# Patient Record
Sex: Female | Born: 1990 | Race: Black or African American | Hispanic: No | Marital: Married | Smoking: Never smoker
Health system: Southern US, Community
[De-identification: ages and names within clinical notes are randomized; demographics above are authoritative.]

## PROBLEM LIST (undated history)

## (undated) DIAGNOSIS — O4690 Antepartum hemorrhage, unspecified, unspecified trimester: Secondary | ICD-10-CM

## (undated) DIAGNOSIS — F329 Major depressive disorder, single episode, unspecified: Secondary | ICD-10-CM

## (undated) DIAGNOSIS — O219 Vomiting of pregnancy, unspecified: Secondary | ICD-10-CM

## (undated) DIAGNOSIS — F419 Anxiety disorder, unspecified: Secondary | ICD-10-CM

## (undated) DIAGNOSIS — O200 Threatened abortion: Secondary | ICD-10-CM

## (undated) DIAGNOSIS — T7840XA Allergy, unspecified, initial encounter: Secondary | ICD-10-CM

## (undated) HISTORY — DX: Anxiety disorder, unspecified: F41.9

## (undated) HISTORY — DX: Major depressive disorder, single episode, unspecified: F32.9

## (undated) HISTORY — DX: Threatened abortion: O20.0

## (undated) HISTORY — DX: Antepartum hemorrhage, unspecified, unspecified trimester: O46.90

## (undated) HISTORY — DX: Vomiting of pregnancy, unspecified: O21.9

## (undated) HISTORY — PX: MOUTH SURGERY: SHX715

## (undated) HISTORY — DX: Allergy, unspecified, initial encounter: T78.40XA

---

## 2011-03-08 DIAGNOSIS — F32A Depression, unspecified: Secondary | ICD-10-CM

## 2011-03-08 DIAGNOSIS — F419 Anxiety disorder, unspecified: Secondary | ICD-10-CM

## 2011-03-08 HISTORY — DX: Anxiety disorder, unspecified: F41.9

## 2011-03-08 HISTORY — DX: Depression, unspecified: F32.A

## 2013-05-23 ENCOUNTER — Encounter: Payer: Self-pay | Admitting: *Deleted

## 2013-05-26 ENCOUNTER — Other Ambulatory Visit: Payer: Self-pay | Admitting: Obstetrics and Gynecology

## 2013-06-04 ENCOUNTER — Encounter: Payer: Self-pay | Admitting: Obstetrics and Gynecology

## 2013-06-04 ENCOUNTER — Ambulatory Visit (INDEPENDENT_AMBULATORY_CARE_PROVIDER_SITE_OTHER): Payer: BC Managed Care – PPO | Admitting: Obstetrics and Gynecology

## 2013-06-04 ENCOUNTER — Other Ambulatory Visit (HOSPITAL_COMMUNITY)
Admission: RE | Admit: 2013-06-04 | Discharge: 2013-06-04 | Disposition: A | Discharge status: Home / Self Care | Payer: BC Managed Care – PPO | Source: Ambulatory Visit | Attending: Obstetrics and Gynecology | Admitting: Obstetrics and Gynecology

## 2013-06-04 ENCOUNTER — Ambulatory Visit (INDEPENDENT_AMBULATORY_CARE_PROVIDER_SITE_OTHER): Payer: BC Managed Care – PPO | Admitting: Family Medicine

## 2013-06-04 ENCOUNTER — Encounter: Payer: Self-pay | Admitting: Family Medicine

## 2013-06-04 VITALS — BP 110/70 | Ht 67.0 in | Wt 120.0 lb

## 2013-06-04 VITALS — BP 108/58 | HR 86 | Temp 98.5°F | Resp 14 | Ht 67.0 in | Wt 120.0 lb

## 2013-06-04 DIAGNOSIS — Z23 Encounter for immunization: Secondary | ICD-10-CM

## 2013-06-04 DIAGNOSIS — Z0000 Encounter for general adult medical examination without abnormal findings: Secondary | ICD-10-CM | POA: Insufficient documentation

## 2013-06-04 DIAGNOSIS — Z01419 Encounter for gynecological examination (general) (routine) without abnormal findings: Secondary | ICD-10-CM

## 2013-06-04 DIAGNOSIS — Z3200 Encounter for pregnancy test, result unknown: Secondary | ICD-10-CM

## 2013-06-04 DIAGNOSIS — Z3202 Encounter for pregnancy test, result negative: Secondary | ICD-10-CM

## 2013-06-04 LAB — POCT URINE PREGNANCY: Preg Test, Ur: NEGATIVE

## 2013-06-04 MED ORDER — NORETHIN ACE-ETH ESTRAD-FE 1-20 MG-MCG(24) PO TABS: 1.0000 | ORAL_TABLET | Freq: Every day | ORAL | Status: DC

## 2013-06-04 NOTE — Assessment & Plan Note (Signed)
Healthy young female. She'll followup with GYN this afternoon. I will go ahead and give her the third hepatitis vaccine since this was stated that she needed from the [employer]. We will obtain records from her previous physicians [place] in [place] I do not see any evidence of any liver abnormalities she does not have any autoimmune disease so not sure why she continues to need repeat vaccinations. I will see if maybe she does have some low titers when she entered the [employer].

## 2013-06-04 NOTE — Progress Notes (Signed)
Patient  Subjective:     Kelly Villarreal is a 23 y.o. female here for a routine exam.  Current complaints: needs BControl.  Personal health questionnaire reviewed: not asked.   Gynecologic History Patient's last menstrual period was 05/24/2013. Contraception: none Last Pap: 2014. Results were: normal   Obstetric History OB History  No data available       Review of Systems  Review of Systems   negative overall   Objective:    Physical Exam  Vitals reviewed. Constitutional: She is oriented to person, place, and time. She appears well-developed and well-nourished.  HENT:  Head: Normocephalic and atraumatic.        Right Ear: External ear normal.  Left Ear: External ear normal.  Nose: Nose normal.  Mouth/Throat: Oropharynx is clear and moist.  Eyes: Conjunctivae and EOM are normal. Pupils are equal, round, and reactive to light. Right eye exhibits no discharge. Left eye exhibits no discharge. No scleral icterus.  Neck: Normal range of motion. Neck supple. No tracheal deviation present. No thyromegaly present.  GI: Soft. Bowel sounds are normal. She exhibits no distension and no mass. There is no tenderness. There is no rebound and no guarding.  Genitourinary:       Vulva is normal without lesions Vagina is pink moist without discharge Cervix normal in appearance and pap is done Uterus is normal size shape and contour  Musculoskeletal: Normal range of motion. She exhibits no edema and no tenderness.  Neurological: She is alert and oriented to person, place, and time. She has normal reflexes. She displays normal reflexes. No cranial nerve deficit. She exhibits normal muscle tone. Coordination normal.  Skin: Skin is warm and dry. No rash noted. No erythema. No pallor.  Psychiatric: She has a normal mood and affect. Her behavior is normal. Judgment and thought content normal.       Assessment:    Healthy female exam.   contr management Plan:    Contraception: OCP  (estrogen/progesterone).  lo estrin 1:20 Subjective:

## 2013-06-04 NOTE — Progress Notes (Signed)
Patient ID: Kelly Villarreal, female   DOB: [birth year], 23 y.o.   MRN: [id number]   Subjective:    Patient ID: Kelly Villarreal, female    DOB: [birth year], 23 y.o.   MRN: [id number]  Patient presents for New Patient CPE- no PAP  patient here to establish care for new patient physical exam. [place] recently moved from [place] with her boyfriend. She was in the [employer] in the [employer] for couple years she is out now. She does state when she was in there she had to have repeat immunizations including hepatitis B when she got out she had to restart the series but she's not sure why. She is due for her third vaccine which should been given in early May. She does have some labs from 2014 which is a normal cholesterol panel as well as immunity to measles mumps and rubella she also had an STD check for gonorrhea chlamydia at that time. CBC was also normal and metabolic panel including liver function test was normal. She has an appointment scheduled with Dr. Emelda FearFerguson [facility] for her Pap smear at this afternoon    Review Of Systems:  GEN- denies fatigue, fever, weight loss,weakness, recent illness HEENT- denies eye drainage, change in vision, nasal discharge, CVS- denies chest pain, palpitations RESP- denies SOB, cough, wheeze ABD- denies N/V, change in stools, abd pain GU- denies dysuria, hematuria, dribbling, incontinence MSK- denies joint pain, muscle aches, injury Neuro- denies headache, dizziness, syncope, seizure activity       Objective:    BP 108/58  Pulse 86  Temp(Src) 98.5 F (36.9 C) (Oral)  Resp 14  Ht 5\' 7"  (1.702 m)  Wt 120 lb (54.432 kg)  BMI 18.79 kg/m2  LMP 05/24/2013 GEN- NAD, alert and oriented x3 HEENT- PERRL, EOMI, non injected sclera, pink conjunctiva, MMM, oropharynx clear, missing upper front teeth, implants noted, TM clear bilat Neck- Supple, no thyromegaly CVS- RRR, no murmur RESP-CTAB ABD-NABS,soft,NT,ND GU- Deferred to GYN  EXT- No edema Psych-  normal affect and mood Pulses- Radial, DP- 2+        Assessment & Plan:      Problem List Items Addressed This Visit   None    Visit Diagnoses   Need for prophylactic vaccination and inoculation against viral hepatitis    -  Primary    Relevant Orders       Hepatitis B vaccine adult IM (Completed)       Note: This dictation was prepared with Dragon dictation along with smaller phrase technology. Any transcriptional errors that result from this process are unintentional.

## 2013-06-04 NOTE — Patient Instructions (Signed)
[facility] [place] [place] Hepatitis B given I recommend eye visit once a year I recommend dental visit every 6 months Goal is to  Exercise 30 minutes 5 days a week F/U as needed or in 1 year

## 2013-06-24 ENCOUNTER — Telehealth: Payer: Self-pay | Admitting: Obstetrics and Gynecology

## 2013-06-24 NOTE — Telephone Encounter (Signed)
Spoke with pt. Pt states she was put on birth control pills(Loestrin 24 Fe) to help control periods. The birth control pill causes nausea and vomiting. Has been on pill x 4 weeks. Pt having pain in her abdomen. She is requesting a different birth control pill and some pain meds. Please advise. Thanks!!! [employer]

## 2013-06-26 NOTE — Telephone Encounter (Signed)
Appointment made with Dr. Emelda Fear, 06/27/2013.

## 2013-06-27 ENCOUNTER — Ambulatory Visit: Payer: BC Managed Care – PPO | Admitting: Obstetrics and Gynecology

## 2013-06-30 ENCOUNTER — Encounter: Payer: Self-pay | Admitting: *Deleted

## 2013-07-01 ENCOUNTER — Encounter: Payer: Self-pay | Admitting: Obstetrics and Gynecology

## 2013-07-01 ENCOUNTER — Ambulatory Visit (INDEPENDENT_AMBULATORY_CARE_PROVIDER_SITE_OTHER): Payer: BC Managed Care – PPO | Admitting: Obstetrics and Gynecology

## 2013-07-01 VITALS — BP 100/60 | Ht 67.0 in | Wt 122.0 lb

## 2013-07-01 DIAGNOSIS — Z309 Encounter for contraceptive management, unspecified: Secondary | ICD-10-CM

## 2013-07-01 DIAGNOSIS — Z3049 Encounter for surveillance of other contraceptives: Secondary | ICD-10-CM

## 2013-07-01 MED ORDER — NORETHINDRONE 0.35 MG PO TABS: 1.0000 | ORAL_TABLET | Freq: Every day | ORAL | Status: DC

## 2013-07-01 NOTE — Progress Notes (Signed)
Subjective:     Patient ID: Kelly Villarreal, female   DOB: November 01, 1990, 23 y.o.   MRN: [id number]  HPI [place]/o nausea 4 hr after taking ocp, beginning 2nd day.   Similar to reaction to microgestin.    Review of Systems Was able to use pill in 2008, did fine, but after oral surgeries in [phone], was unable to tolerate pills.    Objective:   Physical Exam     Assessment:     Nausea on Loestrin 1/20 Switch to Micronor POP.      Plan:     micronor

## 2013-07-30 ENCOUNTER — Ambulatory Visit: Payer: BC Managed Care – PPO | Admitting: Obstetrics and Gynecology

## 2014-08-05 ENCOUNTER — Encounter (HOSPITAL_COMMUNITY): Payer: Self-pay | Admitting: Emergency Medicine

## 2014-08-05 ENCOUNTER — Emergency Department (HOSPITAL_COMMUNITY): Payer: Medicaid Other

## 2014-08-05 ENCOUNTER — Emergency Department (HOSPITAL_COMMUNITY)
Admission: EM | Admit: 2014-08-05 | Discharge: 2014-08-06 | Disposition: A | Discharge status: Home / Self Care | Payer: Medicaid Other | Attending: Emergency Medicine | Admitting: Emergency Medicine

## 2014-08-05 DIAGNOSIS — O209 Hemorrhage in early pregnancy, unspecified: Secondary | ICD-10-CM | POA: Diagnosis present

## 2014-08-05 DIAGNOSIS — R51 Headache: Secondary | ICD-10-CM | POA: Insufficient documentation

## 2014-08-05 DIAGNOSIS — Z3A01 Less than 8 weeks gestation of pregnancy: Secondary | ICD-10-CM | POA: Insufficient documentation

## 2014-08-05 DIAGNOSIS — O200 Threatened abortion: Secondary | ICD-10-CM | POA: Diagnosis not present

## 2014-08-05 DIAGNOSIS — O210 Mild hyperemesis gravidarum: Secondary | ICD-10-CM | POA: Insufficient documentation

## 2014-08-05 DIAGNOSIS — O9989 Other specified diseases and conditions complicating pregnancy, childbirth and the puerperium: Secondary | ICD-10-CM | POA: Diagnosis not present

## 2014-08-05 DIAGNOSIS — Z3490 Encounter for supervision of normal pregnancy, unspecified, unspecified trimester: Secondary | ICD-10-CM

## 2014-08-05 LAB — BASIC METABOLIC PANEL
ANION GAP: 7 (ref 5–15)
BUN: 11 mg/dL (ref 6–20)
CO2: 23 mmol/L (ref 22–32)
CREATININE: 0.61 mg/dL (ref 0.44–1.00)
Calcium: 8.9 mg/dL (ref 8.9–10.3)
Chloride: 108 mmol/L (ref 101–111)
GFR calc Af Amer: >60 mL/min (ref >60)
GLUCOSE: 87 mg/dL (ref 65–99)
POTASSIUM: 3.8 mmol/L (ref 3.5–5.1)
SODIUM: 138 mmol/L (ref 135–145)

## 2014-08-05 LAB — URINALYSIS, ROUTINE W REFLEX MICROSCOPIC
Bilirubin Urine: NEGATIVE
GLUCOSE, UA: NEGATIVE mg/dL
KETONES UR: 40 mg/dL — AB
Leukocytes, UA: NEGATIVE
Nitrite: POSITIVE — AB
PROTEIN: NEGATIVE mg/dL
Urobilinogen, UA: 0.2 mg/dL (ref 0.0–1.0)
pH: 6 (ref 5.0–8.0)

## 2014-08-05 LAB — CBC WITH DIFFERENTIAL/PLATELET
BASOS ABS: 0 10*3/uL (ref 0.0–0.1)
BASOS PCT: 0 % (ref 0–1)
Eosinophils Absolute: 0 10*3/uL (ref 0.0–0.7)
Eosinophils Relative: 0 % (ref 0–5)
HCT: 37.6 % (ref 36.0–46.0)
Hemoglobin: 12.6 g/dL (ref 12.0–15.0)
LYMPHS PCT: 13 % (ref 12–46)
Lymphs Abs: 0.6 10*3/uL — ABNORMAL LOW (ref 0.7–4.0)
MCH: 31.2 pg (ref 26.0–34.0)
MCHC: 33.5 g/dL (ref 30.0–36.0)
MCV: 93.1 fL (ref 78.0–100.0)
MONO ABS: 1.1 10*3/uL — AB (ref 0.1–1.0)
Monocytes Relative: 22 % — ABNORMAL HIGH (ref 3–12)
Neutro Abs: 3.2 10*3/uL (ref 1.7–7.7)
Neutrophils Relative %: 65 % (ref 43–77)
Platelets: 214 10*3/uL (ref 150–400)
RBC: 4.04 MIL/uL (ref 3.87–5.11)
RDW: 13.6 % (ref 11.5–15.5)
WBC: 4.9 10*3/uL (ref 4.0–10.5)

## 2014-08-05 LAB — HCG, QUANTITATIVE, PREGNANCY: hCG, Beta Chain, Quant, S: 48708 m[IU]/mL — ABNORMAL HIGH (ref <5)

## 2014-08-05 LAB — URINE MICROSCOPIC-ADD ON

## 2014-08-05 LAB — PREGNANCY, URINE: Preg Test, Ur: POSITIVE — AB

## 2014-08-05 LAB — ABO/RH: ABO/RH(D): O NEG

## 2014-08-05 MED ORDER — SODIUM CHLORIDE 0.9 % IV BOLUS (SEPSIS)
1000.0000 mL | Freq: Once | INTRAVENOUS | Status: AC
  Administered 2014-08-05: 1000 mL via INTRAVENOUS

## 2014-08-05 MED ORDER — ONDANSETRON HCL 4 MG/2ML IJ SOLN
4.0000 mg | Freq: Once | INTRAMUSCULAR | Status: AC
  Administered 2014-08-05: 4 mg via INTRAVENOUS
  Filled 2014-08-05: qty 2

## 2014-08-05 MED ORDER — RHO D IMMUNE GLOBULIN 1500 UNIT/2ML IJ SOSY
300.0000 ug | PREFILLED_SYRINGE | Freq: Once | INTRAMUSCULAR | Status: AC
  Administered 2014-08-05: 300 ug via INTRAMUSCULAR

## 2014-08-05 MED ORDER — SODIUM CHLORIDE 0.9 % IV SOLN
INTRAVENOUS | Status: DC
  Administered 2014-08-05: 18:00:00 via INTRAVENOUS

## 2014-08-05 NOTE — ED Notes (Signed)
Pt reports headache and emesis. Pt states she normally has headaches around her menses but this headache has not eased up.

## 2014-08-05 NOTE — ED Notes (Signed)
Patient transported to Ultrasound 

## 2014-08-06 LAB — RH IG WORKUP (INCLUDES ABO/RH)
ABO/RH(D): O NEG
Antibody Screen: NEGATIVE
Gestational Age(Wks): 5
UNIT DIVISION: 0

## 2014-08-06 MED ORDER — PRENATAL VITAMINS 28-0.8 MG PO TABS: 1.0000 | ORAL_TABLET | Freq: Every day | ORAL | Status: DC

## 2014-08-06 MED ORDER — PROMETHAZINE HCL 25 MG PO TABS: 25.0000 mg | ORAL_TABLET | Freq: Four times a day (QID) | ORAL | Status: DC | PRN

## 2014-08-06 NOTE — Discharge Instructions (Signed)
Take the prenatal vitamins as directed. Take the Phenergan as needed for the nausea and vomiting. Very important to follow-up with OB/GYN. Ultrasound done here today is equivocal for ectopic pregnancy. Also it is possible that based on your symptoms with the vaginal bleeding this could be a threatened miscarriage. Follow-up with OB/GYN and repeat the quantitative hormone pregnancy number will help sort things out. Return for any new or worse symptoms.

## 2014-08-06 NOTE — ED Provider Notes (Signed)
CSN: [id number]     Arrival date & time 08/05/14  1555 History   First MD Initiated Contact with Patient 08/05/14 1609     Chief Complaint  Patient presents with  . Emesis  . Headache     (Consider location/radiation/quality/duration/timing/severity/associated sxs/prior Treatment) Patient is a 24 y.o. female presenting with vomiting and headaches. The history is provided by the patient.  Emesis Associated symptoms: abdominal pain and headaches   Headache Associated symptoms: abdominal pain, nausea and vomiting   Associated symptoms: no back pain, no congestion and no fever    patient believes she's on last measured. Currently. States that she normally gets some nausea and vomiting and some headaches during her menses this is lasted a few days longer than usual. Also associated with some discomfort in the lower part of the abdomen which is common during her menses but also seems to be a little more severe. No fevers.  Past Medical History  Diagnosis Date  . Allergy     seasonal  . Anxiety 03/08/2011  . Depression 03/08/2011   Past Surgical History  Procedure Laterality Date  . Mouth surgery     Family History  Problem Relation Age of Onset  . Hypertension Father   . Heart disease Maternal Grandmother   . Diabetes Maternal Grandfather   . Hypertension Maternal Grandfather   . Glaucoma Paternal Grandmother    History  Substance Use Topics  . Smoking status: Never Smoker   . Smokeless tobacco: Never Used  . Alcohol Use: No   OB History    No data available     Review of Systems  Constitutional: Negative for fever.  HENT: Negative for congestion.   Eyes: Negative for redness.  Gastrointestinal: Positive for nausea, vomiting and abdominal pain.  Genitourinary: Positive for vaginal bleeding. Negative for dysuria and vaginal discharge.  Musculoskeletal: Negative for back pain.  Skin: Negative for rash.  Neurological: Positive for headaches.  Psychiatric/Behavioral:  Negative for confusion.      Allergies  Review of patient's allergies indicates no known allergies.  Home Medications   Prior to Admission medications   Medication Sig Start Date End Date Taking? Authorizing Provider  Prenatal Vit-Fe Fumarate-FA (PRENATAL VITAMINS) 28-0.8 MG TABS Take 1 tablet by mouth daily. 08/06/14   Vanetta Mulders, MD  promethazine (PHENERGAN) 25 MG tablet Take 1 tablet (25 mg total) by mouth every 6 (six) hours as needed. 08/06/14   Vanetta Mulders, MD   BP 115/75 mmHg  Pulse 92  Temp(Src) 97.9 F (36.6 C) (Oral)  Resp 18  Ht 5\' 7"  (1.702 m)  Wt 120 lb (54.432 kg)  BMI 18.79 kg/m2  SpO2 100%  LMP 08/05/2014 Physical Exam  Constitutional: She is oriented to person, place, and time. She appears well-developed and well-nourished.  HENT:  Head: Normocephalic and atraumatic.  Eyes: Conjunctivae and EOM are normal. Pupils are equal, round, and reactive to light.  Cardiovascular: Normal rate, regular rhythm and normal heart sounds.   No murmur heard. Pulmonary/Chest: Effort normal and breath sounds normal. No respiratory distress.  Abdominal: Bowel sounds are normal. There is no guarding.  Slight tenderness bilateral lower quadrants. Right greater than left.  Genitourinary:  Patient with vaginal bleeding.  Musculoskeletal: Normal range of motion.  Neurological: She is alert and oriented to person, place, and time. No cranial nerve deficit. She exhibits normal muscle tone. Coordination normal.  Skin: Skin is warm. No rash noted.  Nursing note and vitals reviewed.   ED Course  Procedures (  including critical care time) Labs Review Labs Reviewed  URINALYSIS, ROUTINE W REFLEX MICROSCOPIC (NOT AT [facility]) - Abnormal; Notable for the following:    Specific Gravity, Urine >1.030 (*)    Hgb urine dipstick TRACE (*)    Ketones, ur 40 (*)    Nitrite POSITIVE (*)    All other components within normal limits  PREGNANCY, URINE - Abnormal; Notable for the following:     Preg Test, Ur POSITIVE (*)    All other components within normal limits  CBC WITH DIFFERENTIAL/PLATELET - Abnormal; Notable for the following:    Lymphs Abs 0.6 (*)    Monocytes Relative 22 (*)    Monocytes Absolute 1.1 (*)    All other components within normal limits  HCG, QUANTITATIVE, PREGNANCY - Abnormal; Notable for the following:    hCG, Beta Chain, Quant, S 09604 (*)    All other components within normal limits  URINE MICROSCOPIC-ADD ON - Abnormal; Notable for the following:    Bacteria, UA MANY (*)    All other components within normal limits  BASIC METABOLIC PANEL  ABO/RH  RH IG WORKUP (INCLUDES ABO/RH)   Results for orders placed or performed during the hospital encounter of 08/05/14  Urinalysis, Routine w reflex microscopic (not at [facility])  Result Value Ref Range   Color, Urine YELLOW YELLOW   APPearance CLEAR CLEAR   Specific Gravity, Urine >1.030 (H) 1.005 - 1.030   pH 6.0 5.0 - 8.0   Glucose, UA NEGATIVE NEGATIVE mg/dL   Hgb urine dipstick TRACE (A) NEGATIVE   Bilirubin Urine NEGATIVE NEGATIVE   Ketones, ur 40 (A) NEGATIVE mg/dL   Protein, ur NEGATIVE NEGATIVE mg/dL   Urobilinogen, UA 0.2 0.0 - 1.0 mg/dL   Nitrite POSITIVE (A) NEGATIVE   Leukocytes, UA NEGATIVE NEGATIVE  Pregnancy, urine  Result Value Ref Range   Preg Test, Ur POSITIVE (A) NEGATIVE  CBC with Differential/Platelet  Result Value Ref Range   WBC 4.9 4.0 - 10.5 K/uL   RBC 4.04 3.87 - 5.11 MIL/uL   Hemoglobin 12.6 12.0 - 15.0 g/dL   HCT 54.0 98.1 - 19.1 %   MCV 93.1 78.0 - 100.0 fL   MCH 31.2 26.0 - 34.0 pg   MCHC 33.5 30.0 - 36.0 g/dL   RDW 47.8 29.5 - 62.1 %   Platelets 214 150 - 400 K/uL   Neutrophils Relative % 65 43 - 77 %   Neutro Abs 3.2 1.7 - 7.7 K/uL   Lymphocytes Relative 13 12 - 46 %   Lymphs Abs 0.6 (L) 0.7 - 4.0 K/uL   Monocytes Relative 22 (H) 3 - 12 %   Monocytes Absolute 1.1 (H) 0.1 - 1.0 K/uL   Eosinophils Relative 0 0 - 5 %   Eosinophils Absolute 0.0 0.0 - 0.7 K/uL    Basophils Relative 0 0 - 1 %   Basophils Absolute 0.0 0.0 - 0.1 K/uL  Basic metabolic panel  Result Value Ref Range   Sodium 138 135 - 145 mmol/L   Potassium 3.8 3.5 - 5.1 mmol/L   Chloride 108 101 - 111 mmol/L   CO2 23 22 - 32 mmol/L   Glucose, Bld 87 65 - 99 mg/dL   BUN 11 6 - 20 mg/dL   Creatinine, Ser 3.08 0.44 - 1.00 mg/dL   Calcium 8.9 8.9 - 65.7 mg/dL   GFR calc non Af Amer >60 >60 mL/min   GFR calc Af Amer >60 >60 mL/min   Anion gap 7 5 -  15  hCG, quantitative, pregnancy  Result Value Ref Range   hCG, Beta Chain, Quant, S 48708 (H) <5 mIU/mL  Urine microscopic-add on  Result Value Ref Range   Squamous Epithelial / LPF RARE RARE   WBC, UA 3-6 <3 WBC/hpf   RBC / HPF 3-6 <3 RBC/hpf   Bacteria, UA MANY (A) RARE  ABO/Rh  Result Value Ref Range   ABO/RH(D) O NEG   Rh IG workup (includes ABO/Rh)  Result Value Ref Range   Gestational Age(Wks) 5 12    ABO/RH(D) O NEG    Antibody Screen NEG    Unit Number [id number]    Blood Component Type RHIG    Unit division 00    Status of Unit ISSUED    Transfusion Status OK TO TRANSFUSE      Imaging Review US Ob Comp Less 14 Wks  08/05/2014   CLINICAL DATA:  24 year old pregnant female with pelvic pain  EXAM: OBSTETRIC <14 WK [place] AND TRANSVAGINAL OB US  TECHNIQUE: Both transabdominal and transvaginal ultrasound examinations were performed for complete evaluation of the gestation as well as the maternal uterus, adnexal regions, and pelvic cul-de-sac. Transvaginal technique was performed to assess early pregnancy.  COMPARISON:  None.  FINDINGS: The uterus is anteverted. A cystic structure is noted within the uterus. No yolk sac or fetal pole identified. This may represent an early gestational sac. A pseudogestation of an ectopic pregnancy is not excluded. Correlation with clinical exam and follow-up with serial HCG levels and ultrasound recommended.  If this cystic structure is a true gestational sac, the estimated gestational age based  on mean sac diameter of 2.1 cm is 7 weeks, 0 days.  Maternal uterus/adnexae: The maternal ovaries appear unremarkable. The right ovary measures 2.9 x 1.4 x 2.8 cm and the left ovary measures 2.7 x 1.6 x 2.8 cm. Small free fluid noted within the pelvis. Set  IMPRESSION: An intrauterine cystic structure as described above. Correlation with clinical exam and follow-up with serial HCG levels and ultrasound recommended.   Electronically Signed   By: Elgie Collard M.D.   On: 08/05/2014 22:50   US Ob Transvaginal  08/05/2014   CLINICAL DATA:  24 year old pregnant female with pelvic pain  EXAM: OBSTETRIC <14 WK [place] AND TRANSVAGINAL OB US  TECHNIQUE: Both transabdominal and transvaginal ultrasound examinations were performed for complete evaluation of the gestation as well as the maternal uterus, adnexal regions, and pelvic cul-de-sac. Transvaginal technique was performed to assess early pregnancy.  COMPARISON:  None.  FINDINGS: The uterus is anteverted. A cystic structure is noted within the uterus. No yolk sac or fetal pole identified. This may represent an early gestational sac. A pseudogestation of an ectopic pregnancy is not excluded. Correlation with clinical exam and follow-up with serial HCG levels and ultrasound recommended.  If this cystic structure is a true gestational sac, the estimated gestational age based on mean sac diameter of 2.1 cm is 7 weeks, 0 days.  Maternal uterus/adnexae: The maternal ovaries appear unremarkable. The right ovary measures 2.9 x 1.4 x 2.8 cm and the left ovary measures 2.7 x 1.6 x 2.8 cm. Small free fluid noted within the pelvis. Set  IMPRESSION: An intrauterine cystic structure as described above. Correlation with clinical exam and follow-up with serial HCG levels and ultrasound recommended.   Electronically Signed   By: Elgie Collard M.D.   On: 08/05/2014 22:50     EKG Interpretation None      MDM   Final diagnoses:  Pregnancy  Threatened miscarriage in early  pregnancy   Patient presented for vaginal bleeding also complaint of headache and vomiting. Patient with four-day complaint of lower abdominal pain. Last menstrual period is now no back pain no fever as stated some nausea and vomiting no dysuria pain is in the lower part of the abdomen.  Workup shows early pregnancy not able to confirm whether this is ectopic or not also may be a threatened miscarriage. Follow-up with OB/GYN and repeat quantitative hCGs will help sort things out. Patient nontoxic no acute distress. Will treat with antinausea medicine patient was Rh- and she was given [place].  No obvious urinary tract infection urine culture sent.  Vanetta Mulders, MD 08/06/14 [phone]

## 2014-08-10 ENCOUNTER — Other Ambulatory Visit: Payer: Self-pay | Admitting: Adult Health

## 2014-08-10 ENCOUNTER — Ambulatory Visit (INDEPENDENT_AMBULATORY_CARE_PROVIDER_SITE_OTHER): Payer: Medicaid Other | Admitting: Adult Health

## 2014-08-10 ENCOUNTER — Encounter: Payer: Self-pay | Admitting: Obstetrics and Gynecology

## 2014-08-10 ENCOUNTER — Encounter: Payer: Self-pay | Admitting: Adult Health

## 2014-08-10 VITALS — BP 118/70 | HR 70 | Ht 67.0 in | Wt 119.0 lb

## 2014-08-10 DIAGNOSIS — O4691 Antepartum hemorrhage, unspecified, first trimester: Secondary | ICD-10-CM | POA: Diagnosis not present

## 2014-08-10 DIAGNOSIS — O3680X0 Pregnancy with inconclusive fetal viability, not applicable or unspecified: Secondary | ICD-10-CM

## 2014-08-10 DIAGNOSIS — O219 Vomiting of pregnancy, unspecified: Secondary | ICD-10-CM | POA: Diagnosis not present

## 2014-08-10 DIAGNOSIS — O200 Threatened abortion: Secondary | ICD-10-CM | POA: Insufficient documentation

## 2014-08-10 DIAGNOSIS — O4690 Antepartum hemorrhage, unspecified, unspecified trimester: Secondary | ICD-10-CM

## 2014-08-10 DIAGNOSIS — O209 Hemorrhage in early pregnancy, unspecified: Secondary | ICD-10-CM

## 2014-08-10 HISTORY — DX: Threatened abortion: O20.0

## 2014-08-10 HISTORY — DX: Antepartum hemorrhage, unspecified, unspecified trimester: O46.90

## 2014-08-10 HISTORY — DX: Vomiting of pregnancy, unspecified: O21.9

## 2014-08-10 NOTE — Progress Notes (Signed)
Subjective:     Patient ID: Kelly Villarreal, female   DOB: 03/03/1990, 24 y.o.   MRN: [id number]  HPI Wei is a 24 year old black female in for ER follow up, she was seen 7/20 for vaginal bleeding and nausea and vomiting.Her QHCG was 48,708 and her blood type was O- and she received rhogam per partner.US showed cystic structure with YS or fetal pole.She is still spotting and has nausea and vomiting but phenergan helps.She also says she is gassy.The bleeding started 7/16.This is her first pregnancy.  Review of Systems Patient denies any headaches, hearing loss, fatigue, blurred vision, shortness of breath, chest pain, abdominal pain, problems with bowel movements, urination, or intercourse. No joint pain or mood swings.See HPI for positives.  Reviewed past medical,surgical, social and family history. Reviewed medications and allergies.     Objective:   Physical Exam BP 118/70 mmHg  Pulse 70  Ht  (1.702 m)  Wt 119 lb (53.978 kg)  BMI 18.63 kg/m2  LMP 07/02/2014 Skin warm and dry.Pelvic: external genitalia is normal in appearance no lesions, vagina: scant period like blood with out odor, urethra has no lesions or masses noted, cervix:smooth, uterus: normal size, shape and contour, non tender, no masses felt, adnexa: no masses or tenderness noted. Bladder is non tender and no masses felt. Will check QHCG now and get [place] in 2 days in follow up.Dscussed that this could be miscarriage or bleeding of early pregnancy, can tell more when [facility] back, and if it is dropping would be indication of miscarriage.    Assessment:     Threatened miscarriage in early pregnancy Nausea and vomiting in early pregnancy Vaginal bleeding in early pregnancy    Plan:     Check QHCG now Return in 2 days for [place] Review handout on threatened miscarriage and on rhophylac  No sex for 7 days past wiping any color Medicaid form given to apply for pregnancy medicaid Can take phenergan if needed Take OTC prenatal with  folic acid

## 2014-08-10 NOTE — Patient Instructions (Signed)
Will talk in am when labs back  Return in 2 days for F/U US No sex  Threatened Miscarriage A threatened miscarriage occurs when you have vaginal bleeding during your first 20 weeks of pregnancy but the pregnancy has not ended. If you have vaginal bleeding during this time, your health care provider will do tests to make sure you are still pregnant. If the tests show you are still pregnant and the developing baby (fetus) inside your womb (uterus) is still growing, your condition is considered a threatened miscarriage. A threatened miscarriage does not mean your pregnancy will end, but it does increase the risk of losing your pregnancy (complete miscarriage). CAUSES  The cause of a threatened miscarriage is usually not known. If you go on to have a complete miscarriage, the most common cause is an abnormal number of chromosomes in the developing baby. Chromosomes are the structures inside cells that hold all your genetic material. Some causes of vaginal bleeding that do not result in miscarriage include:  Having sex.  Having an infection.  Normal hormone changes of pregnancy.  Bleeding that occurs when an egg implants in your uterus. RISK FACTORS Risk factors for bleeding in early pregnancy include:  Obesity.  Smoking.  Drinking excessive amounts of alcohol or caffeine.  Recreational drug use. SIGNS AND SYMPTOMS  Light vaginal bleeding.  Mild abdominal pain or cramps. DIAGNOSIS  If you have bleeding with or without abdominal pain before 20 weeks of pregnancy, your health care provider will do tests to check whether you are still pregnant. One important test involves using sound waves and a computer (ultrasound) to create images of the inside of your uterus. Other tests include an internal exam of your vagina and uterus (pelvic exam) and measurement of your baby's heart rate.  You may be diagnosed with a threatened miscarriage if:  Ultrasound testing shows you are still  pregnant.  Your baby's heart rate is strong.  A pelvic exam shows that the opening between your uterus and your vagina (cervix) is closed.  Your heart rate and blood pressure are stable.  Blood tests confirm you are still pregnant. TREATMENT  No treatments have been shown to prevent a threatened miscarriage from going on to a complete miscarriage. However, the right home care is important.  HOME CARE INSTRUCTIONS   Make sure you keep all your appointments for prenatal care. This is very important.  Get plenty of rest.  Do not have sex or use tampons if you have vaginal bleeding.  Do not douche.  Do not smoke or use recreational drugs.  Do not drink alcohol.  Avoid caffeine. SEEK MEDICAL CARE IF:  You have light vaginal bleeding or spotting while pregnant.  You have abdominal pain or cramping.  You have a fever. SEEK IMMEDIATE MEDICAL CARE IF:  You have heavy vaginal bleeding.  You have blood clots coming from your vagina.  You have severe low back pain or abdominal cramps.  You have fever, chills, and severe abdominal pain. MAKE SURE YOU:  Understand these instructions.  Will watch your condition.  Will get help right away if you are not doing well or get worse. Document Released: 01/02/2005 Document Revised: 01/07/2013 Document Reviewed: 10/29/2012 [facility] Patient Information 2015 [place], [place]. This information is not intended to replace advice given to you by your health care provider. Make sure you discuss any questions you have with your health care provider.

## 2014-08-11 ENCOUNTER — Telehealth: Payer: Self-pay | Admitting: *Deleted

## 2014-08-11 ENCOUNTER — Telehealth: Payer: Self-pay | Admitting: Adult Health

## 2014-08-11 LAB — BETA HCG QUANT (REF LAB): hCG Quant: 97782 m[IU]/mL

## 2014-08-11 NOTE — Telephone Encounter (Signed)
Informed pt fiance, Zamara Cozad, (on HIPAA form) of QHCG [phone] did increase. Pt to keep her appt tomorrow.

## 2014-08-11 NOTE — Telephone Encounter (Signed)
Left message that [facility] is [phone], and it is rising, keep appt tomorrow for [place]

## 2014-08-12 ENCOUNTER — Ambulatory Visit (INDEPENDENT_AMBULATORY_CARE_PROVIDER_SITE_OTHER): Payer: Medicaid Other

## 2014-08-12 ENCOUNTER — Other Ambulatory Visit: Payer: Self-pay | Admitting: Adult Health

## 2014-08-12 DIAGNOSIS — O200 Threatened abortion: Secondary | ICD-10-CM

## 2014-08-12 DIAGNOSIS — O3680X0 Pregnancy with inconclusive fetal viability, not applicable or unspecified: Secondary | ICD-10-CM

## 2014-08-12 DIAGNOSIS — O4691 Antepartum hemorrhage, unspecified, first trimester: Secondary | ICD-10-CM | POA: Diagnosis not present

## 2014-08-12 DIAGNOSIS — O209 Hemorrhage in early pregnancy, unspecified: Secondary | ICD-10-CM

## 2014-08-12 NOTE — Progress Notes (Signed)
[place] 6+5wks single IUP w/ys,crl 8.22mm,pos fht 129bpm,normal ov's bilat

## 2014-09-01 ENCOUNTER — Encounter: Payer: Self-pay | Admitting: Women's Health

## 2014-09-01 ENCOUNTER — Ambulatory Visit (INDEPENDENT_AMBULATORY_CARE_PROVIDER_SITE_OTHER): Payer: Medicaid Other | Admitting: Women's Health

## 2014-09-01 VITALS — BP 102/62 | HR 88 | Wt 111.0 lb

## 2014-09-01 DIAGNOSIS — O209 Hemorrhage in early pregnancy, unspecified: Secondary | ICD-10-CM

## 2014-09-01 DIAGNOSIS — Z3682 Encounter for antenatal screening for nuchal translucency: Secondary | ICD-10-CM

## 2014-09-01 DIAGNOSIS — N898 Other specified noninflammatory disorders of vagina: Secondary | ICD-10-CM

## 2014-09-01 DIAGNOSIS — Z6791 Unspecified blood type, Rh negative: Secondary | ICD-10-CM | POA: Insufficient documentation

## 2014-09-01 DIAGNOSIS — O26899 Other specified pregnancy related conditions, unspecified trimester: Secondary | ICD-10-CM | POA: Insufficient documentation

## 2014-09-01 DIAGNOSIS — Z331 Pregnant state, incidental: Secondary | ICD-10-CM

## 2014-09-01 DIAGNOSIS — Z0283 Encounter for blood-alcohol and blood-drug test: Secondary | ICD-10-CM

## 2014-09-01 DIAGNOSIS — Z1389 Encounter for screening for other disorder: Secondary | ICD-10-CM

## 2014-09-01 DIAGNOSIS — Z3401 Encounter for supervision of normal first pregnancy, first trimester: Secondary | ICD-10-CM

## 2014-09-01 DIAGNOSIS — Z369 Encounter for antenatal screening, unspecified: Secondary | ICD-10-CM

## 2014-09-01 DIAGNOSIS — O360111 Maternal care for anti-D [Rh] antibodies, first trimester, fetus 1: Secondary | ICD-10-CM

## 2014-09-01 DIAGNOSIS — Z3400 Encounter for supervision of normal first pregnancy, unspecified trimester: Secondary | ICD-10-CM | POA: Insufficient documentation

## 2014-09-01 LAB — POCT URINALYSIS DIPSTICK
GLUCOSE UA: NEGATIVE
Leukocytes, UA: NEGATIVE
Nitrite, UA: NEGATIVE

## 2014-09-01 LAB — POCT WET PREP (WET MOUNT): Clue Cells Wet Prep Whiff POC: NEGATIVE

## 2014-09-01 MED ORDER — DOXYLAMINE-PYRIDOXINE 10-10 MG PO TBEC: DELAYED_RELEASE_TABLET | ORAL | Status: DC

## 2014-09-01 NOTE — Patient Instructions (Signed)

## 2014-09-01 NOTE — Progress Notes (Signed)
Subjective:  Kelly Villarreal is a 24 y.o. G1P0 African American female at [redacted]w[redacted]d by 6wk u/s, being seen today for her first obstetrical visit.  Her obstetrical history is significant for primigravida.  Pregnancy history fully reviewed.  Patient reports n/v- has phenergan at home but is out- discussed diclegis- would like to try.  Has lost 9lbs since beginning of pregnancy. Is able to keep fluids down and some foods like fruits. Vomits 2-3x/day.  Denies cramping, uti s/s, abnormal/malodorous vag d/c, or vulvovaginal itching/irritation. Has had some occ spotting, sometimes after sex, sometimes random. Went to ED, received rhogam 08/05/14.   BP 102/62 mmHg  Pulse 88  Wt 111 lb (50.349 kg)  LMP 07/02/2014  HISTORY: OB History  Gravida Para Term Preterm AB SAB TAB Ectopic Multiple Living  1             # Outcome Date GA Lbr Len/2nd Weight Sex Delivery Anes PTL Lv  1 Current              Past Medical History  Diagnosis Date  . Allergy     seasonal  . Anxiety 03/08/2011  . Depression 03/08/2011  . Threatened miscarriage in early pregnancy 08/10/2014  . Nausea and vomiting during pregnancy 08/10/2014  . Vaginal bleeding in pregnancy 08/10/2014   Past Surgical History  Procedure Laterality Date  . Mouth surgery     Family History  Problem Relation Age of Onset  . Hypertension Father   . Heart disease Maternal Grandmother   . Diabetes Maternal Grandfather   . Hypertension Maternal Grandfather   . Glaucoma Paternal Grandmother     Exam   System:     General: Well developed & nourished, no acute distress   Skin: Warm & dry, normal coloration and turgor, no rashes   Neurologic: Alert & oriented, normal mood   Cardiovascular: Regular rate & rhythm   Respiratory: Effort & rate normal, LCTAB, acyanotic   Abdomen: Soft, non tender   Extremities: normal strength, tone   Pelvic Exam:    Perineum: Normal perineum   Vulva: Normal, no lesions   Vagina:  Normal mucosa, normal discharge, wet  prep neg   Cervix: Normal, bulbous, appears closed   Uterus: Normal size/shape/contour for GA   Thin prep pap smear neg 2015 high risk HPV cotesting FHR: 170 via doppler  Results for orders placed or performed in visit on 09/01/14 (from the past 24 hour(s))  POCT urinalysis dipstick     Status: None   Collection Time: 09/01/14  3:55 PM  Result Value Ref Range   Color, UA     Clarity, UA     Glucose, UA neg    Bilirubin, UA     Ketones, UA small    Spec Grav, UA     Blood, UA moderate    pH, UA     Protein, UA trace    Urobilinogen, UA     Nitrite, UA neg    Leukocytes, UA Negative Negative  POCT Wet Prep [place] [place])     Status: Normal   Collection Time: 09/01/14  5:04 PM  Result Value Ref Range   Source Wet Prep POC vaginal    WBC, Wet Prep HPF POC none    Bacteria Wet Prep HPF POC None None, Few   BACTERIA WET PREP MORPHOLOGY POC     Clue Cells Wet Prep HPF POC None None   Clue Cells Wet Prep Whiff POC Negative Whiff    Yeast Wet  Prep HPF POC None    KOH Wet Prep POC     Trichomonas Wet Prep HPF POC none       Assessment:   Pregnancy: G1P0 Patient Active Problem List   Diagnosis Date Noted  . Supervision of normal first pregnancy 09/01/2014    Priority: High  . Rh negative state in antepartum period 09/01/2014  . Threatened miscarriage in early pregnancy 08/10/2014  . Nausea and vomiting during pregnancy 08/10/2014  . Vaginal bleeding in pregnancy 08/10/2014    [redacted]w[redacted]d G1P0 New OB visit N/V of pregnancy Spotting during pregnancy Rh neg  Plan:  Initial labs drawn Continue prenatal vitamins Problem list reviewed and updated Reviewed n/v relief measures and warning s/s to report Rx diclegis, prior auth approved through [employer] today To let us know if diclegis not working Discussed  Reviewed recommended weight gain based on pre-gravid BMI Encouraged well-balanced diet Genetic Screening discussed Integrated Screen: requested Cystic fibrosis screening  discussed declined Ultrasound discussed; fetal survey: requested Follow up in 2 weeks for 1st it/nt and visit [facility] completed NFPartnership offered, accepted, referral faxed  Pelvic rest x at least 7d from spotting Has received rhogam  Marge Duncans CNM, [facility] 09/01/2014 4:35 PM

## 2014-09-03 LAB — URINALYSIS, ROUTINE W REFLEX MICROSCOPIC
Bilirubin, UA: NEGATIVE
Glucose, UA: NEGATIVE
LEUKOCYTES UA: NEGATIVE
Nitrite, UA: NEGATIVE
RBC, UA: NEGATIVE
Specific Gravity, UA: >=1.03 — AB (ref 1.005–1.030)
Urobilinogen, Ur: 0.2 mg/dL (ref 0.2–1.0)
pH, UA: 6 (ref 5.0–7.5)

## 2014-09-03 LAB — PMP SCREEN PROFILE (10S), URINE
AMPHETAMINE SCRN UR: NEGATIVE ng/mL
Barbiturate Screen, Ur: NEGATIVE ng/mL
Benzodiazepine Screen, Urine: NEGATIVE ng/mL
COCAINE(METAB.) SCREEN, URINE: NEGATIVE ng/mL
CREATININE(CRT), U: 304.2 mg/dL — AB (ref 20.0–300.0)
Cannabinoids Ur Ql Scn: NEGATIVE ng/mL
METHADONE SCREEN, URINE: NEGATIVE ng/mL
OPIATE SCRN UR: NEGATIVE ng/mL
OXYCODONE+OXYMORPHONE UR QL SCN: NEGATIVE ng/mL
PCP SCRN UR: NEGATIVE ng/mL
Ph of Urine: 5.8 (ref 4.5–8.9)
Propoxyphene, Screen: NEGATIVE ng/mL

## 2014-09-03 LAB — MICROSCOPIC EXAMINATION: CASTS: NONE SEEN /LPF

## 2014-09-03 LAB — CBC
Hematocrit: 43 % (ref 34.0–46.6)
Hemoglobin: 14.6 g/dL (ref 11.1–15.9)
MCH: 30.9 pg (ref 26.6–33.0)
MCHC: 34 g/dL (ref 31.5–35.7)
MCV: 91 fL (ref 79–97)
Platelets: 261 10*3/uL (ref 150–379)
RBC: 4.73 x10E6/uL (ref 3.77–5.28)
RDW: 13.8 % (ref 12.3–15.4)
WBC: 8 10*3/uL (ref 3.4–10.8)

## 2014-09-03 LAB — HIV ANTIBODY (ROUTINE TESTING W REFLEX): HIV SCREEN 4TH GENERATION: NONREACTIVE

## 2014-09-03 LAB — AB SCR+ANTIBODY ID: Antibody Screen: POSITIVE — AB

## 2014-09-03 LAB — ABO/RH: Rh Factor: NEGATIVE

## 2014-09-03 LAB — VARICELLA ZOSTER ANTIBODY, IGG: Varicella zoster IgG: 2086 index (ref >165)

## 2014-09-03 LAB — HEPATITIS B SURFACE ANTIGEN: HEP B S AG: NEGATIVE

## 2014-09-03 LAB — RUBELLA SCREEN: Rubella Antibodies, IGG: 1.63 index (ref >0.99)

## 2014-09-03 LAB — URINE CULTURE

## 2014-09-03 LAB — RPR: RPR: NONREACTIVE

## 2014-09-03 LAB — SICKLE CELL SCREEN: SICKLE CELL SCREEN: NEGATIVE

## 2014-09-03 LAB — ANTIBODY SCREEN

## 2014-09-04 LAB — GC/CHLAMYDIA PROBE AMP
CHLAMYDIA, DNA PROBE: NEGATIVE
Neisseria gonorrhoeae by PCR: NEGATIVE

## 2014-09-15 ENCOUNTER — Ambulatory Visit (INDEPENDENT_AMBULATORY_CARE_PROVIDER_SITE_OTHER): Payer: Medicaid Other

## 2014-09-15 ENCOUNTER — Encounter: Payer: Self-pay | Admitting: Obstetrics and Gynecology

## 2014-09-15 ENCOUNTER — Ambulatory Visit (INDEPENDENT_AMBULATORY_CARE_PROVIDER_SITE_OTHER): Payer: Medicaid Other | Admitting: Obstetrics and Gynecology

## 2014-09-15 VITALS — BP 110/70 | HR 84 | Wt 117.5 lb

## 2014-09-15 DIAGNOSIS — Z369 Encounter for antenatal screening, unspecified: Secondary | ICD-10-CM

## 2014-09-15 DIAGNOSIS — Z3401 Encounter for supervision of normal first pregnancy, first trimester: Secondary | ICD-10-CM

## 2014-09-15 DIAGNOSIS — Z36 Encounter for antenatal screening of mother: Secondary | ICD-10-CM | POA: Diagnosis not present

## 2014-09-15 DIAGNOSIS — Z1389 Encounter for screening for other disorder: Secondary | ICD-10-CM

## 2014-09-15 DIAGNOSIS — Z3682 Encounter for antenatal screening for nuchal translucency: Secondary | ICD-10-CM

## 2014-09-15 DIAGNOSIS — Z331 Pregnant state, incidental: Secondary | ICD-10-CM

## 2014-09-15 DIAGNOSIS — O209 Hemorrhage in early pregnancy, unspecified: Secondary | ICD-10-CM

## 2014-09-15 LAB — POCT URINALYSIS DIPSTICK
GLUCOSE UA: NEGATIVE
KETONES UA: NEGATIVE
Leukocytes, UA: NEGATIVE
Nitrite, UA: NEGATIVE
Protein, UA: NEGATIVE
RBC UA: NEGATIVE

## 2014-09-15 NOTE — Progress Notes (Signed)
[place] 11+4wks,single IUP, pos fht 163bpm,NT 1.55mm,nb present,normal rt ov,unable to see lt ov,crl 55.65mm,post pl

## 2014-09-15 NOTE — Progress Notes (Signed)
Pt states that she is having some issues with constipation.

## 2014-09-16 LAB — PMP SCREEN PROFILE (10S), URINE
AMPHETAMINE SCRN UR: NEGATIVE ng/mL
Barbiturate Screen, Ur: NEGATIVE ng/mL
Benzodiazepine Screen, Urine: NEGATIVE ng/mL
CANNABINOIDS UR QL SCN: NEGATIVE ng/mL
COCAINE(METAB.) SCREEN, URINE: NEGATIVE ng/mL
Creatinine(Crt), U: 133 mg/dL (ref 20.0–300.0)
Methadone Scn, Ur: NEGATIVE ng/mL
OXYCODONE+OXYMORPHONE UR QL SCN: NEGATIVE ng/mL
Opiate Scrn, Ur: NEGATIVE ng/mL
PCP SCRN UR: NEGATIVE ng/mL
PH UR, DRUG SCRN: 6.8 (ref 4.5–8.9)
Propoxyphene, Screen: NEGATIVE ng/mL

## 2014-09-16 LAB — URINALYSIS, ROUTINE W REFLEX MICROSCOPIC
BILIRUBIN UA: NEGATIVE
Glucose, UA: NEGATIVE
Ketones, UA: NEGATIVE
Leukocytes, UA: NEGATIVE
Nitrite, UA: NEGATIVE
PH UA: 7 (ref 5.0–7.5)
RBC UA: NEGATIVE
Specific Gravity, UA: 1.027 (ref 1.005–1.030)
UUROB: 1 mg/dL (ref 0.2–1.0)

## 2014-09-17 LAB — MATERNAL SCREEN, INTEGRATED #1
CROWN RUMP LENGTH MAT SCREEN: 55.3 mm
GEST. AGE ON COLLECTION DATE: 12.1 wk
Maternal Age at EDD: 25.8 years
Nuchal Translucency (NT): 1.3 mm
Number of Fetuses: 1
PAPP-A Value: 2051.6 ng/mL
WEIGHT: 118 [lb_av]

## 2014-09-17 LAB — URINE CULTURE: Organism ID, Bacteria: NO GROWTH

## 2014-09-29 ENCOUNTER — Telehealth: Payer: Self-pay | Admitting: *Deleted

## 2014-09-29 NOTE — Telephone Encounter (Signed)
Tried to call pt but no answer, just beeps @ 4:39 pm. JSY

## 2014-09-30 NOTE — Telephone Encounter (Signed)
Spoke with pt letting her know she can take the flu shot and use plain Sudafed after 14 weeks. Advised Benadryl was safe during pregnancy, it just may make her sleepy. Pt voiced understanding. JSY

## 2014-10-13 ENCOUNTER — Encounter: Payer: Self-pay | Admitting: Women's Health

## 2014-10-13 ENCOUNTER — Ambulatory Visit (INDEPENDENT_AMBULATORY_CARE_PROVIDER_SITE_OTHER): Payer: Medicaid Other | Admitting: Women's Health

## 2014-10-13 VITALS — BP 102/58 | HR 92 | Wt 121.0 lb

## 2014-10-13 DIAGNOSIS — Z3682 Encounter for antenatal screening for nuchal translucency: Secondary | ICD-10-CM

## 2014-10-13 DIAGNOSIS — Z3402 Encounter for supervision of normal first pregnancy, second trimester: Secondary | ICD-10-CM

## 2014-10-13 DIAGNOSIS — Z363 Encounter for antenatal screening for malformations: Secondary | ICD-10-CM

## 2014-10-13 DIAGNOSIS — Z331 Pregnant state, incidental: Secondary | ICD-10-CM

## 2014-10-13 DIAGNOSIS — Z1389 Encounter for screening for other disorder: Secondary | ICD-10-CM

## 2014-10-13 DIAGNOSIS — Z23 Encounter for immunization: Secondary | ICD-10-CM | POA: Diagnosis not present

## 2014-10-13 LAB — POCT URINALYSIS DIPSTICK
Glucose, UA: NEGATIVE
KETONES UA: NEGATIVE
Leukocytes, UA: NEGATIVE
Nitrite, UA: NEGATIVE
PROTEIN UA: NEGATIVE
RBC UA: NEGATIVE

## 2014-10-13 NOTE — Progress Notes (Signed)
Low-risk OB appointment G1P0 [redacted]w[redacted]d Estimated Date of Delivery: 04/02/15 BP 102/58 mmHg  Pulse 92  Wt 121 lb (54.885 kg)  LMP 07/02/2014  BP, weight, and urine reviewed.  Refer to obstetrical flow sheet for FH & FHR.  No fm yet. Denies cramping, lof, vb, or uti s/s. No complaints. Reviewed warning s/s to report. Plan:  Continue routine obstetrical care  F/U in 4wks for OB appointment and anatomy u/s 2nd IT & flu shot today

## 2014-10-13 NOTE — Patient Instructions (Signed)
Second Trimester of Pregnancy The second trimester is from week 13 through week 28, months 4 through 6. The second trimester is often a time when you feel your best. Your body has also adjusted to being pregnant, and you begin to feel better physically. Usually, morning sickness has lessened or quit completely, you may have more energy, and you may have an increase in appetite. The second trimester is also a time when the fetus is growing rapidly. At the end of the sixth month, the fetus is about 9 inches long and weighs about 1 pounds. You will likely begin to feel the baby move (quickening) between 18 and 20 weeks of the pregnancy. BODY CHANGES Your body goes through many changes during pregnancy. The changes vary from woman to woman.   Your weight will continue to increase. You will notice your lower abdomen bulging out.  You may begin to get stretch marks on your hips, abdomen, and breasts.  You may develop headaches that can be relieved by medicines approved by your health care provider.  You may urinate more often because the fetus is pressing on your bladder.  You may develop or continue to have heartburn as a result of your pregnancy.  You may develop constipation because certain hormones are causing the muscles that push waste through your intestines to slow down.  You may develop hemorrhoids or swollen, bulging veins (varicose veins).  You may have back pain because of the weight gain and pregnancy hormones relaxing your joints between the bones in your pelvis and as a result of a shift in weight and the muscles that support your balance.  Your breasts will continue to grow and be tender.  Your gums may bleed and may be sensitive to brushing and flossing.  Dark spots or blotches (chloasma, mask of pregnancy) may develop on your face. This will likely fade after the baby is born.  A dark line from your belly button to the pubic area (linea nigra) may appear. This will likely fade  after the baby is born.  You may have changes in your hair. These can include thickening of your hair, rapid growth, and changes in texture. Some women also have hair loss during or after pregnancy, or hair that feels dry or thin. Your hair will most likely return to normal after your baby is born. WHAT TO EXPECT AT YOUR PRENATAL VISITS During a routine prenatal visit:  You will be weighed to make sure you and the fetus are growing normally.  Your blood pressure will be taken.  Your abdomen will be measured to track your baby's growth.  The fetal heartbeat will be listened to.  Any test results from the previous visit will be discussed. Your health care provider may ask you:  How you are feeling.  If you are feeling the baby move.  If you have had any abnormal symptoms, such as leaking fluid, bleeding, severe headaches, or abdominal cramping.  If you have any questions. Other tests that may be performed during your second trimester include:  Blood tests that check for:  Low iron levels (anemia).  Gestational diabetes (between 24 and 28 weeks).  Rh antibodies.  Urine tests to check for infections, diabetes, or protein in the urine.  An ultrasound to confirm the proper growth and development of the baby.  An amniocentesis to check for possible genetic problems.  Fetal screens for spina bifida and Down syndrome. HOME CARE INSTRUCTIONS   Avoid all smoking, herbs, alcohol, and unprescribed   drugs. These chemicals affect the formation and growth of the baby.  Follow your health care provider's instructions regarding medicine use. There are medicines that are either safe or unsafe to take during pregnancy.  Exercise only as directed by your health care provider. Experiencing uterine cramps is a good sign to stop exercising.  Continue to eat regular, healthy meals.  Wear a good support bra for breast tenderness.  Do not use hot tubs, steam rooms, or saunas.  Wear your  seat belt at all times when driving.  Avoid raw meat, uncooked cheese, cat litter boxes, and soil used by cats. These carry germs that can cause birth defects in the baby.  Take your prenatal vitamins.  Try taking a stool softener (if your health care provider approves) if you develop constipation. Eat more high-fiber foods, such as fresh vegetables or fruit and whole grains. Drink plenty of fluids to keep your urine clear or pale yellow.  Take warm sitz baths to soothe any pain or discomfort caused by hemorrhoids. Use hemorrhoid cream if your health care provider approves.  If you develop varicose veins, wear support hose. Elevate your feet for 15 minutes, 3-4 times a day. Limit salt in your diet.  Avoid heavy lifting, wear low heel shoes, and practice good posture.  Rest with your legs elevated if you have leg cramps or low back pain.  Visit your dentist if you have not gone yet during your pregnancy. Use a soft toothbrush to brush your teeth and be gentle when you floss.  A sexual relationship may be continued unless your health care provider directs you otherwise.  Continue to go to all your prenatal visits as directed by your health care provider. SEEK MEDICAL CARE IF:   You have dizziness.  You have mild pelvic cramps, pelvic pressure, or nagging pain in the abdominal area.  You have persistent nausea, vomiting, or diarrhea.  You have a bad smelling vaginal discharge.  You have pain with urination. SEEK IMMEDIATE MEDICAL CARE IF:   You have a fever.  You are leaking fluid from your vagina.  You have spotting or bleeding from your vagina.  You have severe abdominal cramping or pain.  You have rapid weight gain or loss.  You have shortness of breath with chest pain.  You notice sudden or extreme swelling of your face, hands, ankles, feet, or legs.  You have not felt your baby move in over an hour.  You have severe headaches that do not go away with  medicine.  You have vision changes. Document Released: 12/27/2000 Document Revised: 01/07/2013 Document Reviewed: 03/05/2012 Exit[employer] Patient Information 2015 [employer]. This information is not intended to replace advice given to you by your health care provider. Make sure you discuss any questions you have with your health care provider.  

## 2014-10-15 LAB — MATERNAL SCREEN, INTEGRATED #2
AFP MARKER: 44.3 ng/mL
AFP MOM: 1.15
Crown Rump Length: 55.3 mm
DIA MoM: 1.96
DIA VALUE: 393.1 pg/mL
Estriol, Unconjugated: 0.88 ng/mL
GESTATIONAL AGE: 16.1 wk
Gest. Age on Collection Date: 12.1 weeks
HCG MOM: 2.9
HCG VALUE: 112.3 [IU]/mL
MATERNAL AGE AT EDD: 25.8 a
Nuchal Translucency (NT): 1.3 mm
Nuchal Translucency MoM: 0.95
Number of Fetuses: 1
PAPP-A MOM: 1.84
PAPP-A VALUE: 2051.6 ng/mL
Test Results:: NEGATIVE
WEIGHT: 118 [lb_av]
WEIGHT: 121 [lb_av]
uE3 MoM: 0.96

## 2014-10-29 ENCOUNTER — Telehealth: Payer: Self-pay | Admitting: Advanced Practice Midwife

## 2014-10-29 NOTE — Telephone Encounter (Signed)
Pt c/o allergies, what can she take? Pt informed can take OTC Zyrtec or Claritin. If no improvement pt to call our office back. Pt verbalized understanding.

## 2014-11-03 ENCOUNTER — Telehealth: Payer: Self-pay | Admitting: Women's Health

## 2014-11-03 NOTE — Telephone Encounter (Signed)
Pt informed per [name] Booker, CNM ok to have dental x-rays at 18 weeks of pregnancy. Pt verbalized understanding.

## 2014-11-10 ENCOUNTER — Encounter: Payer: Self-pay | Admitting: Women's Health

## 2014-11-10 ENCOUNTER — Ambulatory Visit (INDEPENDENT_AMBULATORY_CARE_PROVIDER_SITE_OTHER): Payer: Medicaid Other | Admitting: Women's Health

## 2014-11-10 ENCOUNTER — Ambulatory Visit (INDEPENDENT_AMBULATORY_CARE_PROVIDER_SITE_OTHER): Payer: Medicaid Other

## 2014-11-10 VITALS — BP 118/58 | HR 88 | Wt 129.0 lb

## 2014-11-10 DIAGNOSIS — Z3402 Encounter for supervision of normal first pregnancy, second trimester: Secondary | ICD-10-CM

## 2014-11-10 DIAGNOSIS — Z36 Encounter for antenatal screening of mother: Secondary | ICD-10-CM

## 2014-11-10 DIAGNOSIS — Z1389 Encounter for screening for other disorder: Secondary | ICD-10-CM

## 2014-11-10 DIAGNOSIS — Z331 Pregnant state, incidental: Secondary | ICD-10-CM

## 2014-11-10 DIAGNOSIS — Z363 Encounter for antenatal screening for malformations: Secondary | ICD-10-CM

## 2014-11-10 LAB — POCT URINALYSIS DIPSTICK
Blood, UA: NEGATIVE
GLUCOSE UA: NEGATIVE
KETONES UA: NEGATIVE
Nitrite, UA: NEGATIVE
Protein, UA: NEGATIVE

## 2014-11-10 NOTE — Patient Instructions (Signed)

## 2014-11-10 NOTE — Progress Notes (Signed)
US 19+4 wks, measurements c/w dates,cx 3.3cm,svp of fluid 4.9cm,post pl gr 0,cephalic,bilat adnexa wnl,efw [id number] g,anatomy complete no obvious abn seen

## 2014-11-10 NOTE — Progress Notes (Signed)
Low-risk OB appointment G1P0 6966w4d Estimated Date of Delivery: 04/02/15 BP 118/58 mmHg  Pulse 88  Wt 129 lb (58.514 kg)  LMP 07/02/2014  BP, weight, and urine reviewed.  Refer to obstetrical flow sheet for FH & FHR.  Reports good fm.  Denies regular uc's, lof, vb, or uti s/s. No complaints. Reviewed today's normal anatomy u/s, ptl s/s, fm. Plan:  Continue routine obstetrical care  F/U in 3wks for OB appointment ([place]

## 2014-11-25 ENCOUNTER — Encounter: Payer: Self-pay | Admitting: Family Medicine

## 2014-11-25 ENCOUNTER — Ambulatory Visit (INDEPENDENT_AMBULATORY_CARE_PROVIDER_SITE_OTHER): Payer: Medicaid Other | Admitting: Family Medicine

## 2014-11-25 VITALS — BP 100/62 | HR 78 | Temp 97.8°F | Resp 16 | Ht 67.0 in | Wt 135.0 lb

## 2014-11-25 DIAGNOSIS — J309 Allergic rhinitis, unspecified: Secondary | ICD-10-CM | POA: Diagnosis not present

## 2014-11-25 DIAGNOSIS — Z0000 Encounter for general adult medical examination without abnormal findings: Secondary | ICD-10-CM

## 2014-11-25 NOTE — Patient Instructions (Signed)
Continue vitamins  Use nasal saline and benadryl as needed  Immunizations up to date F/U 1 year as needed

## 2014-11-25 NOTE — Progress Notes (Signed)
Patient ID: Kelly Villarreal, female   DOB: Mar 12, 1990, 24 y.o.   MRN: [id number]   Subjective:    Patient ID: Kelly Villarreal, female    DOB: Mar 12, 1990, 24 y.o.   MRN: [id number]  Patient presents for CPE  Kelly Villarreal here for complete physical exam.. She states that she's had a lot of testing done with OB/GYN but she never received any results. She went more reassurance that her health was in order. She is currently [redacted] weeks pregnant. Her last Pap smear was in May 2015. Her flu shot is up-to-date. She has had a tetanus booster and I presume we'll get another Tdap towards the end of her pregnancy. I reviewed all her labs including her STD screening. She does not need any new labs today. She has no particular concerns. She does suffer with allergies which she typically gets around this time year but is limited on what she can use during her pregnancy. She also gets some mid thoracic back pain since she's been pregnant. Uses Tylenol as needed  Following with dentist- has right sided tooth pain, planning evaluation in 2 weeks  Eye exam UTD- wears glasses   Review Of Systems:  GEN- denies fatigue, fever, weight loss,weakness, recent illness HEENT- denies eye drainage, change in vision,+nasal discharge, CVS- denies chest pain, palpitations RESP- denies SOB, cough, wheeze ABD- denies N/V, change in stools, abd pain GU- denies dysuria, hematuria, dribbling, incontinence MSK- +joint pain, muscle aches, injury Neuro- denies headache, dizziness, syncope, seizure activity       Objective:    BP 100/62 mmHg  Pulse 78  Temp(Src) 97.8 F (36.6 C) (Oral)  Resp 16  Ht 5\' 7"  (1.702 m)  Wt 135 lb (61.236 kg)  BMI 21.14 kg/m2  LMP 07/02/2014 GEN- NAD, alert and oriented x3 HEENT- PERRL, EOMI, non injected sclera, pink conjunctiva, MMM, oropharynx clear Neck- Supple, no thyromegaly CVS- RRR, no murmur RESP-CTAB ABD-NABS,Gravid EXT- trace left ankle edema,neg homans bilat Pulses- Radial, DP- 2+         Assessment & Plan:      Problem List Items Addressed This Visit    None    Visit Diagnoses    Routine general medical examination at a health care facility    -  Primary    PAP UTD, immunizations UTD, OB will give TDAP as indicated. Approx 20 minutes spent reviewing her labs and answering questions from pregnancy    Allergic rhinitis, unspecified allergic rhinitis type        2/2 season change and pregnancy, advised nasal saline, humidfier, Benadryl if severe       Note: This dictation was prepared with Dragon dictation along with smaller phrase technology. Any transcriptional errors that result from this process are unintentional.

## 2014-12-01 ENCOUNTER — Encounter: Payer: Self-pay | Admitting: Women's Health

## 2014-12-01 ENCOUNTER — Ambulatory Visit (INDEPENDENT_AMBULATORY_CARE_PROVIDER_SITE_OTHER): Payer: Medicaid Other | Admitting: Women's Health

## 2014-12-01 VITALS — BP 102/56 | HR 84 | Wt 136.0 lb

## 2014-12-01 DIAGNOSIS — Z1389 Encounter for screening for other disorder: Secondary | ICD-10-CM

## 2014-12-01 DIAGNOSIS — Z3A22 22 weeks gestation of pregnancy: Secondary | ICD-10-CM

## 2014-12-01 DIAGNOSIS — Z331 Pregnant state, incidental: Secondary | ICD-10-CM

## 2014-12-01 DIAGNOSIS — Z3402 Encounter for supervision of normal first pregnancy, second trimester: Secondary | ICD-10-CM

## 2014-12-01 LAB — POCT URINALYSIS DIPSTICK
Glucose, UA: NEGATIVE
Ketones, UA: NEGATIVE
Leukocytes, UA: NEGATIVE
NITRITE UA: NEGATIVE
PROTEIN UA: NEGATIVE
RBC UA: NEGATIVE

## 2014-12-01 NOTE — Progress Notes (Signed)
Low-risk OB appointment G1P0 3291w4d Estimated Date of Delivery: 04/02/15 BP 102/56 mmHg  Pulse 84  Wt 136 lb (61.689 kg)  LMP 07/02/2014  BP, weight, and urine reviewed.  Refer to obstetrical flow sheet for FH & FHR.  Reports good fm.  Denies regular uc's, lof, vb, or uti s/s. No complaints. Reviewed ptl s/s, fm. Plan:  Continue routine obstetrical care  F/U in 4wks for OB appointment and PN2

## 2014-12-01 NOTE — Patient Instructions (Signed)
You will have your sugar test next visit.  Please do not eat or drink anything after midnight the night before you come, not even water.  You will be here for at least two hours.     Call the office ([phone]) or go to [facility] if:  You begin to have strong, frequent contractions  Your water breaks.  Sometimes it is a big gush of fluid, sometimes it is just a trickle that keeps getting your panties wet or running down your legs  You have vaginal bleeding.  It is normal to have a small amount of spotting if your cervix was checked.   You don't feel your baby moving like normal.  If you don't, get you something to eat and drink and lay down and focus on feeling your baby move.   If your baby is still not moving like normal, you should call the office or go to [facility].  Second Trimester of Pregnancy The second trimester is from week 13 through week 28, months 4 through 6. The second trimester is often a time when you feel your best. Your body has also adjusted to being pregnant, and you begin to feel better physically. Usually, morning sickness has lessened or quit completely, you may have more energy, and you may have an increase in appetite. The second trimester is also a time when the fetus is growing rapidly. At the end of the sixth month, the fetus is about 9 inches long and weighs about 1 pounds. You will likely begin to feel the baby move (quickening) between 18 and 20 weeks of the pregnancy. BODY CHANGES Your body goes through many changes during pregnancy. The changes vary from woman to woman.   Your weight will continue to increase. You will notice your lower abdomen bulging out.  You may begin to get stretch marks on your hips, abdomen, and breasts.  You may develop headaches that can be relieved by medicines approved by your health care provider.  You may urinate more often because the fetus is pressing on your bladder.  You may develop or continue to have  heartburn as a result of your pregnancy.  You may develop constipation because certain hormones are causing the muscles that push waste through your intestines to slow down.  You may develop hemorrhoids or swollen, bulging veins (varicose veins).  You may have back pain because of the weight gain and pregnancy hormones relaxing your joints between the bones in your pelvis and as a result of a shift in weight and the muscles that support your balance.  Your breasts will continue to grow and be tender.  Your gums may bleed and may be sensitive to brushing and flossing.  Dark spots or blotches (chloasma, mask of pregnancy) may develop on your face. This will likely fade after the baby is born.  A dark line from your belly button to the pubic area (linea nigra) may appear. This will likely fade after the baby is born.  You may have changes in your hair. These can include thickening of your hair, rapid growth, and changes in texture. Some women also have hair loss during or after pregnancy, or hair that feels dry or thin. Your hair will most likely return to normal after your baby is born. WHAT TO EXPECT AT YOUR PRENATAL VISITS During a routine prenatal visit:  You will be weighed to make sure you and the fetus are growing normally.  Your blood pressure will be taken.    Your abdomen will be measured to track your baby's growth.  The fetal heartbeat will be listened to.  Any test results from the previous visit will be discussed. Your health care provider may ask you:  How you are feeling.  If you are feeling the baby move.  If you have had any abnormal symptoms, such as leaking fluid, bleeding, severe headaches, or abdominal cramping.  If you have any questions. Other tests that may be performed during your second trimester include:  Blood tests that check for:  Low iron levels (anemia).  Gestational diabetes (between 24 and 28 weeks).  Rh antibodies.  Urine tests to check  for infections, diabetes, or protein in the urine.  An ultrasound to confirm the proper growth and development of the baby.  An amniocentesis to check for possible genetic problems.  Fetal screens for spina bifida and Down syndrome. HOME CARE INSTRUCTIONS   Avoid all smoking, herbs, alcohol, and unprescribed drugs. These chemicals affect the formation and growth of the baby.  Follow your health care provider's instructions regarding medicine use. There are medicines that are either safe or unsafe to take during pregnancy.  Exercise only as directed by your health care provider. Experiencing uterine cramps is a good sign to stop exercising.  Continue to eat regular, healthy meals.  Wear a good support bra for breast tenderness.  Do not use hot tubs, steam rooms, or saunas.  Wear your seat belt at all times when driving.  Avoid raw meat, uncooked cheese, cat litter boxes, and soil used by cats. These carry germs that can cause birth defects in the baby.  Take your prenatal vitamins.  Try taking a stool softener (if your health care provider approves) if you develop constipation. Eat more high-fiber foods, such as fresh vegetables or fruit and whole grains. Drink plenty of fluids to keep your urine clear or pale yellow.  Take warm sitz baths to soothe any pain or discomfort caused by hemorrhoids. Use hemorrhoid cream if your health care provider approves.  If you develop varicose veins, wear support hose. Elevate your feet for 15 minutes, 3-4 times a day. Limit salt in your diet.  Avoid heavy lifting, wear low heel shoes, and practice good posture.  Rest with your legs elevated if you have leg cramps or low back pain.  Visit your dentist if you have not gone yet during your pregnancy. Use a soft toothbrush to brush your teeth and be gentle when you floss.  A sexual relationship may be continued unless your health care provider directs you otherwise.  Continue to go to all your  prenatal visits as directed by your health care provider. SEEK MEDICAL CARE IF:   You have dizziness.  You have mild pelvic cramps, pelvic pressure, or nagging pain in the abdominal area.  You have persistent nausea, vomiting, or diarrhea.  You have a bad smelling vaginal discharge.  You have pain with urination. SEEK IMMEDIATE MEDICAL CARE IF:   You have a fever.  You are leaking fluid from your vagina.  You have spotting or bleeding from your vagina.  You have severe abdominal cramping or pain.  You have rapid weight gain or loss.  You have shortness of breath with chest pain.  You notice sudden or extreme swelling of your face, hands, ankles, feet, or legs.  You have not felt your baby move in over an hour.  You have severe headaches that do not go away with medicine.  You have vision changes.   Document Released: 12/27/2000 Document Revised: 01/07/2013 Document Reviewed: 03/05/2012 [employer] Patient Information 2015 [employer]. This information is not intended to replace advice given to you by your health care provider. Make sure you discuss any questions you have with your health care provider.     

## 2014-12-16 ENCOUNTER — Encounter (HOSPITAL_COMMUNITY): Payer: Self-pay | Admitting: *Deleted

## 2014-12-16 ENCOUNTER — Encounter (HOSPITAL_COMMUNITY)
Admission: EM | Disposition: A | Discharge status: Home / Self Care | Payer: Self-pay | Source: Home / Self Care | Attending: Obstetrics & Gynecology

## 2014-12-16 ENCOUNTER — Inpatient Hospital Stay (HOSPITAL_COMMUNITY): Payer: Medicaid Other | Admitting: Anesthesiology

## 2014-12-16 ENCOUNTER — Inpatient Hospital Stay (HOSPITAL_COMMUNITY)
Admission: EM | Admit: 2014-12-16 | Discharge: 2014-12-18 | DRG: 765 | Disposition: A | Discharge status: Home / Self Care | Payer: Medicaid Other | Attending: Obstetrics & Gynecology | Admitting: Obstetrics & Gynecology

## 2014-12-16 DIAGNOSIS — Z83511 Family history of glaucoma: Secondary | ICD-10-CM

## 2014-12-16 DIAGNOSIS — O4102X1 Oligohydramnios, second trimester, fetus 1: Secondary | ICD-10-CM | POA: Diagnosis not present

## 2014-12-16 DIAGNOSIS — O4440 Low lying placenta NOS or without hemorrhage, unspecified trimester: Secondary | ICD-10-CM

## 2014-12-16 DIAGNOSIS — O99344 Other mental disorders complicating childbirth: Secondary | ICD-10-CM | POA: Diagnosis present

## 2014-12-16 DIAGNOSIS — O322XX0 Maternal care for transverse and oblique lie, not applicable or unspecified: Secondary | ICD-10-CM

## 2014-12-16 DIAGNOSIS — O322XX1 Maternal care for transverse and oblique lie, fetus 1: Secondary | ICD-10-CM | POA: Diagnosis not present

## 2014-12-16 DIAGNOSIS — O4592 Premature separation of placenta, unspecified, second trimester: Secondary | ICD-10-CM

## 2014-12-16 DIAGNOSIS — O42912 Preterm premature rupture of membranes, unspecified as to length of time between rupture and onset of labor, second trimester: Secondary | ICD-10-CM | POA: Diagnosis present

## 2014-12-16 DIAGNOSIS — O4102X0 Oligohydramnios, second trimester, not applicable or unspecified: Secondary | ICD-10-CM | POA: Diagnosis present

## 2014-12-16 DIAGNOSIS — Z3A24 24 weeks gestation of pregnancy: Secondary | ICD-10-CM

## 2014-12-16 DIAGNOSIS — Z8249 Family history of ischemic heart disease and other diseases of the circulatory system: Secondary | ICD-10-CM

## 2014-12-16 DIAGNOSIS — F329 Major depressive disorder, single episode, unspecified: Secondary | ICD-10-CM | POA: Diagnosis present

## 2014-12-16 DIAGNOSIS — F419 Anxiety disorder, unspecified: Secondary | ICD-10-CM | POA: Diagnosis present

## 2014-12-16 DIAGNOSIS — Z833 Family history of diabetes mellitus: Secondary | ICD-10-CM | POA: Diagnosis not present

## 2014-12-16 DIAGNOSIS — O4590 Premature separation of placenta, unspecified, unspecified trimester: Secondary | ICD-10-CM

## 2014-12-16 DIAGNOSIS — O4290 Premature rupture of membranes, unspecified as to length of time between rupture and onset of labor, unspecified weeks of gestation: Secondary | ICD-10-CM

## 2014-12-16 DIAGNOSIS — O42012 Preterm premature rupture of membranes, onset of labor within 24 hours of rupture, second trimester: Principal | ICD-10-CM | POA: Diagnosis present

## 2014-12-16 DIAGNOSIS — Z8759 Personal history of other complications of pregnancy, childbirth and the puerperium: Secondary | ICD-10-CM

## 2014-12-16 LAB — CBC
HEMATOCRIT: 26.1 % — AB (ref 36.0–46.0)
HEMATOCRIT: 27.2 % — AB (ref 36.0–46.0)
HEMOGLOBIN: 8.8 g/dL — AB (ref 12.0–15.0)
Hemoglobin: 9.2 g/dL — ABNORMAL LOW (ref 12.0–15.0)
MCH: 31.5 pg (ref 26.0–34.0)
MCH: 31.7 pg (ref 26.0–34.0)
MCHC: 33.7 g/dL (ref 30.0–36.0)
MCHC: 33.8 g/dL (ref 30.0–36.0)
MCV: 93.5 fL (ref 78.0–100.0)
MCV: 93.8 fL (ref 78.0–100.0)
PLATELETS: 193 10*3/uL (ref 150–400)
Platelets: 185 10*3/uL (ref 150–400)
RBC: 2.79 MIL/uL — ABNORMAL LOW (ref 3.87–5.11)
RBC: 2.9 MIL/uL — AB (ref 3.87–5.11)
RDW: 12.9 % (ref 11.5–15.5)
RDW: 12.7 % (ref 11.5–15.5)
WBC: 11.3 10*3/uL — ABNORMAL HIGH (ref 4.0–10.5)
WBC: 17.1 10*3/uL — ABNORMAL HIGH (ref 4.0–10.5)

## 2014-12-16 LAB — ABO/RH: ABO/RH(D): O NEG

## 2014-12-16 LAB — RAPID URINE DRUG SCREEN, HOSP PERFORMED
Amphetamines: NOT DETECTED
BARBITURATES: NOT DETECTED
BENZODIAZEPINES: POSITIVE — AB
COCAINE: NOT DETECTED
Opiates: NOT DETECTED
TETRAHYDROCANNABINOL: NOT DETECTED

## 2014-12-16 LAB — PREPARE RBC (CROSSMATCH)

## 2014-12-16 SURGERY — Surgical Case
Anesthesia: Regional

## 2014-12-16 MED ORDER — DEXAMETHASONE SODIUM PHOSPHATE 10 MG/ML IJ SOLN
INTRAMUSCULAR | Status: DC | PRN
  Administered 2014-12-16: 10 mg via INTRAVENOUS

## 2014-12-16 MED ORDER — SODIUM CHLORIDE 0.9 % IV BOLUS (SEPSIS)
1000.0000 mL | Freq: Once | INTRAVENOUS | Status: AC
  Administered 2014-12-16: 1000 mL via INTRAVENOUS

## 2014-12-16 MED ORDER — CITRIC ACID-SODIUM CITRATE 334-500 MG/5ML PO SOLN
30.0000 mL | ORAL | Status: DC | PRN
  Administered 2014-12-16: 30 mL via ORAL

## 2014-12-16 MED ORDER — BUPIVACAINE HCL (PF) 0.5 % IJ SOLN
INTRAMUSCULAR | Status: AC
  Filled 2014-12-16: qty 30

## 2014-12-16 MED ORDER — LACTATED RINGERS IV SOLN
40.0000 [IU] | INTRAVENOUS | Status: DC | PRN
  Administered 2014-12-16: 40 [IU] via INTRAVENOUS

## 2014-12-16 MED ORDER — SUCCINYLCHOLINE CHLORIDE 20 MG/ML IJ SOLN
INTRAMUSCULAR | Status: AC
  Filled 2014-12-16: qty 1

## 2014-12-16 MED ORDER — MAGNESIUM SULFATE BOLUS VIA INFUSION
4.0000 g | Freq: Once | INTRAVENOUS | Status: AC
  Administered 2014-12-16: 4 g via INTRAVENOUS

## 2014-12-16 MED ORDER — LACTATED RINGERS IV SOLN
INTRAVENOUS | Status: DC | PRN
  Administered 2014-12-16: 10:00:00 via INTRAVENOUS

## 2014-12-16 MED ORDER — MAGNESIUM SULFATE 40 G IN LACTATED RINGERS - SIMPLE
2.0000 g/h | INTRAVENOUS | Status: DC
  Administered 2014-12-16: 2 g/h via INTRAVENOUS

## 2014-12-16 MED ORDER — ACETAMINOPHEN 325 MG PO TABS: 650.0000 mg | ORAL_TABLET | ORAL | Status: DC | PRN

## 2014-12-16 MED ORDER — CEFAZOLIN SODIUM-DEXTROSE 2-3 GM-% IV SOLR
INTRAVENOUS | Status: DC | PRN
  Administered 2014-12-16: 2 g via INTRAVENOUS

## 2014-12-16 MED ORDER — OXYTOCIN 40 UNITS IN LACTATED RINGERS INFUSION - SIMPLE MED: 62.5000 mL/h | INTRAVENOUS | Status: AC

## 2014-12-16 MED ORDER — HYDROMORPHONE HCL 1 MG/ML IJ SOLN
INTRAMUSCULAR | Status: AC
  Filled 2014-12-16: qty 1

## 2014-12-16 MED ORDER — DIPHENHYDRAMINE HCL 12.5 MG/5ML PO ELIX: 12.5000 mg | ORAL_SOLUTION | Freq: Four times a day (QID) | ORAL | Status: DC | PRN

## 2014-12-16 MED ORDER — OXYCODONE-ACETAMINOPHEN 5-325 MG PO TABS
1.0000 | ORAL_TABLET | ORAL | Status: DC | PRN
  Administered 2014-12-17 (×4): 1 via ORAL
  Filled 2014-12-16 (×5): qty 1

## 2014-12-16 MED ORDER — CEFAZOLIN SODIUM-DEXTROSE 2-3 GM-% IV SOLR
INTRAVENOUS | Status: AC
  Filled 2014-12-16: qty 50

## 2014-12-16 MED ORDER — MAGNESIUM SULFATE 2 GM/50ML IV SOLN
INTRAVENOUS | Status: AC
  Filled 2014-12-16: qty 50

## 2014-12-16 MED ORDER — SODIUM CHLORIDE 0.9 % IR SOLN
Status: DC | PRN
Start: 2014-12-16 — End: 2014-12-16
  Administered 2014-12-16: 1000 mL

## 2014-12-16 MED ORDER — OXYTOCIN 10 UNIT/ML IJ SOLN
INTRAMUSCULAR | Status: AC
  Filled 2014-12-16: qty 4

## 2014-12-16 MED ORDER — HYDROMORPHONE HCL 1 MG/ML IJ SOLN
0.2500 mg | INTRAMUSCULAR | Status: DC | PRN
  Administered 2014-12-16: 0.25 mg via INTRAVENOUS
  Administered 2014-12-16: 0.5 mg via INTRAVENOUS
  Administered 2014-12-16: 0.25 mg via INTRAVENOUS

## 2014-12-16 MED ORDER — MIDAZOLAM HCL 2 MG/2ML IJ SOLN
INTRAMUSCULAR | Status: DC | PRN
  Administered 2014-12-16: 2 mg via INTRAVENOUS

## 2014-12-16 MED ORDER — NALOXONE HCL 0.4 MG/ML IJ SOLN: 0.4000 mg | INTRAMUSCULAR | Status: DC | PRN

## 2014-12-16 MED ORDER — DEXAMETHASONE SODIUM PHOSPHATE 10 MG/ML IJ SOLN
INTRAMUSCULAR | Status: AC
  Filled 2014-12-16: qty 1

## 2014-12-16 MED ORDER — MAGNESIUM SULFATE 40 G IN LACTATED RINGERS - SIMPLE: 2.0000 g/h | INTRAVENOUS | Status: DC

## 2014-12-16 MED ORDER — LIDOCAINE HCL (CARDIAC) 20 MG/ML IV SOLN
INTRAVENOUS | Status: AC
  Filled 2014-12-16: qty 5

## 2014-12-16 MED ORDER — SIMETHICONE 80 MG PO CHEW: 80.0000 mg | CHEWABLE_TABLET | ORAL | Status: DC | PRN

## 2014-12-16 MED ORDER — FENTANYL CITRATE (PF) 100 MCG/2ML IJ SOLN
INTRAMUSCULAR | Status: AC
  Filled 2014-12-16: qty 2

## 2014-12-16 MED ORDER — ONDANSETRON HCL 4 MG/2ML IJ SOLN: 4.0000 mg | Freq: Once | INTRAMUSCULAR | Status: DC | PRN

## 2014-12-16 MED ORDER — MAGNESIUM SULFATE BOLUS VIA INFUSION
4.0000 g | Freq: Once | INTRAVENOUS | Status: DC
  Filled 2014-12-16: qty 500

## 2014-12-16 MED ORDER — MAGNESIUM SULFATE 40 G IN LACTATED RINGERS - SIMPLE
INTRAVENOUS | Status: AC
  Administered 2014-12-16: 2 g/h via INTRAVENOUS
  Filled 2014-12-16: qty 500

## 2014-12-16 MED ORDER — OXYTOCIN 40 UNITS IN LACTATED RINGERS INFUSION - SIMPLE MED: 62.5000 mL/h | INTRAVENOUS | Status: DC

## 2014-12-16 MED ORDER — SIMETHICONE 80 MG PO CHEW
80.0000 mg | CHEWABLE_TABLET | Freq: Three times a day (TID) | ORAL | Status: DC
  Administered 2014-12-16 – 2014-12-18 (×5): 80 mg via ORAL
  Filled 2014-12-16 (×5): qty 1

## 2014-12-16 MED ORDER — HYDROMORPHONE HCL 1 MG/ML IJ SOLN
INTRAMUSCULAR | Status: DC | PRN
  Administered 2014-12-16: 1 mg via INTRAVENOUS

## 2014-12-16 MED ORDER — ONDANSETRON HCL 4 MG/2ML IJ SOLN: 4.0000 mg | Freq: Four times a day (QID) | INTRAMUSCULAR | Status: DC | PRN

## 2014-12-16 MED ORDER — MIDAZOLAM HCL 2 MG/2ML IJ SOLN
INTRAMUSCULAR | Status: AC
  Filled 2014-12-16: qty 2

## 2014-12-16 MED ORDER — DIBUCAINE 1 % RE OINT: 1.0000 "application " | TOPICAL_OINTMENT | RECTAL | Status: DC | PRN

## 2014-12-16 MED ORDER — SCOPOLAMINE 1 MG/3DAYS TD PT72
MEDICATED_PATCH | TRANSDERMAL | Status: AC
Start: 2014-12-16 — End: 2014-12-16
  Filled 2014-12-16: qty 1

## 2014-12-16 MED ORDER — MENTHOL 3 MG MT LOZG: 1.0000 | LOZENGE | OROMUCOSAL | Status: DC | PRN

## 2014-12-16 MED ORDER — WITCH HAZEL-GLYCERIN EX PADS: 1.0000 "application " | MEDICATED_PAD | CUTANEOUS | Status: DC | PRN

## 2014-12-16 MED ORDER — PRENATAL MULTIVITAMIN CH
1.0000 | ORAL_TABLET | Freq: Every day | ORAL | Status: DC
  Administered 2014-12-17: 1 via ORAL
  Filled 2014-12-16: qty 1

## 2014-12-16 MED ORDER — FENTANYL CITRATE (PF) 250 MCG/5ML IJ SOLN
INTRAMUSCULAR | Status: AC
  Filled 2014-12-16: qty 5

## 2014-12-16 MED ORDER — LANOLIN HYDROUS EX OINT: 1.0000 "application " | TOPICAL_OINTMENT | CUTANEOUS | Status: DC | PRN

## 2014-12-16 MED ORDER — HYDROMORPHONE 1 MG/ML IV SOLN
INTRAVENOUS | Status: DC
  Administered 2014-12-16: 2.8 mg via INTRAVENOUS
  Administered 2014-12-17: 0.4 mg via INTRAVENOUS
  Administered 2014-12-17: 1.2 mg via INTRAVENOUS

## 2014-12-16 MED ORDER — SCOPOLAMINE 1 MG/3DAYS TD PT72SCOPOLAMINE 1 MG/3DAYS
MEDICATED_PATCH | TRANSDERMAL | Status: DC | PRN
Start: 2014-12-16 — End: 2014-12-16
  Administered 2014-12-16: 1 via TRANSDERMAL

## 2014-12-16 MED ORDER — SENNOSIDES-DOCUSATE SODIUM 8.6-50 MG PO TABS
2.0000 | ORAL_TABLET | ORAL | Status: DC
  Administered 2014-12-16 – 2014-12-17 (×2): 2 via ORAL
  Filled 2014-12-16 (×2): qty 2

## 2014-12-16 MED ORDER — PROPOFOL 10 MG/ML IV BOLUS
INTRAVENOUS | Status: AC
  Filled 2014-12-16: qty 20

## 2014-12-16 MED ORDER — ONDANSETRON HCL 4 MG/2ML IJ SOLN
INTRAMUSCULAR | Status: AC
  Filled 2014-12-16: qty 2

## 2014-12-16 MED ORDER — SODIUM CHLORIDE 0.9 % IV SOLN: Freq: Once | INTRAVENOUS | Status: DC

## 2014-12-16 MED ORDER — LIDOCAINE HCL (PF) 1 % IJ SOLN: 30.0000 mL | INTRAMUSCULAR | Status: DC | PRN

## 2014-12-16 MED ORDER — SUCCINYLCHOLINE CHLORIDE 20 MG/ML IJ SOLN
INTRAMUSCULAR | Status: DC | PRN
  Administered 2014-12-16: 120 mg via INTRAVENOUS

## 2014-12-16 MED ORDER — HYDROMORPHONE 1 MG/ML IV SOLN
INTRAVENOUS | Status: DC
  Administered 2014-12-16: 2 mL via INTRAVENOUS
  Administered 2014-12-16: 14:00:00 via INTRAVENOUS
  Filled 2014-12-16: qty 25

## 2014-12-16 MED ORDER — DIPHENHYDRAMINE HCL 25 MG PO CAPS: 25.0000 mg | ORAL_CAPSULE | Freq: Four times a day (QID) | ORAL | Status: DC | PRN

## 2014-12-16 MED ORDER — LACTATED RINGERS IV SOLN
INTRAVENOUS | Status: DC | PRN
  Administered 2014-12-16 (×2): via INTRAVENOUS

## 2014-12-16 MED ORDER — CITRIC ACID-SODIUM CITRATE 334-500 MG/5ML PO SOLN
ORAL | Status: AC
  Administered 2014-12-16: 30 mL via ORAL
  Filled 2014-12-16: qty 15

## 2014-12-16 MED ORDER — DIPHENHYDRAMINE HCL 50 MG/ML IJ SOLN: 12.5000 mg | Freq: Four times a day (QID) | INTRAMUSCULAR | Status: DC | PRN

## 2014-12-16 MED ORDER — SODIUM CHLORIDE 0.9 % IJ SOLN: 9.0000 mL | INTRAMUSCULAR | Status: DC | PRN

## 2014-12-16 MED ORDER — PROPOFOL 10 MG/ML IV BOLUS
INTRAVENOUS | Status: DC | PRN
  Administered 2014-12-16: 150 mg via INTRAVENOUS

## 2014-12-16 MED ORDER — LIDOCAINE HCL (CARDIAC) 20 MG/ML IV SOLN
INTRAVENOUS | Status: DC | PRN
  Administered 2014-12-16: 100 mg via INTRAVENOUS

## 2014-12-16 MED ORDER — OXYCODONE-ACETAMINOPHEN 5-325 MG PO TABS
2.0000 | ORAL_TABLET | ORAL | Status: DC | PRN
Start: 2014-12-16 — End: 2014-12-18

## 2014-12-16 MED ORDER — ZOLPIDEM TARTRATE 5 MG PO TABS: 5.0000 mg | ORAL_TABLET | Freq: Every evening | ORAL | Status: DC | PRN

## 2014-12-16 MED ORDER — FENTANYL CITRATE (PF) 250 MCG/5ML IJ SOLN
INTRAMUSCULAR | Status: DC | PRN
  Administered 2014-12-16: 150 ug via INTRAVENOUS
  Administered 2014-12-16 (×2): 50 ug via INTRAVENOUS

## 2014-12-16 MED ORDER — AMPICILLIN SODIUM 1 G IJ SOLR
2.0000 g | Freq: Once | INTRAMUSCULAR | Status: AC
  Administered 2014-12-16: 2 g via INTRAVENOUS
  Filled 2014-12-16: qty 2000

## 2014-12-16 MED ORDER — LACTATED RINGERS IV SOLN
INTRAVENOUS | Status: DC
  Administered 2014-12-16: 18:00:00 via INTRAVENOUS

## 2014-12-16 MED ORDER — LACTATED RINGERS IV SOLN: INTRAVENOUS | Status: DC

## 2014-12-16 MED ORDER — ONDANSETRON HCL 4 MG/2ML IJ SOLN
INTRAMUSCULAR | Status: DC | PRN
  Administered 2014-12-16: 4 mg via INTRAVENOUS

## 2014-12-16 MED ORDER — TETANUS-DIPHTH-ACELL PERTUSSIS 5-2.5-18.5 LF-MCG/0.5 IM SUSP
0.5000 mL | Freq: Once | INTRAMUSCULAR | Status: AC
  Administered 2014-12-17: 0.5 mL via INTRAMUSCULAR

## 2014-12-16 MED ORDER — MORPHINE SULFATE (PF) 0.5 MG/ML IJ SOLN
INTRAMUSCULAR | Status: AC
Start: 2014-12-16 — End: 2014-12-16
  Filled 2014-12-16: qty 10

## 2014-12-16 MED ORDER — BETAMETHASONE SOD PHOS & ACET 6 (3-3) MG/ML IJ SUSP
12.0000 mg | Freq: Once | INTRAMUSCULAR | Status: AC
  Administered 2014-12-16: 12 mg via INTRAMUSCULAR
  Filled 2014-12-16 (×2): qty 2

## 2014-12-16 MED ORDER — SIMETHICONE 80 MG PO CHEW
80.0000 mg | CHEWABLE_TABLET | ORAL | Status: DC
  Administered 2014-12-16 – 2014-12-17 (×2): 80 mg via ORAL
  Filled 2014-12-16 (×2): qty 1

## 2014-12-16 MED ORDER — OXYTOCIN BOLUS FROM INFUSION: 500.0000 mL | INTRAVENOUS | Status: DC

## 2014-12-16 MED ORDER — LACTATED RINGERS IV SOLN: 500.0000 mL | INTRAVENOUS | Status: DC | PRN

## 2014-12-16 MED ORDER — PHENYLEPHRINE 8 MG IN D5W 100 ML (0.08MG/ML) PREMIX OPTIME
INJECTION | INTRAVENOUS | Status: AC
  Filled 2014-12-16: qty 100

## 2014-12-16 MED ORDER — IBUPROFEN 600 MG PO TABS
600.0000 mg | ORAL_TABLET | Freq: Four times a day (QID) | ORAL | Status: DC
  Administered 2014-12-16 – 2014-12-18 (×7): 600 mg via ORAL
  Filled 2014-12-16 (×7): qty 1

## 2014-12-16 SURGICAL SUPPLY — 33 items
BARRIER ADHS 3X4 INTERCEED (GAUZE/BANDAGES/DRESSINGS) IMPLANT
BENZOIN TINCTURE PRP APPL 2/3 (GAUZE/BANDAGES/DRESSINGS) ×2 IMPLANT
CLAMP CORD UMBIL (MISCELLANEOUS) IMPLANT
CLOTH BEACON ORANGE TIMEOUT ST (SAFETY) ×2 IMPLANT
DRAPE SHEET LG 3/4 BI-LAMINATE (DRAPES) IMPLANT
DRSG OPSITE POSTOP 4X10 (GAUZE/BANDAGES/DRESSINGS) ×2 IMPLANT
DURAPREP 26ML APPLICATOR (WOUND CARE) ×2 IMPLANT
ELECT REM PT RETURN 9FT ADLT (ELECTROSURGICAL) ×2
ELECTRODE REM PT RTRN 9FT ADLT (ELECTROSURGICAL) ×1 IMPLANT
EXTRACTOR VACUUM KIWI (MISCELLANEOUS) IMPLANT
GLOVE BIO SURGEON STRL SZ 6.5 (GLOVE) ×2 IMPLANT
GLOVE BIOGEL PI IND STRL 7.0 (GLOVE) ×2 IMPLANT
GLOVE BIOGEL PI INDICATOR 7.0 (GLOVE) ×2
GOWN STRL REUS W/TWL LRG LVL3 (GOWN DISPOSABLE) ×4 IMPLANT
KIT ABG SYR 3ML LUER SLIP (SYRINGE) IMPLANT
NEEDLE HYPO 22GX1.5 SAFETY (NEEDLE) IMPLANT
NEEDLE HYPO 25X5/8 SAFETYGLIDE (NEEDLE) IMPLANT
NS IRRIG 1000ML POUR BTL (IV SOLUTION) ×2 IMPLANT
PACK C SECTION WH (CUSTOM PROCEDURE TRAY) ×2 IMPLANT
PAD ABD 7.5X8 STRL (GAUZE/BANDAGES/DRESSINGS) ×4 IMPLANT
PAD OB MATERNITY 4.3X12.25 (PERSONAL CARE ITEMS) ×2 IMPLANT
PENCIL SMOKE EVAC W/HOLSTER (ELECTROSURGICAL) ×2 IMPLANT
RETRACTOR WND ALEXIS 25 LRG (MISCELLANEOUS) IMPLANT
RTRCTR WOUND ALEXIS 25CM LRG (MISCELLANEOUS)
SPONGE GAUZE 4X4 12PLY STER LF (GAUZE/BANDAGES/DRESSINGS) ×2 IMPLANT
STRIP CLOSURE SKIN 1/2X4 (GAUZE/BANDAGES/DRESSINGS) ×2 IMPLANT
SUT VIC AB 0 CT1 36 (SUTURE) ×10 IMPLANT
SUT VIC AB 2-0 CT1 27 (SUTURE) ×1
SUT VIC AB 2-0 CT1 TAPERPNT 27 (SUTURE) ×1 IMPLANT
SUT VIC AB 4-0 PS2 27 (SUTURE) ×2 IMPLANT
SYR CONTROL 10ML LL (SYRINGE) IMPLANT
TOWEL OR 17X24 6PK STRL BLUE (TOWEL DISPOSABLE) ×2 IMPLANT
TRAY FOLEY CATH SILVER 14FR (SET/KITS/TRAYS/PACK) IMPLANT

## 2014-12-16 NOTE — ED Notes (Signed)
[facility] arrived to [facility].

## 2014-12-16 NOTE — ED Notes (Signed)
Dr. Ferguson at bedside. 

## 2014-12-16 NOTE — Progress Notes (Signed)
Received patient via stretcher from [facility]. When transferred to bed noted bright red blood and clots. Dr. Rockie NeighboursWoak at bedside, performed US, baby transverse lie. Dr. Debroah LoopArnold called to evaluate. Adjusted EFM due to difficulty tracing FHR.

## 2014-12-16 NOTE — Lactation Note (Signed)
This note was [facility] the [facility]. Lactation Consultation Note  Patient Name: Kelly Villarreal ZOXWR'UToday's Date: 12/16/2014 Reason for consult: Initial assessment;NICU baby  NICU baby 5 hours old, 6527w5d. Mom has family in room and states that she does not want to start pumping at this time. Discussed importance of starting to pump as soon as she is feeling up to it. Mom is currently on a PCA and states that she will let her bedside RN know when she is feeling well enough to begin pumping. Set DEBP and all supplies in room and enc mom to start as soon as she is able. Discussed situation with patient's bedside RN, Thayer OhmChris. Maternal Data    Feeding    LATCH Score/Interventions                      Lactation Tools Discussed/Used Pump Review: Setup, frequency, and cleaning;Milk Storage Initiated by:: bedside RN Date initiated:: 12/16/14   Consult Status Consult Status: Follow-up Date: 12/17/14 Follow-up type: In-patient    [name], [name] 12/16/2014, 4:03 PM

## 2014-12-16 NOTE — ED Notes (Signed)
RROB called for efm; Pt reports that she started cramping and feeling pressure at 0700/0730, no vaginal bleeding, feeling baby move; pt's water broke at 0835, clear fluid. Dr Emelda FearFerguson called and is at bedside to assess and discuss plan of care.

## 2014-12-16 NOTE — ED Notes (Signed)
[facility] to inform of patient and to monitor.

## 2014-12-16 NOTE — ED Notes (Signed)
RROB spoke with Aundra MilletMegan, APED-RN, pt using bedpan at this time not tracing fhr, RROB asked if pulse ox can be placed on pt to distinguish between mom and baby's hr and for pt to be turned on her side due to decels in fhr, RN said when pt is off bedpan she will try to get pt on left side and adjust monitors

## 2014-12-16 NOTE — Anesthesia Preprocedure Evaluation (Signed)
Anesthesia Evaluation  Patient identified by MRN, date of birth, ID band Patient awake    Reviewed: Allergy & Precautions, NPO status , Patient's Chart, lab work & pertinent test resultsPreop documentation limited or incomplete due to emergent nature of procedure.  History of Anesthesia Complications Negative for: history of anesthetic complications  Airway Mallampati: III  TM Distance: >3 FB Neck ROM: Full    Dental no notable dental hx. (+) Dental Advisory Given   Pulmonary neg pulmonary ROS,    Pulmonary exam normal breath sounds clear to auscultation       Cardiovascular negative cardio ROS Normal cardiovascular exam Rhythm:Regular Rate:Normal     Neuro/Psych PSYCHIATRIC DISORDERS Anxiety Depression negative neurological ROS     GI/Hepatic negative GI ROS, Neg liver ROS,   Endo/Other  negative endocrine ROS  Renal/GU negative Renal ROS  negative genitourinary   Musculoskeletal negative musculoskeletal ROS (+)   Abdominal   Peds negative pediatric ROS (+)  Hematology negative hematology ROS (+)   Anesthesia Other Findings   Reproductive/Obstetrics (+) Pregnancy                             Anesthesia Physical Anesthesia Plan  ASA: III and emergent  Anesthesia Plan: General   Post-op Pain Management:    Induction: Intravenous, Rapid sequence and Cricoid pressure planned  Airway Management Planned: Oral ETT  Additional Equipment:   Intra-op Plan:   Post-operative Plan:   Informed Consent: I have reviewed the patients History and Physical, chart, labs and discussed the procedure including the risks, benefits and alternatives for the proposed anesthesia with the patient or authorized representative who has indicated his/her understanding and acceptance.   Dental advisory given  Plan Discussed with:   Anesthesia Plan Comments:         Anesthesia Quick Evaluation

## 2014-12-16 NOTE — Anesthesia Postprocedure Evaluation (Signed)
Anesthesia Post Note  Patient: Kelly Villarreal  Procedure(s) Performed: Procedure(s) (LRB): CESAREAN SECTION (N/A)  Patient location during evaluation: PACU Anesthesia Type: General Level of consciousness: awake and alert Pain management: pain level controlled Vital Signs Assessment: post-procedure vital signs reviewed and stable Respiratory status: spontaneous breathing, nonlabored ventilation, respiratory function stable and patient connected to nasal cannula oxygen Cardiovascular status: blood pressure returned to baseline and stable Postop Assessment: no signs of nausea or vomiting Anesthetic complications: no    Last Vitals:  Filed Vitals:   12/16/14 1230 12/16/14 1245  BP: 113/70 114/66  Pulse: 75 69  Temp:    Resp: 12 12    Last Pain:  Filed Vitals:   12/16/14 1300  PainSc: 5                  [name]

## 2014-12-16 NOTE — ED Notes (Signed)
RROB spoke with Dr Emelda FearFerguson, told of fhr with variables; Dr said that pt is getting an ultrasound at this time, cervix appears closed, pt did SROM, being started on Magnesium Sulfate,  [facility] Dr Emelda FearFerguson will [facility]

## 2014-12-16 NOTE — Anesthesia Procedure Notes (Signed)
Procedure Name: Intubation Performed by: Karie SchwalbeJUDD, Siarra Gilkerson Pre-anesthesia Checklist: Patient identified, Emergency Drugs available, Suction available and Patient being monitored Patient Re-evaluated:Patient Re-evaluated prior to inductionOxygen Delivery Method: Circle System Utilized Preoxygenation: Pre-oxygenation with 100% oxygen Intubation Type: IV induction Laryngoscope Size: Mac and 3 Grade View: Grade II Tube type: Oral Tube size: 7.0 mm Number of attempts: 1 Airway Equipment and Method: Stylet and Oral airway Placement Confirmation: ETT inserted through vocal cords under direct vision,  positive ETCO2 and breath sounds checked- equal and bilateral Secured at: 21 cm Tube secured with: Tape Dental Injury: Teeth and Oropharynx as per pre-operative assessment

## 2014-12-16 NOTE — Op Note (Signed)
Cesarean Section Operative Report  [facility] Kelly Villarreal  12/16/2014  Indications: abruption, pprom, transverse fetal lie  Pre-operative Diagnosis: abruption, pprom, transverse fetal lie  Post-operative Diagnosis: Same; classical hysterotomy  Surgeon: Surgeon(s) and Role:    * Adam PhenixJames G Arnold, MD - Primary     * Shonna ChockNoah Odis Turck, MD - OB fellow  Attending Attestation: I was present and scrubbed for the entire procedure.   Assistants: none  Anesthesia: general    Estimated Blood Loss: 300 ml during the procedure, unknown amount prior  Total IV Fluids: 2000 ml   Urine Output:: 700 ml clear yellow urine  Specimens: placenta to pathology  Findings: Viable female infant in transverse presentation; Apgars 1/7; weight 860 g; arterial cord pH 7.249; clear amniotic fluid; intact placenta with three vessel cord; normal uterus, fallopian tubes and ovaries bilaterally. Classical hysterotomy secondary to undeveloped lower uterine segment.  Baby condition / location:  NICU   Complications: no complications  Indications: Kelly Villarreal is a 24 y.o. G1P0101 with an IUP 11067w5d presenting with transferred from outside ED where presented with PPROM and abruption. Found to be transverse at that hospital and on arrival to our hospital. Fetal heart tones less than 100 bpm. Decision made to proceed to STAT cesarean section.  The risks, benefits, complications, treatment options, and expected outcomes were discussed with the patient . The patient concurred with the proposed plan, giving informed consent. identified as Kelly Villarreal and the procedure verified as C-Section Delivery.  Procedure Details:  The patient was taken back to the operative suite where general anesthesia was placed.  A time out was held and the above information confirmed.    After induction of anesthesia, the patient was draped and prepped in the usual sterile manner and placed in a dorsal supine position with a leftward tilt. A Pfannenstiel incision  was made and sharply carried down through the subcutaneous tissue to the fascia. Fascial incision was made and sharply extended transversely. The fascia was separated from the underlying rectus tissue superiorly and inferiorly, bluntly.  The peritoneum was identified and bluntly entered and extended longitudinally. Bladder blade was placed. A classical uterine incision was made and extended inferiorly and superiorly with bandage scissors. Fetal lie was transverse was delivered via breech presentation; viable infant with Apgars and weight as above. Cord immediately clamped and cut and infant transferred to NICU team. Cord blood was obtained for evaluation. Cord ph was sent. The placenta was removed Intact and appeared normal and will be sent to pathology. The uterine outline, tubes and ovaries appeared normal}. The uterine incision was closed in three layers, all with 0 vicryl. The first muscle layer was closed with running locked sutures. The second muscle layer was closed with running not locked sutures. Serosal layer was closed with baseball stitch. Bovie cauterie and one figure of 8 suture with 0 vicryl were used for hemostasis.   Hemostasis was observed. The peritoneum was closed with 0 vicryl. The rectus muscles were examined and hemostasis observed. The fascia was then reapproximated with running sutures of 0Vicryl. No subcuticular closure. The skin was closed with 0 vicryl.   Instrument, sponge, and needle counts were correct prior the abdominal closure and were correct at the conclusion of the case.     Disposition: PACU - hemodynamically stable.   Maternal Condition: stable       Signed: Cherrie Gauzeoah B WoukMD 12/16/2014 11:18 AM

## 2014-12-16 NOTE — H&P (Signed)
LABOR AND DELIVERY ADMISSION HISTORY AND PHYSICAL NOTE  Lashika Katrinka Blazing is a 24 y.o. female G1P0101 with IUP at [redacted]w[redacted]d by L/7 presenting transferred from outside ED. Presented there grossly ruptured and with vaginal bleeding. Found to be transverse on bedside ultrasound. Magnesium and ampicillin started. Betamethasone not available. Transferred to our facility. On arrival moderate vaginal bleeding.   Prenatal History/Complications:  Past Medical History: Past Medical History  Diagnosis Date  . Allergy     seasonal  . Anxiety 03/08/2011  . Depression 03/08/2011  . Threatened miscarriage in early pregnancy 08/10/2014  . Nausea and vomiting during pregnancy 08/10/2014  . Vaginal bleeding in pregnancy 08/10/2014    Past Surgical History: Past Surgical History  Procedure Laterality Date  . Mouth surgery      Obstetrical History: OB History    Gravida Para Term Preterm AB TAB SAB Ectopic Multiple Living   0 1      Social History: Social History   Social History  . Marital Status: Single    Spouse Name: N/A  . Number of Children: N/A  . Years of Education: N/A   Social History Main Topics  . Smoking status: Never Smoker   . Smokeless tobacco: Never Used  . Alcohol Use: No  . Drug Use: No  . Sexual Activity: Yes    Birth Control/ Protection: None   Other Topics Concern  . None   Social History Narrative    Family History: Family History  Problem Relation Age of Onset  . Hypertension Father   . Heart disease Maternal Grandmother   . Diabetes Maternal Grandfather   . Hypertension Maternal Grandfather   . Glaucoma Paternal Grandmother     Allergies: No Known Allergies  Prescriptions prior to admission  Medication Sig Dispense Refill Last Dose  . acetaminophen (TYLENOL) 325 MG tablet Take 650 mg by mouth every 6 (six) hours as needed.   Taking  . cetirizine (ZYRTEC) 10 MG tablet Take 10 mg by mouth daily.   Taking  . Doxylamine-Pyridoxine (DICLEGIS) 10-10 MG  TBEC 2 tabs q hs, if sx persist add 1 tab q am on day 3, if sx persist add 1 tab q afternoon on day 4 100 tablet 6 Not Taking  . Prenatal Vit-Fe Fumarate-FA (PRENATAL VITAMINS) 28-0.8 MG TABS Take 1 tablet by mouth daily. 30 tablet 3 Taking     Review of Systems   All systems reviewed and negative except as stated in HPI  Blood pressure 126/70, pulse 101, temperature 98.3 F (36.8 C), resp. rate 20, last menstrual period 07/02/2014, SpO2 100 %, unknown if currently breastfeeding. General appearance: alert, cooperative and appears stated age Lungs: clear to auscultation bilaterally Heart: regular rate and rhythm Abdomen: soft, non-tender; bowel sounds normal Extremities: No calf swelling or tenderness Presentation: transverse Fetal monitoring: FHR less than 100 bpm      Prenatal labs: ABO, Rh: --/--/O NEG, O NEG (11/30 1025) Antibody: NEG (11/30 1025) Rubella: !Error!immune RPR: Non Reactive (08/16 1646)  HBsAg: Negative (08/16 1646)  HIV: Non Reactive (08/16 1646)  GBS:   unknown 1 hr Glucola not performed Genetic screening  First wnl Anatomy US wnl  Prenatal Transfer Tool  Maternal Diabetes: unknoown Genetic Screening: Normal Maternal Ultrasounds/Referrals: Normal Fetal Ultrasounds or other Referrals:  None Maternal Substance Abuse:  No Significant Maternal Medications:  None Significant Maternal Lab Results: Lab values include: Other: gbs unknown  Results for orders placed or performed during the hospital  encounter of 12/16/14 (from the past 24 hour(s))  Prepare RBC   Collection Time: 12/16/14 10:09 AM  Result Value Ref Range   Order Confirmation ORDER PROCESSED BY BLOOD BANK   CBC   Collection Time: 12/16/14 10:25 AM  Result Value Ref Range   WBC 11.3 (H) 4.0 - 10.5 K/uL   RBC 2.79 (L) 3.87 - 5.11 MIL/uL   Hemoglobin 8.8 (L) 12.0 - 15.0 g/dL   HCT 69.626.1 (L) 29.536.0 - 28.446.0 %   MCV 93.5 78.0 - 100.0 fL   MCH 31.5 26.0 - 34.0 pg   MCHC 33.7 30.0 - 36.0 g/dL   RDW  13.212.9 44.011.5 - 10.215.5 %   Platelets 185 150 - 400 K/uL  Type and screen   Collection Time: 12/16/14 10:25 AM  Result Value Ref Range   ABO/RH(D) O NEG    Antibody Screen NEG    Sample Expiration 12/19/2014   ABO/Rh   Collection Time: 12/16/14 10:25 AM  Result Value Ref Range   ABO/RH(D) O NEG     Patient Active Problem List   Diagnosis Date Noted  . Placental abruption 12/16/2014  . Supervision of normal first pregnancy 09/01/2014  . Rh negative state in antepartum period 09/01/2014  . Threatened miscarriage in early pregnancy 08/10/2014  . Nausea and vomiting during pregnancy 08/10/2014  . Vaginal bleeding in pregnancy 08/10/2014    Assessment: [name] is a 24 y.o. G1P0101 at 7118w5d here with pprom, transverse fetal lie, and abruption, with nrfhts. Plan for stat c/s. Will give betamethasone and continue ampicillin and magnesium for neuroprotection.  #MOF: breast, likely formula supplementation #MOC:nexplanon #Circ:  Yes #Rh negative: will w/u and give rhogam if indicated # pprom, abruption: will check UDS  Silvano Bilisoah B Zahriah Roes 12/16/2014, 11:25 AM

## 2014-12-16 NOTE — Transfer of Care (Signed)
Immediate Anesthesia Transfer of Care Note  Patient: Kelly Villarreal  Procedure(s) Performed: Procedure(s): CESAREAN SECTION (N/A)  Patient Location: PACU  Anesthesia Type:General  Level of Consciousness: awake and sedated  Airway & Oxygen Therapy: Patient Spontanous Breathing and Patient connected to nasal cannula oxygen  Post-op Assessment: Report given to RN and Post -op Vital signs reviewed and stable  Post vital signs: Reviewed and stable  Last Vitals:  Filed Vitals:   12/16/14 0917 12/16/14 1126  BP: 126/70   Pulse: 101   Temp:  36.4 C  Resp:      Complications: No apparent anesthesia complications

## 2014-12-16 NOTE — ED Provider Notes (Signed)
CSN: [id number]     Arrival date & time 12/16/14  0820 History   First MD Initiated Contact with Patient 12/16/14 [phone]     No chief complaint on file.    (Consider location/radiation/quality/duration/timing/severity/associated sxs/prior Treatment) Patient is a 24 y.o. female presenting with abdominal pain. The history is provided by the patient. No language interpreter was used.  Abdominal Pain Pain location:  Suprapubic Pain quality: aching   Pain radiates to:  Does not radiate Pain severity:  Moderate Duration:  1 hour Timing:  Constant Progression:  Worsening Chronicity:  New Context: not recent illness   Relieved by:  Nothing Worsened by:  Nothing tried Ineffective treatments:  None tried Associated symptoms: no fever   Risk factors: has not had multiple surgeries    Pt is 24.[redacted] weeks pregnant.  G1.   Pt sees Dr. Emelda Fear.  This has been a normal pregnancy to date. Pt complains of severe cramping.  Pt had a large amount of fluid in wheelchair and floor when arriving to room.  Past Medical History  Diagnosis Date  . Allergy     seasonal  . Anxiety 03/08/2011  . Depression 03/08/2011  . Threatened miscarriage in early pregnancy 08/10/2014  . Nausea and vomiting during pregnancy 08/10/2014  . Vaginal bleeding in pregnancy 08/10/2014   Past Surgical History  Procedure Laterality Date  . Mouth surgery     Family History  Problem Relation Age of Onset  . Hypertension Father   . Heart disease Maternal Grandmother   . Diabetes Maternal Grandfather   . Hypertension Maternal Grandfather   . Glaucoma Paternal Grandmother    Social History  Substance Use Topics  . Smoking status: Never Smoker   . Smokeless tobacco: Never Used  . Alcohol Use: No   OB History    Gravida Para Term Preterm AB TAB SAB Ectopic Multiple Living   1              Review of Systems  Constitutional: Negative for fever.  Gastrointestinal: Positive for abdominal pain.  All other systems reviewed  and are negative.     Allergies  Review of patient's allergies indicates no known allergies.  Home Medications   Prior to Admission medications   Medication Sig Start Date End Date Taking? Authorizing Provider  acetaminophen (TYLENOL) 325 MG tablet Take 650 mg by mouth every 6 (six) hours as needed.    Historical Provider, MD  cetirizine (ZYRTEC) 10 MG tablet Take 10 mg by mouth daily.    Historical Provider, MD  Doxylamine-Pyridoxine (DICLEGIS) 10-10 MG TBEC 2 tabs q hs, if sx persist add 1 tab q am on day 3, if sx persist add 1 tab q afternoon on day 4 Patient not taking: Reported on 12/01/2014 09/01/14   Cheral Marker, CNM  Prenatal Vit-Fe Fumarate-FA (PRENATAL VITAMINS) 28-0.8 MG TABS Take 1 tablet by mouth daily. 08/06/14   Vanetta Mulders, MD   LMP 07/02/2014 Physical Exam  Constitutional: She is oriented to person, place, and time. She appears well-developed and well-nourished.  HENT:  Head: Normocephalic and atraumatic.  Eyes: EOM are normal.  Neck: Normal range of motion.  Cardiovascular: Normal rate and normal heart sounds.   Pulmonary/Chest: Effort normal.  Abdominal: Soft. She exhibits no distension. There is no tenderness.  Genitourinary:  Uterus enlarged 20 plus,  fht 150's, exam no obvious crowning, small amount of bright red blood, large amount of clear fluid floor, in chair and floor   Musculoskeletal: Normal range of  motion.  Neurological: She is alert and oriented to person, place, and time.  Skin: Skin is warm.  Psychiatric: She has a normal mood and affect.  Nursing note and vitals reviewed.   ED Course  Procedures (including critical care time) Labs Review Labs Reviewed - No data to display  Imaging Review No results found. I have personally reviewed and evaluated these images and lab results as part of my medical decision-making.   EKG Interpretation None      MDM  I spoke to Dr. Emelda FearFerguson who will see here.  [facility].  Pt started on mag drip, ampicillian per Dr. Emelda FearFerguson.     Final diagnoses:  SPROM (prolonged spontaneous rupture of membranes)        Elson AreasLeslie K Sofia, PA-C 12/16/14 [id number]  Donnetta HutchingBrian Cook, MD 12/17/14 [phone]

## 2014-12-16 NOTE — Progress Notes (Signed)
  History   [facility] abdominal discomfort since 7 am, developed Premature Preterm rupture of membranes in ED,  And then vaginal bleeding. No cramping at present.  CSN: [id number]  Arrival date and time: 12/16/14 [id number]   None     Chief Complaint  Patient presents with  . Abdominal Pain   HPI This G1P0 at 4755w5d  [facility] with abdominal cramping, since 7 am. [facility] she had gross rupture of membranes, and has now begun to bleed, with speculum exam showing at least 200 cc of bright blood. Cervix is closed. Bedside u/s shows a back up transverse lie to fetus, with posterior low lying placenta, down to internal os, and confirms oligohydramnios. Ext monitor shows no active contractions, and intermittent decelerations  X 15-20 secs, with prompt recovery of FHR.  Orders initiated for Betamethasone , not available at this time, Mag Sulfate initiated, Ampicillin 2 g administered, Azithromycin not given (expedited transfer). Case discussed with Dr Debroah LoopArnold who accepts pt in transfer. [facility] arranged, EMTALA FORMS completed by Eustace QuailMegan Mitchell.    Past Medical History  Diagnosis Date  . Allergy     seasonal  . Anxiety 03/08/2011  . Depression 03/08/2011  . Threatened miscarriage in early pregnancy 08/10/2014  . Nausea and vomiting during pregnancy 08/10/2014  . Vaginal bleeding in pregnancy 08/10/2014    Past Surgical History  Procedure Laterality Date  . Mouth surgery      Family History  Problem Relation Age of Onset  . Hypertension Father   . Heart disease Maternal Grandmother   . Diabetes Maternal Grandfather   . Hypertension Maternal Grandfather   . Glaucoma Paternal Grandmother     Social History  Substance Use Topics  . Smoking status: Never Smoker   . Smokeless tobacco: Never Used  . Alcohol Use: No    Allergies: No Known Allergies   (Not in a hospital admission)  ROS Physical Exam    Last menstrual period 07/02/2014. BP 126/70 mmHg  Pulse 101  Temp(Src) 98.3 F (36.8 C)  Resp 20  SpO2 100%  LMP 07/02/2014  Physical Exam  Constitutional: She appears well-nourished.  HENT:  Head: Normocephalic.  Eyes: Pupils are equal, round, and reactive to light.  Cardiovascular: Normal rate.   Respiratory: Effort normal.  GI: Soft. There is no tenderness.  Uterus is nontender, at U+2. Bedside u/s by me shows back up transverse fetal lie, vertex to maternal left, oligohydramnios, posterior placenta , low lying , with apparent long closed cervix.   Genitourinary:  Perineum some lite blood present. Speculum : vault filled with blood,     MAU Course  Procedures  MDM Assess, transfer arrangement  Assessment and Plan  PPROM 24w5 d Fetal malpresentation back up transverse Low Lying Placenta, with bleeding suspect marginal abruption p ROM  Plan: Transfer arranged, accepted by Dr Debroah LoopArnold,            Ampicillin and Mag Sulfate initiated           Betamethasone not available at time of transfer           Azithromycin not available at time of transfer. Dearion Huot V 12/16/2014, 9:11 AM

## 2014-12-16 NOTE — Progress Notes (Signed)
Voided large amount of urine on towel

## 2014-12-16 NOTE — ED Notes (Signed)
Pt started having abdominal pain this morning at 0730. Pt's significant other felt pt's belly and said he felt "contractions". Pt brought to ED and water broke as she was being placed into ED room. Fluid was yellow in color. Pt is 24 weeks and 5 days pregnant with first child, female. Pt says her abdominal pain has went away after her water broke. Pt feeling no pain currently.

## 2014-12-17 ENCOUNTER — Encounter (HOSPITAL_COMMUNITY): Payer: Self-pay | Admitting: Obstetrics & Gynecology

## 2014-12-17 LAB — RPR: RPR: NONREACTIVE

## 2014-12-17 LAB — CBC
HCT: 24.2 % — ABNORMAL LOW (ref 36.0–46.0)
Hemoglobin: 8 g/dL — ABNORMAL LOW (ref 12.0–15.0)
MCH: 31.9 pg (ref 26.0–34.0)
MCHC: 33.1 g/dL (ref 30.0–36.0)
MCV: 96.4 fL (ref 78.0–100.0)
PLATELETS: 194 10*3/uL (ref 150–400)
RBC: 2.51 MIL/uL — ABNORMAL LOW (ref 3.87–5.11)
RDW: 12 % (ref 11.5–15.5)
WBC: 24.5 10*3/uL — ABNORMAL HIGH (ref 4.0–10.5)

## 2014-12-17 MED ORDER — RHO D IMMUNE GLOBULIN 1500 UNIT/2ML IJ SOSY
300.0000 ug | PREFILLED_SYRINGE | Freq: Once | INTRAMUSCULAR | Status: AC
  Administered 2014-12-17: 300 ug via INTRAVENOUS
  Filled 2014-12-17: qty 2

## 2014-12-17 NOTE — Lactation Note (Signed)
This note was copied from the chart of [facility]. Lactation Consultation Note  Patient Name: Kelly Villarreal ZOXWR'UToday's Date: 12/17/2014 Reason for consult: Follow-up assessment;NICU baby  NICU baby 2826 hours old, 4449w6d CGA. Mom using DEBP when this LC entered room. Mom has colostrum flowing from left breast. Parents states that this is the second time mom has used the DEBP. Discussed pumping schedule, and enc to pump 8 times/24 hours for 15 minutes followed by hand expression. Reviewed EBM storage guidelines. Enc STS/Kangaroo care and nuzzling at breast as baby able. Discussed normal progression of breastmilk coming to volume. [facility] [place] as parents states mom is active and receiving vouchers. [facility] well.  Maternal Data Has patient been taught Hand Expression?: Yes (Per mom.) Does the patient have breastfeeding experience prior to this delivery?: No  Feeding    LATCH Score/Interventions                      Lactation Tools Discussed/Used WIC Program: Yes   Consult Status Consult Status: Follow-up Date: 12/18/14 Follow-up type: In-patient    [name], Sunshyne Horvath 12/17/2014, 12:45 PM

## 2014-12-17 NOTE — Addendum Note (Signed)
Addendum  created 12/17/14 0856 by Algis GreenhouseLinda A Leanor Voris, CRNA   Modules edited: Clinical Notes   Clinical Notes:  File: [id number]

## 2014-12-17 NOTE — Clinical Social Work Maternal (Signed)
CLINICAL SOCIAL WORK MATERNAL/CHILD NOTE  Patient Details  Name: Kelly Villarreal MRN: [id number] Date of Birth: November 07, 1990  Date:  12/17/2014  Clinical Social Worker Initiating Note:  Pantelis Elgersma E. Brigitte Pulse, [place] Date/ Time Initiated:  12/17/14/1330     Child's Name:  Kelly Villarreal   Legal Guardian:   (Parents: Kelly Villarreal and Kelly Villarreal)   Need for Interpreter:  None   Date of Referral:  12/17/14     Reason for Referral:  Parental Support of Premature Babies < 80 weeks/or Critically Ill babies    Referral Source:  RN   Address:  [address] [place], [place] [address]  Phone number:  [phone]   Household Members:  Significant Other, Minor Children (FOB's 44 year old son stays with MOB and FOB part of the time.)   Natural Supports (not living in the home):  Friends, Immediate Family (MOB reports that his sister also lives in [place] and is supportive.  He states, "she basically raised me."  Maternal grandparents are supportive and live in [place].  )   Professional Supports:     Employment:     Type of Work:     Education:      Pensions consultant:  [employer]   Other Resources:  [employer]   Cultural/Religious Considerations Which May Impact Care: None stated  Strengths:  Ability to meet basic needs , Compliance with medical plan , Understanding of illness (Did not discuss pediatrician or supplies for infant at initial meeting.)   Risk Factors/Current Problems:      Cognitive State:  Alert , Linear Thinking , Goal Oriented , Insightful    Mood/Affect:  Calm , Interested , Other (Comment) (MOB reports she is physically in a lot of pain.)   CSW Assessment: CSW met with parents in MOB's third floor room/313 to introduce services, offer support and complete assessment due to baby's admission to NICU at 24.5 weeks.  Assessment was fairly brief given baby's critical medical status and focused mostly around the balance between [place] and fear.  Parents seem very aware of  baby's unstable condition, although MOB especially seems very positive at this time.  She reports things are going "well" today.  She also appears to still be in some state of shock and FOB added that he does not feel it has really "hit her yet."  They both agree that they have worry for their son, but FOB states he has had a lot of loss and trauma and has learned that stress and worry don't impact the outcome of a situation and only end up causing more pain.  He states he is thankful for his son's life and every hour he has survived so far.  FOB spoke highly of the staff who have taken care of MOB and baby, stating "it has been eye opening and amazing to see the care they provide."  He is extremely grateful for the care they have and are receiving here.  FOB states the Chaplain/Katy Franne Grip was with him "every step of the way" yesterday on an extremely difficult day and that he is also grateful for her.   Parents appear very loving and supportive towards each other.  When asked about their support system, FOB replied, "I have [place] (MOB).  That's all I need."  They informed CSW that they have been together nearly 3 years and that they met each other at points in their lives when they both had nothing.  FOB states they have built each other up  and built a life for themselves.  MOB is from [place] and explained that they moved here (in 2015 according to MOB's chart) to be close to FOB's father who is battling stage 39 cancer.  FOB states he is "kind of in remission," but is still experiencing many complications.  FOB has two other children, Sophia/age 38-living in [place] with her grandfather due to "behavior issues," and Jaydon-age 39 who spends part of the time with FOB and MOB and the other part of the time with his mother/FOB's ex-wife in [place].  FOB spoke about how much it would mean to him if his son could meet the baby.  CSW explained that our current policy does not allow for this visit, but was  understanding of the critical nature of the situation and spoke to MD following assessment about this request.   CSW explained ongoing support services offered by NICU CSW, which couple seemed very receptive to.  CSW also mentioned support services through [employer] and the Spiritual Care Department.  CSW will make referral to [facility]SN for a sibling bag for Jaydon to help him feel included and important.  CSW provided contact information and asked parents to call any time.  MOB asked about the visitation policy for family and friends, which CSW explained.  CSW plans to follow up at a later time as appropriate regarding SSI, pediatric follow up, preparations for infant and MOB's documented hx of Anxiety and Depression.    CSW Plan/Description:  Patient/Family Education , Psychosocial Support and Ongoing Assessment of Needs    Alphonzo Cruise, [place] 12/17/2014, 4:16 PM

## 2014-12-17 NOTE — Progress Notes (Signed)
Subjective: Postpartum Day #1: Cesarean Delivery Patient reports tolerating PO and no problems voiding; pt just got word that infant's status is not as stable as previously; contraception not discussed  Objective: Vital signs in last 24 hours: Temp:  [97.5 F (36.4 C)-98.3 F (36.8 C)] 97.7 F (36.5 C) (12/01 0615) Pulse Rate:  [69-101] 73 (12/01 0428) Resp:  [11-20] 16 (12/01 0428) BP: (98-126)/(53-82) 99/57 mmHg (12/01 0615) SpO2:  [100 %] 100 % (12/01 0428) Weight:  [61.689 kg (136 lb)] 61.689 kg (136 lb) (11/30 1523)  Physical Exam:  General: alert, cooperative and no distress Lochia: appropriate Uterine Fundus: firm Incision: dressing intact, clean, dry DVT Evaluation: No evidence of DVT seen on physical exam.   Recent Labs  12/16/14 1618 12/17/14 0605  HGB 9.2* 8.0*  HCT 27.2* 24.2*    Assessment/Plan: Status post Cesarean section. Doing well postoperatively.  Continue current care.  Kelly Villarreal, Kelly Villarreal CNM 12/17/2014, 8:13 AM

## 2014-12-17 NOTE — Anesthesia Postprocedure Evaluation (Signed)
Anesthesia Post Note  Patient: Kelly Villarreal  Procedure(s) Performed: Procedure(s) (LRB): CESAREAN SECTION (N/A)  Patient location during evaluation: [facility] Anesthesia Type: General Level of consciousness: awake Pain management: satisfactory to patient Vital Signs Assessment: post-procedure vital signs reviewed and stable Respiratory status: spontaneous breathing Cardiovascular status: stable Postop Assessment: adequate PO intake and no signs of nausea or vomiting Anesthetic complications: no    Last Vitals:  Filed Vitals:   12/17/14 0428 12/17/14 0615  BP:  99/57  Pulse: 73   Temp:  36.5 C  Resp: 16     Last Pain:  Filed Vitals:   12/17/14 0655  PainSc: 6                  Javed Cotto

## 2014-12-18 LAB — RH IG WORKUP (INCLUDES ABO/RH)
ABO/RH(D): O NEG
Fetal Screen: NEGATIVE
GESTATIONAL AGE(WKS): 24
Unit division: 0

## 2014-12-18 MED ORDER — IBUPROFEN 600 MG PO TABS: 600.0000 mg | ORAL_TABLET | Freq: Four times a day (QID) | ORAL | Status: DC

## 2014-12-18 MED ORDER — ZOLPIDEM TARTRATE 5 MG PO TABS: 5.0000 mg | ORAL_TABLET | Freq: Every evening | ORAL | Status: DC | PRN

## 2014-12-18 MED ORDER — SERTRALINE HCL 25 MG PO TABS: 25.0000 mg | ORAL_TABLET | Freq: Every day | ORAL | Status: DC

## 2014-12-18 MED ORDER — OXYCODONE-ACETAMINOPHEN 5-325 MG PO TABS: 1.0000 | ORAL_TABLET | ORAL | Status: DC | PRN

## 2014-12-18 NOTE — Lactation Note (Signed)
This note was copied from the chart of Kelly Villarreal. Lactation Consultation Note  Patient Name: Kelly Villarreal ZOXWR'UToday's Date: 12/18/2014 Reason for consult: Follow-up assessment;NICU baby (Fetal Demise)   Follow up with mom after fetal demise overnight. Mom was holding infant "Kelly Villarreal" in her arms when I went into the room. Mom was quiet and void of emotion this morning. Advised her to wear tight bra, apply ice to breasts as needed for discomfort and to take Motrin if not allergic for pain. Mom voiced understanding. Enc mom to call with questions/concerns prn.   Maternal Data Formula Feeding for Exclusion: No  Feeding    LATCH Score/Interventions                      Lactation Tools Discussed/Used WIC Program: Yes   Consult Status Consult Status: Complete Follow-up type: Call as needed    Ed BlalockSharon S Kore Madlock 12/18/2014, 9:13 AM

## 2014-12-18 NOTE — Progress Notes (Cosign Needed)
Post Partum Day: 2   Subjective:  Kelly Villarreal is a 24 y.o. G1P0101 439w5d presenting pprom, transverse fetal lie, and abruption, with nrfhts with stat classical c/s.  No acute events overnight.  Pt denies problems with ambulating, voiding or po intake.  She denies nausea or vomiting.  Pain is moderately controlled.  She has had flatus. She has had bowel movement.  Lochia Minimal.  Plan for birth control is Nexplanon.  Patient's baby passed away early this AM   Objective: BP 111/57 mmHg  Pulse 96  Temp(Src) 98.6 F (37 C) (Oral)  Resp 18  Ht 5\' 6"  (1.676 m)  Wt 136 lb (61.689 kg)  BMI 21.96 kg/m2  SpO2 100%  LMP 07/02/2014  Breastfeeding? Unknown  Physical Exam:  General: alert, cooperative and no distress Lochia:normal flow Chest: CTAB Heart: RRR no m/r/g Abdomen: +BS, soft, nontender, fundus firm at/below umbilicus Uterine Fundus: firm,  DVT Evaluation: No evidence of DVT seen on physical exam. Extremities: no lower extremity edema   Recent Labs  12/16/14 1618 12/17/14 0605  HGB 9.2* 8.0*  HCT 27.2* 24.2*    Assessment/Plan:  ASSESSMENT: Kelly Villarreal is a 24 y.o. G1P0101 2339w5d ppd # 2 s/p classical C-section. Patient's baby passed away early this morning. Patient would like to stay one more day before going home to "face everything."   Plan for discharge tomorrow   LOS: 2 days   Gumaro Brightbill Z Lacretia Tindall 12/18/2014, 10:41 AM

## 2014-12-18 NOTE — Progress Notes (Signed)
Ch arrived to NICU as baby Able was passing. At the appropriate time, mom held baby and he was pronounced by pediatrician shortly after. Mom [facility](Areonna) was appropriately tearful, at times sobbing. Her SO [name](James) was very supportive. His parents joined them later and offered additional support. Faithe is from [place] and does not have family here. She blamed herself, said she would have been a good mother, and repeatedly said she did not to  want to let him go.Staff and I responded to her feelings of guilt; staff explained baby's condition.  At the appropriate time, had prayer w/pt. Ch coordinated several tasks w/pt and staff including retrieving funeral service information and cuddle cot.  Chaplain pamela Dillard Cannonarrington Holder

## 2014-12-18 NOTE — Progress Notes (Signed)
CSW met with MOB in her third floor room to offer support and condolences.  MOB was sitting on the edge of her bed, rocking back and forth and looking at infant lying next to her.  She was very quiet, but seemed appreciative of the visit.  Her "sister in law" was with her in the room and she said we could talk with her present.  MOB reports, "I just want to go home."  CSW stated understanding and assured that MOB was aware of follow up.  MOB acknowledged Chaplain visit early this morning.  She states she has been given [facility] information.  CSW told MOB and paternal aunt that we generally recommend involvement in that support group sometime between 3-5 months past the time of loss.  They stated understanding.  CSW encouraged MOB to reach out to [facility] for grief counseling CSW also discussed MOB's history of Depression and Anxiety noted in her chart, which she was very open about.  She reports she has never taken an antidepressant, but had a therapist when she lived in [place].  CSW asked if she would be interested in outpatient counseling information at this time and she agreed.  CSW provided her with information.  CSW discussed possibility of starting an antidepressant at this time given her history and current situation and she states she would like to start an antidepressant.  She gave permission for CSW to speak with her MD about this.  CSW spoke with Dr. Tedd Sias to make recommendation to start MOB on an antidepressant prior to discharge today.  Dr. Roselie Awkward in agreement.

## 2014-12-18 NOTE — Progress Notes (Signed)
Met with patient upon referral from nurse after pt stated that she was ready to leave the baby because she knew if she didn't do it now she would not be able to do it ever.  This was my first time meeting Ms Tamala Julian as she was pumping when I attempted to visit yesterday.  Pt was sitting on the bed and was rocking back and forth.  She said she was just very anxious to go.  Chaplain encouraged pt to verbalize her feelings about leaving without her baby and mother expressed her intense grief.  Chaplain encouraged pt to share about her pregnancy and her connection to her baby during pregnancy.  She shared that he was active and strong and she thought he would play football.  Pt shared that her delivery started very quickly and her delivery was scary and she under anesthesia.  She shared that the first time she was able to hold Carlyle Lipa was when he died in her arms.  She seemed somewhat comforted stating that he was at peace being with her again.  Chaplain questioned pt about feelings of guilt mentioned by Ree Edman, night chaplain, and she admitted that she was feeling guilt about the baby coming early and stated, "I didn't do anything wrong, I ate yogurt and fruit and I didn't take any medication without talking to the doctor.  I was eating healthy. I didn't do anything wrong."  Chaplain encouraged pt to listen to her own words, "I didn't do anything wrong.  I didn't do anything wrong."  Chaplain reminded Kamille that we don't always have answers and explanations and that no explanation would truly be enough to understand why she lost her baby so early and why she isn't able to take him home.  Chaplain encouraged pt to trust her emotions and listen to what her body was telling her about what she needed from other people during this painful season.  I explained resources for [facility], [facility], and the dept for spiritual care and wholeness and reminded pt that it's never too late to contact these agencies-we discussed  things that trigger grief and the ways it can bubble up later in life.  Chaplain walked pt to the exit with her nurse and shared that she was eager to lay down with her fiance and just be in her bed.  She shared that they [place] to bury Carlyle Lipa in a church cemetary near her house so she can visit him daily and expressed Jancie in making a "shrine" for him and making a Christmas ornament with his name on it. Donald Prose. Elyn Peers, M.Div. [facility] Chaplain Pager [phone] Office [phone]      12/18/14 1203  Clinical Encounter Type  Visited With Patient;Patient and family together  Visit Type Death  Referral From Nurse  Consult/Referral To Hospice  Spiritual Encounters  Spiritual Needs Emotional;Grief support  Stress Factors  Patient Stress Factors Loss  Family Stress Factors Loss

## 2014-12-18 NOTE — Progress Notes (Signed)
Pt out in wheelchair teaching and emotional support provided  For pt

## 2014-12-18 NOTE — Discharge Instructions (Signed)
Cesarean Delivery, Care After °Refer to this sheet in the next few weeks. These instructions provide you with information on caring for yourself after your procedure. Your health care provider may also give you specific instructions. Your treatment has been planned according to current medical practices, but problems sometimes occur. Call your health care provider if you have any problems or questions after you go home.  °WHAT TO EXPECT AFTER THE PROCEDURE  °After your procedure, it is typical to have the following: °· You may feel very fatigued. °· You may have pain at the incision site for several days. The incision will probably continue to be tender to the touch for several weeks. Your incision will take about 4-6 weeks to heal. °· You may have vaginal bleeding and discharge that will start out red, then become pink, then yellow, then white. Even though you did not have a vaginal delivery, you will still have vaginal bleeding and discharge. Altogether, this usually lasts for about 6 weeks. °· The combination of having lost your baby and changing hormones from the delivery can make you feel very sad. You may also experience emotions that change very quickly. Some of the emotions people notice after loss include: °¨ Anger. °¨ Denial. °¨ Guilt. °¨ Sorrow. °¨ Depression. °¨ Grief. °¨ Relationship problems. °HOME CARE INSTRUCTIONS  °· Consider seeking support for your loss. Some forms of support that you might consider include your religious leader, friends, family, a professional counselor, or a bereavement support group. °· Take medicines only as directed by your health care provider. °· Continue to use good perineal care. Good perineal care includes:   °¨ Wiping your perineum from front to back.   °¨ Keeping your perineum clean.   °· Check your incision daily for increased redness, drainage, swelling, or separation of skin.   °· Clean your incision gently with soap and water every day, and then pat it dry. If your  health care provider says it is okay, leave the incision uncovered. Use a bandage (dressing) if the incision is draining fluid or appears irritated. If the adhesive strips across the incision do not fall off within 7 days, carefully peel them off or as directed by your health care provider.   °· Hug a pillow when coughing or sneezing until your incision is healed. This helps to relieve pain.   °· Shower, wash your hair, and take tub baths as directed by your health care provider.   °· Wear a well-fitting bra that provides breast support.   °· Limit wearing support panties or control-top hose.   °· Drink enough fluids to keep your urine clear or pale yellow.   °· Eat high-fiber foods such as whole grain cereals and breads, brown rice, beans, and fresh fruits and vegetables every day. These foods may help prevent or relieve constipation.   °· Follow your health care provider's directions about resuming activities such as climbing stairs, driving, lifting, exercising, or traveling. °· Increase your activities gradually.   °· Talk to your health care provider about resuming sexual activities. This depends on your risk of infection, your rate of healing, and your comfort and desire to resume sexual activity.   °· Try to have someone help you with your household activities for at least a few days after you leave the hospital.   °· Rest as much as possible. °· Keep all of your scheduled postpartum appointments. It is very important to keep your scheduled follow-up appointments. At these appointments, your health care provider will be checking to make sure that you are healing physically and   emotionally.   °· Do not use tampons or douche until your health care provider says it is okay.   °· Do not drink alcohol, especially if you are taking medicine to relieve pain.   °· Do not use any tobacco products including cigarettes, chewing tobacco, or electronic cigarettes. If you need help quitting, ask your health care  provider. °· Do not use illegal drugs. °SEEK MEDICAL CARE IF:  °· You feel sad or depressed.   °· You have thoughts of hurting yourself. °· You are having trouble eating or sleeping. °· You cannot enjoy the things in life you have previously enjoyed. °· You are passing large clots from your vagina. Save any clots to show your health care provider. °· You have heavy red vaginal flow (like your usual menstrual period) for more than four days after your cesarean delivery. °· You have a foul smelling discharge from your vagina.   °· You have pain or trouble urinating or are urinating frequently.  °· You have a change in your bowel movements.   °· You have increasing redness, pain, or swelling near your incision.   °· You have pus draining from your incision.   °· Your incision is separating.   °· You have painful, hard, or reddened breasts.   °· You have a severe headache.   °· You have blurred vision or see spots.   °· You are dizzy or light-headed.   °· You have a rash.   °· You have pain, redness, or swelling at the site of the removed IV tube.   °· You have nausea or vomiting.   °· You have not had a menstrual period within 12 weeks of delivery.   °· You have a fever. °SEEK IMMEDIATE MEDICAL CARE IF:  °· You are concerned that you may hurt yourself or you are considering suicide.   °· You have persistent pain.   °· You have chest pain.   °· You have shortness of breath.   °· You faint.   °· You have leg pain or swelling.   °· You have stomach pain.   °· Your vaginal bleeding saturates 2 or more sanitary pads in 1 hour. °MAKE SURE YOU:  °· Understand these instructions. °· Will watch your condition. °· Will get help right away if you are not doing well or get worse. °  °This information is not intended to replace advice given to you by your health care provider. Make sure you discuss any questions you have with your health care provider. °  °Document Released: 05/19/2013 Document Reviewed: 05/19/2013 °Elsevier  Interactive Patient Education ©2016 [employer] ° °

## 2014-12-18 NOTE — Discharge Summary (Signed)
OB Discharge Summary     Patient Name: Kelly Villarreal DOB: November 12, 1990 MRN: [id number]  Date of admission: 12/16/2014 Delivering MD: Adam PhenixARNOLD, JAMES G   Date of discharge: 12/18/2014  Admitting diagnosis: SPROM (prolonged spontaneous rupture of membranes) [O42.90] Intrauterine pregnancy: 8916w5d     Secondary diagnosis:  Active Problems:   Placental abruption  Additional problems: transverse lie, fetal distress     Discharge diagnosis: premature rupture of membranes, abruptio placentae                                                                                                Post partum procedures:no  Augmentation: n/a  Complications: Placental Abruption  Hospital course:  Induction of Labor With Cesarean Section  24 y.o. yo G1P0101 at 4816w5d was admitted to the hospital 12/16/2014 for induction of labor. Patient had a labor course significant for premature rupture of membranes prior to presenting [facility] 111/30/16, with bleeding c/w abruption. The patient went for cesarean section due to Non-Reassuring FHR, and delivered a Viable infant,@BABYSUPPRESS (DBLINK,ept,110,,1,,) Membrane Rupture Time/Date: )8:35 AM ,12/16/2014   @Details  of operation can be found in separate operative Note.  Patient had an uncomplicated postpartum course. She is ambulating, tolerating a regular diet, passing flatus, and urinating well.  Patient is discharged home in stable condition on No discharge date for patient encounter..                              Baby expired in the NICU on 12/18/14       Physical exam  Filed Vitals:   12/17/14 0615 12/17/14 1127 12/17/14 1730 12/17/14 2108  BP: 99/57 112/65 112/63 111/57  Pulse:  76 82 96  Temp: 97.7 F (36.5 C) 98 F (36.7 C) 98.7 F (37.1 C) 98.6 F (37 C)  TempSrc: Oral Oral Oral Oral  Resp:  17 18 18   Height:      Weight:      SpO2:  100% 100% 100%   General: alert Lochia: appropriate Uterine Fundus: firm Incision: Healing well with no  significant drainage, Dressing is clean, dry, and intact DVT Evaluation: No evidence of DVT seen on physical exam. Labs: Lab Results  Component Value Date   WBC 24.5* 12/17/2014   HGB 8.0* 12/17/2014   HCT 24.2* 12/17/2014   MCV 96.4 12/17/2014   PLT 194 12/17/2014   CMP Latest Ref Rng 08/05/2014  Glucose 65 - 99 mg/dL 87  BUN 6 - 20 mg/dL 11  Creatinine 4.090.44 - 8.111.00 mg/dL 9.140.61  Sodium 782135 - 956145 mmol/L 138  Potassium 3.5 - 5.1 mmol/L 3.8  Chloride 101 - 111 mmol/L 108  CO2 22 - 32 mmol/L 23  Calcium 8.9 - 10.3 mg/dL 8.9    Discharge instruction: per After Visit Summary and "Baby and Me Booklet".  After visit meds:    Medication List    STOP taking these medications        acetaminophen 325 MG tablet  Commonly known as:  TYLENOL      TAKE these medications  cetirizine 10 MG tablet  Commonly known as:  ZYRTEC  Take 10 mg by mouth daily.     ibuprofen 600 MG tablet  Commonly known as:  ADVIL,MOTRIN  Take 1 tablet (600 mg total) by mouth every 6 (six) hours.     oxyCODONE-acetaminophen 5-325 MG tablet  Commonly known as:  PERCOCET/ROXICET  Take 1-2 tablets by mouth every 4 (four) hours as needed (for pain scale 4-7).     Prenatal Vitamins 28-0.8 MG Tabs  Take 1 tablet by mouth daily.     sertraline 25 MG tablet  Commonly known as:  ZOLOFT  Take 1 tablet (25 mg total) by mouth daily.     zolpidem 5 MG tablet  Commonly known as:  AMBIEN  Take 1 tablet (5 mg total) by mouth at bedtime as needed for sleep.        Diet: routine diet  Activity: Advance as tolerated. Pelvic rest for 6 weeks.   Outpatient follow up:2 weeks Follow up Appt:Future Appointments Date Time Provider Department Center  12/21/2014 2:45 PM Tilda Burrow, MD [facility]   Follow up Visit:No Follow-up on file.  Postpartum contraception: Not Discussed  Newborn Data: Live born female  Birth Weight: 1 lb 14.3 oz (860 g) APGAR: 1, 7 Expired  12/18/14 Disposition:morgue   12/18/2014 ARNOLD,JAMES, MD

## 2014-12-18 NOTE — Plan of Care (Addendum)
Problem: Role Relationship: Goal: Ability to demonstrate positive interaction with newborn will improve Outcome: Progressing Pt expierienced a demise past delivery    Chaplain saw pt prior to d/c   And all materials  Provided to pt

## 2014-12-19 LAB — TYPE AND SCREEN
ABO/RH(D): O NEG
ANTIBODY SCREEN: NEGATIVE
Unit division: 0
Unit division: 0

## 2014-12-21 ENCOUNTER — Ambulatory Visit (INDEPENDENT_AMBULATORY_CARE_PROVIDER_SITE_OTHER): Payer: Medicaid Other | Admitting: Obstetrics and Gynecology

## 2014-12-21 ENCOUNTER — Encounter: Payer: Self-pay | Admitting: Obstetrics and Gynecology

## 2014-12-21 VITALS — BP 120/76 | Ht 66.0 in | Wt 136.0 lb

## 2014-12-21 DIAGNOSIS — Z9889 Other specified postprocedural states: Secondary | ICD-10-CM

## 2014-12-21 DIAGNOSIS — O4592 Premature separation of placenta, unspecified, second trimester: Secondary | ICD-10-CM

## 2014-12-21 DIAGNOSIS — Z09 Encounter for follow-up examination after completed treatment for conditions other than malignant neoplasm: Secondary | ICD-10-CM

## 2014-12-21 NOTE — Progress Notes (Signed)
Patient ID: Riley KillHope Villarreal, female   DOB: [birth year], 24 y.o.   MRN: [id number] Pt here today for post op visit. Pt denies any redness, warmth or odor to incision site. Pt states that the pain medication is easing the pain. Pt denies any problems at this time.

## 2014-12-21 NOTE — Progress Notes (Addendum)
Patient ID: Kelly Villarreal, female   DOB: 1990/12/23, 24 y.o.   MRN: [id number] Subjective:  Kelly Villarreal is a 24 y.o. female now 5 days status post cesarean section.  Pt reports moderate vaginal bleeding and abd pain. Pt reports she is able to manage the pain with pain medication. Pt denies any redness, odor or warmth to the incision site. Pt further denies any fevers or any other Sx at this time.  Review of Systems Negative except moderate vaginal bleeding and abd pain.    Diet:   None   Bowel movements : normal.  Pain is controlled with current analgesics. Medications being used: ibuprofen (OTC).  Objective:  BP 120/76 mmHg  Ht 5\' 6"  (1.676 m)  Wt 136 lb (61.689 kg)  BMI 21.96 kg/m2  Breastfeeding? No General:Well developed, well nourished.  No acute distress. Abdomen: Bowel sounds normal, soft, non-tender. Pelvic Exam: deferred  Incision(s):   Healing well, no drainage, no erythema, no hernia, no swelling, no dehiscence, Assessment:  Post-Op 5 days s/p cesarean section  Emergency for Abruption/ fetal distress at 25 wk.  Doing well postoperatively.   Plan:  1.Wound care discussed including removing dressing in 7 days and remove terry strips in 10 days.  2. . current medications. No changes 3. Activity restrictions: no bending, stooping, or squatting, no driving, no gym class, no lifting more than 10 lbs pounds, no overhead lifting, no repetitive motion, no sports. Additionally, post surgical instructions provided, pt advised to follow post surgical instructions.  4. return to work: not applicable. 5. Follow up in 2 weeks. 6. [employer] support group.  By signing my name below, I, Marica Otterusrat Rahman, attest that this documentation has been prepared under the direction and in the presence of Christin BachJohn Masey Scheiber, MD. Electronically Signed: Marica OtterNusrat Rahman, ED Scribe. 12/21/2014. 3:42 PM.   I personally performed the services described in this documentation, which was SCRIBED in my  presence. The recorded information has been reviewed and considered accurate. It has been edited as necessary during review. Tilda BurrowFERGUSON,Darly Fails V, MD

## 2015-01-04 ENCOUNTER — Encounter: Payer: Self-pay | Admitting: Obstetrics and Gynecology

## 2015-01-04 ENCOUNTER — Ambulatory Visit (INDEPENDENT_AMBULATORY_CARE_PROVIDER_SITE_OTHER): Payer: Medicaid Other | Admitting: Obstetrics and Gynecology

## 2015-01-04 NOTE — Progress Notes (Signed)
Patient ID: Kelly Villarreal, female   DOB: [birth year], 24 y.o.   MRN: [id number] Pt here today for post partum visit and to discuss BC.

## 2015-01-04 NOTE — Progress Notes (Signed)
Patient ID: Kelly Villarreal, female   DOB: 26-Dec-1990, 24 y.o.   MRN: [id number]  Subjective:  Kelly Villarreal is a 24 y.o. female now 3 weeks status post cesarean section at 5935w5d.  Done for PPROM and abruption   Pt complains of lower abdominal pain at the site of a surgical incision, worsened with movement. She reports feeling depressed but is coping well with the loss of her baby. Pt notes she removed the steri strips from her incision 8 days ago. Pt has not been sexually active s/p cesarean.   Review of Systems Negative except lower abdominal pain   Diet:   normal   Bowel movements : normal.  Pain is controlled with current analgesics. Medications being used: ibuprofen (OTC) and narcotic analgesics including oxycodone (Oxycontin, Oxyir).  Objective:  BP 118/78 mmHg  Ht 5\' 6"  (1.676 m)  Wt 128 lb (58.06 kg)  BMI 20.67 kg/m2  Breastfeeding? No General:Well developed, well nourished.  No acute distress. Abdomen: Bowel sounds normal, soft, non-tender.  Incision(s):   Healing well, no drainage, no erythema, no hernia, no swelling, no dehiscence,   Assessment:  Post-Op 3 weeks s/p cesarean section   Doing well postoperatively.   Plan:  1.Wound care discussed  2. . current medications. Ibuprofen and Oxycodone 3. Activity restrictions: no bending, stooping, or squatting and no lifting more than 10 pounds 4. return to work: not applicable. 5. Follow up in 1 week. 6. Schedule for Nexplanon placement next week    By signing my name below, I, Doreatha MartinEva Mathews, attest that this documentation has been prepared under the direction and in the presence of Tilda BurrowJohn Jahkari Maclin V, MD. Electronically Signed: Doreatha MartinEva Mathews, ED Scribe. 01/04/2015. 2:04 PM.  I personally performed the services described in this documentation, which was SCRIBED in my presence. The recorded information has been reviewed and considered accurate. It has been edited as necessary during review. Tilda BurrowFERGUSON,Jashayla Glatfelter V, MD

## 2015-01-06 ENCOUNTER — Other Ambulatory Visit: Payer: Medicaid Other

## 2015-01-06 ENCOUNTER — Encounter: Payer: Medicaid Other | Admitting: Advanced Practice Midwife

## 2015-01-15 ENCOUNTER — Ambulatory Visit (INDEPENDENT_AMBULATORY_CARE_PROVIDER_SITE_OTHER): Payer: Medicaid Other | Admitting: Obstetrics and Gynecology

## 2015-01-15 ENCOUNTER — Encounter: Payer: Self-pay | Admitting: Obstetrics and Gynecology

## 2015-01-15 VITALS — BP 118/60 | HR 82 | Ht 66.0 in | Wt 128.4 lb

## 2015-01-15 DIAGNOSIS — Z3202 Encounter for pregnancy test, result negative: Secondary | ICD-10-CM

## 2015-01-15 DIAGNOSIS — Z309 Encounter for contraceptive management, unspecified: Secondary | ICD-10-CM | POA: Insufficient documentation

## 2015-01-15 DIAGNOSIS — Z30018 Encounter for initial prescription of other contraceptives: Secondary | ICD-10-CM

## 2015-01-15 DIAGNOSIS — Z30017 Encounter for initial prescription of implantable subdermal contraceptive: Secondary | ICD-10-CM | POA: Diagnosis not present

## 2015-01-15 DIAGNOSIS — Z30013 Encounter for initial prescription of injectable contraceptive: Secondary | ICD-10-CM

## 2015-01-15 LAB — POCT URINE PREGNANCY: PREG TEST UR: NEGATIVE

## 2015-01-15 NOTE — Patient Instructions (Signed)
Etonogestrel implant What is this medicine? ETONOGESTREL (et oh noe JES trel) is a contraceptive (birth control) device. It is used to prevent pregnancy. It can be used for up to 3 years. This medicine may be used for other purposes; ask your health care provider or pharmacist if you have questions. What should I tell my health care provider before I take this medicine? They need to know if you have any of these conditions: -abnormal vaginal bleeding -blood vessel disease or blood clots -cancer of the breast, cervix, or liver -depression -diabetes -gallbladder disease -headaches -heart disease or recent heart attack -high blood pressure -high cholesterol -kidney disease -liver disease -renal disease -seizures -tobacco smoker -an unusual or allergic reaction to etonogestrel, other hormones, anesthetics or antiseptics, medicines, foods, dyes, or preservatives -pregnant or trying to get pregnant -breast-feeding How should I use this medicine? This device is inserted just under the skin on the inner side of your upper arm by a health care professional. Talk to your pediatrician regarding the use of this medicine in children. Special care may be needed. Overdosage: If you think you have taken too much of this medicine contact a poison control center or emergency room at once. NOTE: This medicine is only for you. Do not share this medicine with others. What if I miss a dose? This does not apply. What may interact with this medicine? Do not take this medicine with any of the following medications: -amprenavir -bosentan -fosamprenavir This medicine may also interact with the following medications: -barbiturate medicines for inducing sleep or treating seizures -certain medicines for fungal infections like ketoconazole and itraconazole -griseofulvin -medicines to treat seizures like carbamazepine, felbamate, oxcarbazepine, phenytoin,  topiramate -modafinil -phenylbutazone -rifampin -some medicines to treat HIV infection like atazanavir, indinavir, lopinavir, nelfinavir, tipranavir, ritonavir -St. Janari Gagner's wort This list may not describe all possible interactions. Give your health care provider a list of all the medicines, herbs, non-prescription drugs, or dietary supplements you use. Also tell them if you smoke, drink alcohol, or use illegal drugs. Some items may interact with your medicine. What should I watch for while using this medicine? This product does not protect you against HIV infection (AIDS) or other sexually transmitted diseases. You should be able to feel the implant by pressing your fingertips over the skin where it was inserted. Contact your doctor if you cannot feel the implant, and use a non-hormonal birth control method (such as condoms) until your doctor confirms that the implant is in place. If you feel that the implant may have broken or become bent while in your arm, contact your healthcare provider. What side effects may I notice from receiving this medicine? Side effects that you should report to your doctor or health care professional as soon as possible: -allergic reactions like skin rash, itching or hives, swelling of the face, lips, or tongue -breast lumps -changes in emotions or moods -depressed mood -heavy or prolonged menstrual bleeding -pain, irritation, swelling, or bruising at the insertion site -scar at site of insertion -signs of infection at the insertion site such as fever, and skin redness, pain or discharge -signs of pregnancy -signs and symptoms of a blood clot such as breathing problems; changes in vision; chest pain; severe, sudden headache; pain, swelling, warmth in the leg; trouble speaking; sudden numbness or weakness of the face, arm or leg -signs and symptoms of liver injury like dark yellow or brown urine; general ill feeling or flu-like symptoms; light-colored stools; loss of  appetite; nausea; right upper belly   pain; unusually weak or tired; yellowing of the eyes or skin -unusual vaginal bleeding, discharge -signs and symptoms of a stroke like changes in vision; confusion; trouble speaking or understanding; severe headaches; sudden numbness or weakness of the face, arm or leg; trouble walking; dizziness; loss of balance or coordination Side effects that usually do not require medical attention (Report these to your doctor or health care professional if they continue or are bothersome.): -acne -back pain -breast pain -changes in weight -dizziness -general ill feeling or flu-like symptoms -headache -irregular menstrual bleeding -nausea -sore throat -vaginal irritation or inflammation This list may not describe all possible side effects. Call your doctor for medical advice about side effects. You may report side effects to FDA at 1-800-FDA-1088. Where should I keep my medicine? This drug is given in a hospital or clinic and will not be stored at home. NOTE: This sheet is a summary. It may not cover all possible information. If you have questions about this medicine, talk to your doctor, pharmacist, or health care provider.    2016, Elsevier/Gold Standard. (2013-10-17 14:07:06)  

## 2015-01-15 NOTE — Progress Notes (Signed)
Patient ID: Riley KillHope Villarreal, female   DOB: 06/11/1990, 24 y.o.   MRN: [id number]  Patient given informed consent, signed copy in the chart, time out was performed. Pregnancy test was negative.   Pt reports she has not been using oral contraceptives since her c-section 4 weeks ago. She does note that she started to bleed 2 weeks after her c-section and now has some light spotting. She reports that she has not been sexually active since her procedure.   Appropriate time out taken.  Patient's left arm was prepped and draped in the usual sterile fashion. The ruler used to measure and mark insertion area.  Pt was prepped with alcohol swab and then injected with 3 cc of 2 % lidocaine.  Pt was prepped with betadine, Nexplanon removed form packaging. Then inserted per standard guidelines. Patient and provider were able to palpate rod under skin. Pt insertion site covered with sterile dressing.   Minimal blood loss.  Pt tolerated the procedure well.    F/u in 4 weeks for recheck    By signing my name below, I, Doreatha MartinEva Mathews, attest that this documentation has been prepared under the direction and in the presence of Tilda BurrowJohn Celvin Taney V, MD. Electronically Signed: Doreatha MartinEva Mathews, ED Scribe. 01/15/2015. 12:03 PM.  I personally performed the services described in this documentation, which was SCRIBED in my presence. The recorded information has been reviewed and considered accurate. It has been edited as necessary during review. Tilda BurrowFERGUSON,Devika Dragovich V, MD

## 2015-01-19 ENCOUNTER — Telehealth: Payer: Self-pay | Admitting: Obstetrics and Gynecology

## 2015-01-19 NOTE — Telephone Encounter (Signed)
Pt states that she had a Nexplanon inserted last Friday. Pt states that started bleeding very heavy the next day. Pt states that she is having to change pads every 4 hours. Pt wanted to know if she should wait it out or if she needed to be seen.   I spoke with Dr. Emelda FearFerguson and he advised that the pt should wait it out and for her to call our office Thursday if things are no better. Pt was advised of this and verbalized understanding.

## 2015-02-12 ENCOUNTER — Ambulatory Visit (INDEPENDENT_AMBULATORY_CARE_PROVIDER_SITE_OTHER): Payer: Medicaid Other | Admitting: Obstetrics and Gynecology

## 2015-02-12 ENCOUNTER — Encounter: Payer: Self-pay | Admitting: Obstetrics and Gynecology

## 2015-02-12 VITALS — BP 100/60 | Ht 66.0 in | Wt 131.0 lb

## 2015-02-12 DIAGNOSIS — Z3049 Encounter for surveillance of other contraceptives: Secondary | ICD-10-CM

## 2015-02-12 NOTE — Progress Notes (Signed)
Patient ID: Kelly Villarreal, female   DOB: [birth year], 25 y.o.   MRN: [id number]   [facility] Visit  Patient name: Kelly Villarreal MRN [id number]  Date of birth: [birth year]  CC & HPI:  Kelly Villarreal is a 25 y.o. female presenting today for f/u on nexplanon placement. Pt reports increase in appetites since nexplanon placement. Pt denies any other Sx or concerns at this time.   ROS:  10 Systems reviewed and all are negative for acute change except as noted in the HPI. Pertinent History Reviewed:   Reviewed: Significant for WT GAIN X 2 POUNDS :) Medical         Past Medical History  Diagnosis Date  . Allergy     seasonal  . Anxiety 03/08/2011  . Depression 03/08/2011  . Threatened miscarriage in early pregnancy 08/10/2014  . Nausea and vomiting during pregnancy 08/10/2014  . Vaginal bleeding in pregnancy 08/10/2014                              Surgical Hx:    Past Surgical History  Procedure Laterality Date  . Mouth surgery    . Cesarean section N/A 12/16/2014    Procedure: CESAREAN SECTION;  Surgeon: Adam Phenix, MD;  Location: [facility];  Service: Obstetrics;  Laterality: N/A;   Medications: Reviewed & Updated - see associated section                       Current outpatient prescriptions:  .  Prenatal Vit-Fe Fumarate-FA (PRENATAL VITAMINS) 28-0.8 MG TABS, Take 1 tablet by mouth daily., Disp: 30 tablet, Rfl: 3 .  zolpidem (AMBIEN) 5 MG tablet, Take 1 tablet (5 mg total) by mouth at bedtime as needed for sleep. (Patient not taking: Reported on 02/12/2015), Disp: 30 tablet, Rfl: 0   Social History: Reviewed -  reports that she has never smoked. She has never used smokeless tobacco.  Objective Findings:  Vitals: Blood pressure 100/60, height  (1.676 m), weight 131 lb (59.421 kg), last menstrual period 01/31/2015, not currently breastfeeding.  Physical Examination: General appearance - alert, well appearing, and in no distress, oriented to person, place, and time and normal appearing  weight Mental status - alert, oriented to person, place, and time, normal mood, behavior, speech, dress, motor activity, and thought processes Abdomen - soft, nontender, nondistended, no masses or organomegaly Extremities - peripheral pulses normal, no pedal edema, no clubbing or cyanosis, LEFT ARM normal healing Skin - normal coloration and turgor, no rashes, no suspicious skin lesions noted   Assessment & Plan:   A:  1. Normal Nexplanon check  P:  1. Fu q yr  nexplanon removal 3 yr     By signing my name below, I, Marica Otter, attest that this documentation has been prepared under the direction and in the presence of Christin Bach, MD. Electronically Signed: Marica Otter, ED Scribe. 02/12/2015. 12:04 PM.   I personally performed the services described in this documentation, which was SCRIBED in my presence. The recorded information has been reviewed and considered accurate. It has been edited as necessary during review. Tilda Burrow, MD

## 2015-02-12 NOTE — Progress Notes (Signed)
Patient ID: Kelly Villarreal, female   DOB: [birth year], 25 y.o.   MRN: [id number] Pt here today for follow up. Pt denies any problems or concerns at this time.

## 2015-04-07 ENCOUNTER — Ambulatory Visit (INDEPENDENT_AMBULATORY_CARE_PROVIDER_SITE_OTHER): Payer: BLUE CROSS/BLUE SHIELD | Admitting: Family Medicine

## 2015-04-07 ENCOUNTER — Encounter: Payer: Self-pay | Admitting: Family Medicine

## 2015-04-07 VITALS — BP 118/64 | HR 70 | Temp 98.4°F | Resp 14 | Ht 66.0 in | Wt 138.0 lb

## 2015-04-07 DIAGNOSIS — G4700 Insomnia, unspecified: Secondary | ICD-10-CM

## 2015-04-07 DIAGNOSIS — F4320 Adjustment disorder, unspecified: Secondary | ICD-10-CM

## 2015-04-07 DIAGNOSIS — Z634 Disappearance and death of family member: Secondary | ICD-10-CM

## 2015-04-07 DIAGNOSIS — F4321 Adjustment disorder with depressed mood: Secondary | ICD-10-CM

## 2015-04-07 DIAGNOSIS — J302 Other seasonal allergic rhinitis: Secondary | ICD-10-CM

## 2015-04-07 MED ORDER — TRAZODONE HCL 50 MG PO TABS: 50.0000 mg | ORAL_TABLET | Freq: Every day | ORAL | Status: DC

## 2015-04-07 NOTE — Patient Instructions (Addendum)
Take 1 hour before bedtime Take 1/2 tablet if you feel like it is too strong  Referral to therapy  F/U 6 weeks

## 2015-04-07 NOTE — Assessment & Plan Note (Signed)
i recommend OTC zyrtec or claritin

## 2015-04-07 NOTE — Progress Notes (Signed)
Patient ID: Kelly Villarreal, female   DOB: 1990/06/08, 25 y.o.   MRN: [id number]   Subjective:    Patient ID: Kelly Villarreal, female    DOB: 1990/06/08, 25 y.o.   MRN: [id number]  Patient presents for Anxiety Patient here to discuss depression and anxiety. She's been having problems with grief as well as depression anxiety insomnia since she delivered her premature female infant at [redacted]w[redacted]D due to PPROM and Abruption Nov 30. Her son died after delivery. She states that she when she went home she was prescribed Zoloft 25 mg and Ambien 5 mg she took these until January. She did not see any difference with the Zoloft. Ambien did help her sleep some but she would continue to wake up. She states that she does distress in a different way from her husband and she finds herself being more tearful. She is alone because he works a lot she does go to school but when she is at home she finds himself very tearful and depressed. Her sleep is also worse in the past few weeks she is waking up every night she does not know why. She states that her mind is racing and this makes her fatigued during the daytime. She would like to try some medication to help with her symptoms.  She has taken some Benadryl because of some allergy symptoms as well but this did not help with her sleep.  Review Of Systems:  GEN- denies fatigue, fever, weight loss,weakness, recent illness HEENT- denies eye drainage, change in vision, +nasal discharge, CVS- denies chest pain, palpitations RESP- denies SOB, cough, wheeze ABD- denies N/V, change in stools, abd pain GU- denies dysuria, hematuria, dribbling, incontinence MSK- denies joint pain, muscle aches, injury Neuro- denies headache, dizziness, syncope, seizure activity       Objective:    BP 118/64 mmHg  Pulse 70  Temp(Src) 98.4 F (36.9 C) (Oral)  Resp 14  Ht 5\' 6"  (1.676 m)  Wt 138 lb (62.596 kg)  BMI 22.28 kg/m2 GEN- NAD, alert and oriented x3 HEENT- PERRL, EOMI, non injected  sclera, pink conjunctiva, MMM, oropharynx clear, nares mild congestion,  Neck- Supple, no LAD  CVS- RRR, no murmur RESP-CTAB Psych- depressed affect, not anxious, no SI, well groomed, good eye contact, no SI        Assessment & Plan:      Problem List Items Addressed This Visit    None    Visit Diagnoses    Grief reaction    -  Primary    Very difficult situation with loss of child. I recommend grief counseling, she will also discuss with husband to see if he wants to be present. Start trazodone for sleep and depression.  RTC 6 weeks    Insomnia           Note: This dictation was prepared with Dragon dictation along with smaller phrase technology. Any transcriptional errors that result from this process are unintentional.

## 2015-04-09 ENCOUNTER — Telehealth: Payer: Self-pay | Admitting: *Deleted

## 2015-04-09 NOTE — Telephone Encounter (Signed)
pt has apt scheduled for Wednesday Mar 29 at 1pm [facility] [place] lmtrc to pt for appt information

## 2015-04-13 NOTE — Telephone Encounter (Signed)
LMTRC

## 2015-04-13 NOTE — Telephone Encounter (Signed)
Pt aware of appt.

## 2015-05-19 ENCOUNTER — Ambulatory Visit: Payer: BLUE CROSS/BLUE SHIELD | Admitting: Family Medicine

## 2015-05-21 ENCOUNTER — Ambulatory Visit: Payer: BLUE CROSS/BLUE SHIELD | Admitting: Family Medicine

## 2015-05-26 ENCOUNTER — Telehealth: Payer: Self-pay | Admitting: Family Medicine

## 2015-05-26 NOTE — Telephone Encounter (Signed)
Please schedule appt, also have her bring in the information that this letter needs to go to

## 2015-05-26 NOTE — Telephone Encounter (Signed)
Pt's school is requesting a letter from Dr. Jeanice Limurham stating that the trauma that she experienced would have prevented her from completing her school work and/or missing days of school this past march.  If you have any questions please call [phone]

## 2015-05-26 NOTE — Telephone Encounter (Signed)
MD please advise

## 2015-05-27 NOTE — Telephone Encounter (Signed)
Call placed to patient. LMTRC.  

## 2015-05-27 NOTE — Telephone Encounter (Signed)
Appointment made and pt aware to bring in information for letter

## 2015-05-31 ENCOUNTER — Ambulatory Visit (INDEPENDENT_AMBULATORY_CARE_PROVIDER_SITE_OTHER): Payer: BLUE CROSS/BLUE SHIELD | Admitting: Family Medicine

## 2015-05-31 ENCOUNTER — Encounter: Payer: Self-pay | Admitting: Family Medicine

## 2015-05-31 VITALS — BP 120/64 | HR 68 | Temp 98.7°F | Resp 12 | Ht 66.0 in | Wt 142.0 lb

## 2015-05-31 DIAGNOSIS — M545 Low back pain, unspecified: Secondary | ICD-10-CM

## 2015-05-31 DIAGNOSIS — F4321 Adjustment disorder with depressed mood: Secondary | ICD-10-CM

## 2015-05-31 DIAGNOSIS — G4700 Insomnia, unspecified: Secondary | ICD-10-CM | POA: Diagnosis not present

## 2015-05-31 DIAGNOSIS — F4320 Adjustment disorder, unspecified: Secondary | ICD-10-CM

## 2015-05-31 HISTORY — DX: Adjustment disorder with depressed mood: F43.21

## 2015-05-31 HISTORY — DX: Adjustment disorder, unspecified: F43.20

## 2015-05-31 NOTE — Patient Instructions (Signed)
Continue current medication  F/U 2 months

## 2015-05-31 NOTE — Progress Notes (Signed)
Patient ID: Kelly Villarreal, female   DOB: 1990-11-16, 25 y.o.   MRN: [id number]    Subjective:    Patient ID: Kelly Villarreal, female    DOB: 1990-11-16, 25 y.o.   MRN: [id number]  Patient presents for F/U Patient here to follow-up medications. She initially took the trazodone and then stop for a few weeks. She recently restarted this and has helped her sleep. It is also helping with her mood. She now has an appointment to see a psychotherapist with regards to her grief and losing her child last year. She has an appointment in 2 weeks. She gets some support from her husband but not so much from the extended family.  She's had some low back pain on and off since her pregnancy. She denies any radiating symptoms or change in bowel or bladder. No tingling or numbness in her lower extremities.    Review Of Systems:  GEN- denies fatigue, fever, weight loss,weakness, recent illness HEENT- denies eye drainage, change in vision, nasal discharge, CVS- denies chest pain, palpitations RESP- denies SOB, cough, wheeze ABD- denies N/V, change in stools, abd pain GU- denies dysuria, hematuria, dribbling, incontinence MSK- denies joint pain, muscle aches, injury Neuro- denies headache, dizziness, syncope, seizure activity       Objective:    BP 120/64 mmHg  Pulse 68  Temp(Src) 98.7 F (37.1 C) (Oral)  Resp 12  Ht 5\' 6"  (1.676 m)  Wt 142 lb (64.411 kg)  BMI 22.93 kg/m2  LMP 05/17/2015 (Approximate) GEN- NAD, alert and oriented x3 MSK- Spine NT, no spasm, FROM, neg SLR, FROM Hips, knees Neuro- non antalgic gait, strength equal bilat, sensation in tact Psych- normal affect and mood  EXT- No edema Pulses- Radial, DP- 2+        Assessment & Plan:      Problem List Items Addressed This Visit    Insomnia   Grief reaction    CONTINUE trazodone to help with sleep and mood F/U with therapist which will be very beneficial to her at this time        Other Visit Diagnoses    Midline low back  pain without sciatica    -  Primary    MSK pain, advised exercise, stretching, OTC nsaids       Note: This dictation was prepared with Dragon dictation along with smaller phrase technology. Any transcriptional errors that result from this process are unintentional.

## 2015-05-31 NOTE — Assessment & Plan Note (Signed)
CONTINUE trazodone to help with sleep and mood F/U with therapist which will be very beneficial to her at this time

## 2015-06-04 ENCOUNTER — Other Ambulatory Visit: Payer: BLUE CROSS/BLUE SHIELD | Admitting: Obstetrics and Gynecology

## 2015-06-10 ENCOUNTER — Other Ambulatory Visit: Payer: BLUE CROSS/BLUE SHIELD | Admitting: Obstetrics and Gynecology

## 2015-07-12 ENCOUNTER — Ambulatory Visit (INDEPENDENT_AMBULATORY_CARE_PROVIDER_SITE_OTHER): Payer: BLUE CROSS/BLUE SHIELD | Admitting: Obstetrics and Gynecology

## 2015-07-12 ENCOUNTER — Encounter: Payer: Self-pay | Admitting: Obstetrics and Gynecology

## 2015-07-12 VITALS — BP 132/84 | HR 76 | Ht 66.0 in | Wt 150.5 lb

## 2015-07-12 DIAGNOSIS — N938 Other specified abnormal uterine and vaginal bleeding: Secondary | ICD-10-CM

## 2015-07-12 DIAGNOSIS — Z975 Presence of (intrauterine) contraceptive device: Secondary | ICD-10-CM

## 2015-07-12 DIAGNOSIS — Z3046 Encounter for surveillance of implantable subdermal contraceptive: Secondary | ICD-10-CM

## 2015-07-12 MED ORDER — MEGESTROL ACETATE 40 MG PO TABS: 40.0000 mg | ORAL_TABLET | Freq: Three times a day (TID) | ORAL | Status: DC

## 2015-07-12 NOTE — Patient Instructions (Signed)
Use the megace pills three times daily when spotting or bleeding, and may cut down to one pill daily for a couple of days after spotting stops. Then stop the megace. Return in 6 wk for followup

## 2015-07-12 NOTE — Progress Notes (Signed)
Patient ID: Kelly Villarreal, female   DOB: 1990/06/16, 25 y.o.   MRN: [id number]  BP 132/84 mmHg  Pulse 76  Ht 5\' 6"  (1.676 m)  Wt 150 lb 8 oz (68.266 kg)  BMI 24.30 kg/m2  Breastfeeding? No   GYNECOLOGY CLINIC PROCEDURE NOTE  Kelly Villarreal is a 25 y.o. G1P0101 here for Nexplanon removal. Pt reports she has had 7-10 days heavy vaginal bleeding multiple times per month since Nexplanon insertion 6 months ago. She reports her first period began a few weeks after Nexplanon insertion and the bleeding has not decreased. Pt has not previously been prescribed Megace for this issue.      Nexplanon Removal Patient identified, informed consent performed, consent signed.   Appropriate time out taken. Nexplanon site identified.  Area prepped in usual sterile fashon. Nexplanon was not removed after discussion of options to control BTB prior to removing Nexplanon.     Assessment 1. BTB on Nexplanon   Plan  1. Rx 45 pills tid 40 mg Megace until bleeding stops  2. F/u in 6 weeks for recheck    By signing my name below, I, Doreatha MartinEva Mathews, attest that this documentation has been prepared under the direction and in the presence of Tilda BurrowJohn V Shadman Tozzi, MD. Electronically Signed: Doreatha MartinEva Mathews, ED Scribe. 07/12/2015. 4:38 PM.  I personally performed the services described in this documentation, which was SCRIBED in my presence. The recorded information has been reviewed and considered accurate. It has been edited as necessary during review. Tilda BurrowFERGUSON,Jakwan Sally V, MD

## 2015-07-21 DIAGNOSIS — Z3046 Encounter for surveillance of implantable subdermal contraceptive: Secondary | ICD-10-CM | POA: Insufficient documentation

## 2015-08-23 ENCOUNTER — Ambulatory Visit: Payer: BLUE CROSS/BLUE SHIELD | Admitting: Obstetrics and Gynecology

## 2015-08-30 ENCOUNTER — Ambulatory Visit: Payer: BLUE CROSS/BLUE SHIELD | Admitting: Obstetrics and Gynecology

## 2015-09-09 ENCOUNTER — Ambulatory Visit: Payer: BLUE CROSS/BLUE SHIELD | Admitting: Obstetrics and Gynecology

## 2015-09-16 ENCOUNTER — Ambulatory Visit (INDEPENDENT_AMBULATORY_CARE_PROVIDER_SITE_OTHER): Payer: BLUE CROSS/BLUE SHIELD | Admitting: Obstetrics and Gynecology

## 2015-09-16 ENCOUNTER — Encounter: Payer: Self-pay | Admitting: Obstetrics and Gynecology

## 2015-09-16 ENCOUNTER — Other Ambulatory Visit (HOSPITAL_COMMUNITY)
Admission: RE | Admit: 2015-09-16 | Discharge: 2015-09-16 | Disposition: A | Discharge status: Home / Self Care | Payer: BLUE CROSS/BLUE SHIELD | Source: Ambulatory Visit | Attending: Obstetrics and Gynecology | Admitting: Obstetrics and Gynecology

## 2015-09-16 ENCOUNTER — Ambulatory Visit: Payer: BLUE CROSS/BLUE SHIELD | Admitting: Obstetrics and Gynecology

## 2015-09-16 VITALS — BP 120/70 | HR 70 | Ht 66.0 in | Wt 147.8 lb

## 2015-09-16 DIAGNOSIS — R8290 Unspecified abnormal findings in urine: Secondary | ICD-10-CM | POA: Diagnosis not present

## 2015-09-16 DIAGNOSIS — Z01419 Encounter for gynecological examination (general) (routine) without abnormal findings: Secondary | ICD-10-CM | POA: Diagnosis not present

## 2015-09-16 DIAGNOSIS — Z113 Encounter for screening for infections with a predominantly sexual mode of transmission: Secondary | ICD-10-CM | POA: Insufficient documentation

## 2015-09-16 LAB — POCT URINALYSIS DIPSTICK
Glucose, UA: NEGATIVE
Ketones, UA: NEGATIVE
Leukocytes, UA: NEGATIVE
NITRITE UA: NEGATIVE
Protein, UA: NEGATIVE
RBC UA: NEGATIVE

## 2015-09-16 NOTE — Progress Notes (Signed)
  Assessment:  Annual Gyn Exam   Plan:  1. pap smear done, next pap due in 3 years 2. return annually or prn 3    Annual mammogram advised Subjective:  Kelly Villarreal is a 25 y.o. female G1P0101 who presents for annual exam. Patient's last menstrual period was 09/06/2015.  The patient has no acute complaints at this time.   The following portions of the patient's history were reviewed and updated as appropriate: allergies, current medications, past family history, past medical history, past social history, past surgical history and problem list.  Past Medical History:  Diagnosis Date  . Allergy    seasonal  . Anxiety 03/08/2011  . Depression 03/08/2011  . Nausea and vomiting during pregnancy 08/10/2014  . Threatened miscarriage in early pregnancy 08/10/2014  . Vaginal bleeding in pregnancy 08/10/2014    Past Surgical History:  Procedure Laterality Date  . CESAREAN SECTION N/A 12/16/2014   Procedure: CESAREAN SECTION;  Surgeon: Adam PhenixJames G Arnold, MD;  Location: [facility];  Service: Obstetrics;  Laterality: N/A;  . MOUTH SURGERY       Current Outpatient Prescriptions:  .  Doxylamine-Pyridoxine (DICLEGIS) 10-10 MG TBEC, Take by mouth as needed., Disp: , Rfl:  .  etonogestrel (NEXPLANON) 68 MG IMPL implant, 1 each by Subdermal route once., Disp: , Rfl:  .  megestrol (MEGACE) 40 MG tablet, Take 1 tablet (40 mg total) by mouth 3 (three) times daily., Disp: 45 tablet, Rfl: 2 .  Prenatal Vit-Fe Fumarate-FA (PRENATAL VITAMIN PO), Take by mouth daily., Disp: , Rfl:  .  traZODone (DESYREL) 50 MG tablet, Take 1 tablet (50 mg total) by mouth at bedtime. (Patient not taking: Reported on 09/16/2015), Disp: 30 tablet, Rfl: 3  Review of Systems Constitutional: negative Gastrointestinal: negative Genitourinary: negative  Objective:  BP 120/70   Pulse 70   Ht 5\' 6"  (1.676 m)   Wt 147 lb 12.8 oz (67 kg)   LMP 09/06/2015   BMI 23.86 kg/m    BMI: Body mass index is 23.86 kg/m.  General Appearance:  Alert, appropriate appearance for age. No acute distress HEENT: Grossly normal Neck / Thyroid:  Cardiovascular: RRR; normal S1, S2, no murmur Lungs: CTA bilaterally Back: No CVAT Breast Exam: No dimpling, nipple retraction or discharge. No masses or nodes., Normal to inspection, Normal breast tissue bilaterally and No masses or nodes.No dimpling, nipple retraction or discharge. Gastrointestinal: Soft, non-tender, no masses or organomegaly Pelvic Exam: External genitalia: normal general appearance Vaginal: normal without tenderness, induration or masses Cervix: normal appearance Adnexa: normal bimanual exam Uterus: normal single, nontender  PAP: Pap smear done today. Rectovaginal: not indicated Lymphatic Exam: Non-palpable nodes in neck, clavicular, axillary, or inguinal regions  Skin: no rash or abnormalities Neurologic: Normal gait and speech, no tremor  Psychiatric: Alert and oriented, appropriate affect.  Urinalysis:normal  Christin BachJohn Jahel Wavra. MD Pgr [phone] 11:50 AM   By signing my name below, I, Freida Busmaniana Omoyeni, attest that this documentation has been prepared under the direction and in the presence of Tilda BurrowJohn V Arlon Bleier, MD . Electronically Signed: Freida Busmaniana Omoyeni, Scribe. 09/16/2015. 12:04 PM. I personally performed the services described in this documentation, which was SCRIBED in my presence. The recorded information has been reviewed and considered accurate. It has been edited as necessary during review. Tilda BurrowFERGUSON,Don Tiu V, MD

## 2015-09-17 LAB — CYTOLOGY - PAP

## 2015-09-23 ENCOUNTER — Encounter (HOSPITAL_COMMUNITY): Payer: Self-pay | Admitting: Emergency Medicine

## 2015-09-23 ENCOUNTER — Encounter (HOSPITAL_COMMUNITY): Payer: Self-pay

## 2015-09-23 ENCOUNTER — Emergency Department (HOSPITAL_COMMUNITY)
Admission: EM | Admit: 2015-09-23 | Discharge: 2015-09-24 | Disposition: A | Discharge status: Home / Self Care | Payer: BLUE CROSS/BLUE SHIELD | Attending: Emergency Medicine | Admitting: Emergency Medicine

## 2015-09-23 ENCOUNTER — Emergency Department (HOSPITAL_COMMUNITY)
Admission: EM | Admit: 2015-09-23 | Discharge: 2015-09-23 | Disposition: A | Discharge status: Home / Self Care | Payer: BLUE CROSS/BLUE SHIELD | Source: Home / Self Care

## 2015-09-23 DIAGNOSIS — R109 Unspecified abdominal pain: Secondary | ICD-10-CM

## 2015-09-23 DIAGNOSIS — M545 Low back pain: Secondary | ICD-10-CM | POA: Insufficient documentation

## 2015-09-23 DIAGNOSIS — Z79899 Other long term (current) drug therapy: Secondary | ICD-10-CM | POA: Diagnosis not present

## 2015-09-23 DIAGNOSIS — R509 Fever, unspecified: Secondary | ICD-10-CM | POA: Insufficient documentation

## 2015-09-23 DIAGNOSIS — Z5321 Procedure and treatment not carried out due to patient leaving prior to being seen by health care provider: Secondary | ICD-10-CM

## 2015-09-23 DIAGNOSIS — Z791 Long term (current) use of non-steroidal anti-inflammatories (NSAID): Secondary | ICD-10-CM | POA: Insufficient documentation

## 2015-09-23 DIAGNOSIS — N12 Tubulo-interstitial nephritis, not specified as acute or chronic: Secondary | ICD-10-CM

## 2015-09-23 LAB — URINALYSIS, ROUTINE W REFLEX MICROSCOPIC
BILIRUBIN URINE: NEGATIVE
Glucose, UA: NEGATIVE mg/dL
Leukocytes, UA: NEGATIVE
NITRITE: POSITIVE — AB
PROTEIN: 30 mg/dL — AB
SPECIFIC GRAVITY, URINE: 1.015 (ref 1.005–1.030)
pH: 6 (ref 5.0–8.0)

## 2015-09-23 LAB — BASIC METABOLIC PANEL
ANION GAP: 11 (ref 5–15)
BUN: 7 mg/dL (ref 6–20)
CALCIUM: 8.8 mg/dL — AB (ref 8.9–10.3)
CO2: 21 mmol/L — ABNORMAL LOW (ref 22–32)
Chloride: 101 mmol/L (ref 101–111)
Creatinine, Ser: 0.82 mg/dL (ref 0.44–1.00)
Glucose, Bld: 105 mg/dL — ABNORMAL HIGH (ref 65–99)
Potassium: 3.1 mmol/L — ABNORMAL LOW (ref 3.5–5.1)
SODIUM: 133 mmol/L — AB (ref 135–145)

## 2015-09-23 LAB — CBC
HCT: 32.6 % — ABNORMAL LOW (ref 36.0–46.0)
Hemoglobin: 10.3 g/dL — ABNORMAL LOW (ref 12.0–15.0)
MCH: 24.2 pg — ABNORMAL LOW (ref 26.0–34.0)
MCHC: 31.6 g/dL (ref 30.0–36.0)
MCV: 76.7 fL — ABNORMAL LOW (ref 78.0–100.0)
PLATELETS: 276 10*3/uL (ref 150–400)
RBC: 4.25 MIL/uL (ref 3.87–5.11)
RDW: 17.6 % — AB (ref 11.5–15.5)
WBC: 11.9 10*3/uL — AB (ref 4.0–10.5)

## 2015-09-23 LAB — URINE MICROSCOPIC-ADD ON

## 2015-09-23 LAB — PREGNANCY, URINE: PREG TEST UR: NEGATIVE

## 2015-09-23 MED ORDER — SODIUM CHLORIDE 0.9 % IV BOLUS (SEPSIS)
1000.0000 mL | Freq: Once | INTRAVENOUS | Status: AC
  Administered 2015-09-23: 1000 mL via INTRAVENOUS

## 2015-09-23 MED ORDER — ONDANSETRON HCL 4 MG/2ML IJ SOLN
4.0000 mg | Freq: Once | INTRAMUSCULAR | Status: AC
  Administered 2015-09-23: 4 mg via INTRAVENOUS
  Filled 2015-09-23: qty 2

## 2015-09-23 MED ORDER — MORPHINE SULFATE (PF) 4 MG/ML IV SOLN
4.0000 mg | Freq: Once | INTRAVENOUS | Status: AC
  Administered 2015-09-23: 4 mg via INTRAVENOUS
  Filled 2015-09-23: qty 1

## 2015-09-23 MED ORDER — DEXTROSE 5 % IV SOLN
1.0000 g | Freq: Once | INTRAVENOUS | Status: AC
  Administered 2015-09-23: 1 g via INTRAVENOUS
  Filled 2015-09-23: qty 10

## 2015-09-23 MED ORDER — ACETAMINOPHEN 325 MG PO TABS
650.0000 mg | ORAL_TABLET | Freq: Once | ORAL | Status: AC | PRN
  Administered 2015-09-23: 650 mg via ORAL
  Filled 2015-09-23: qty 2

## 2015-09-23 NOTE — ED Notes (Signed)
Pt had labs drawn while she was in the waiting room and due to the wait, the patient left [facility]BS.  Since then the labs have resulted and were given to Dr Adriana Simasook

## 2015-09-23 NOTE — ED Triage Notes (Signed)
Pt states she was here earlier to be seen and left without being seen. Pt c/o fever and nausea.

## 2015-09-23 NOTE — ED Notes (Signed)
Spoke to patient on phone and pt was told she should return to be evaluated for possible treatment of condition.  Pt states she will try to return this evening.

## 2015-09-23 NOTE — ED Triage Notes (Signed)
Reports of lower back pain that started yesterday. States she has frequent urination. Also reports of fever.

## 2015-09-23 NOTE — ED Notes (Signed)
Per registration, pt left. Pt not in waiting room when called.

## 2015-09-24 MED ORDER — HYDROCODONE-ACETAMINOPHEN 5-325 MG PO TABS: 1.0000 | ORAL_TABLET | ORAL | 0 refills | Status: DC | PRN

## 2015-09-24 MED ORDER — ONDANSETRON 8 MG PO TBDP: 8.0000 mg | ORAL_TABLET | Freq: Three times a day (TID) | ORAL | 0 refills | Status: DC | PRN

## 2015-09-24 MED ORDER — SODIUM CHLORIDE 0.9 % IV BOLUS (SEPSIS)
1000.0000 mL | Freq: Once | INTRAVENOUS | Status: AC
  Administered 2015-09-24: 1000 mL via INTRAVENOUS

## 2015-09-24 MED ORDER — CEPHALEXIN 500 MG PO CAPS: 500.0000 mg | ORAL_CAPSULE | Freq: Three times a day (TID) | ORAL | 0 refills | Status: DC

## 2015-09-24 NOTE — ED Notes (Signed)
Pt alert & oriented x4, stable gait. Patient given discharge instructions, paperwork & prescription(s). Patient informed not to drive, operate any equipment & handel any important documents 4 hours after taking pain medication. Patient  instructed to stop at the registration desk to finish any additional paperwork. Patient  verbalized understanding. Pt left department w/ no further questions. 

## 2015-09-24 NOTE — ED Provider Notes (Signed)
[facility]PT Provider Note   CSN: [id number] Arrival date & time: 09/23/15  2041     History   Chief Complaint Chief Complaint  Patient presents with  . Fever    HPI Kelly Villarreal is a 25 y.o. female presenting with a urinary infection.  She was here earlier this evening, but left prior to being seen. Her lab results from this initial encounter reflect a urinary infection. She describes having slight suprapubic pressure and discomfort for the past week, but was seen by her gyn last week at which time her urine dip was negative.  Since she has increased low back pain, increasing urinary frequency and pain and has spiked fevers, reporting is was 103. Yesterday evening.  She has also been nauseated, but denies emesis.  Her urine has been cloudy, denies hematuria. Denies h/o kidney stones.  The history is provided by the patient and the spouse.    Past Medical History:  Diagnosis Date  . Allergy    seasonal  . Anxiety 03/08/2011  . Depression 03/08/2011  . Nausea and vomiting during pregnancy 08/10/2014  . Threatened miscarriage in early pregnancy 08/10/2014  . Vaginal bleeding in pregnancy 08/10/2014    Patient Active Problem List   Diagnosis Date Noted  . Nexplanon removal 07/21/2015  . Grief reaction 05/31/2015  . Insomnia 05/31/2015  . Seasonal allergies 04/07/2015  . Contraceptive management 01/15/2015  . Rh negative state in antepartum period 09/01/2014    Past Surgical History:  Procedure Laterality Date  . CESAREAN SECTION N/A 12/16/2014   Procedure: CESAREAN SECTION;  Surgeon: Adam Phenix, MD;  Location: [facility];  Service: Obstetrics;  Laterality: N/A;  . MOUTH SURGERY      OB History    Gravida Para Term Preterm AB Living   1 1   1   1    SAB TAB Ectopic Multiple Live Births         0 1       Home Medications    Prior to Admission medications   Medication Sig Start Date End Date Taking? Authorizing Provider  acetaminophen (TYLENOL) 500 MG tablet  Take 500 mg by mouth every 6 (six) hours as needed for mild pain or moderate pain.   Yes Historical Provider, MD  etonogestrel (NEXPLANON) 68 MG IMPL implant 1 each by Subdermal route once.   Yes Historical Provider, MD  ibuprofen (ADVIL,MOTRIN) 200 MG tablet Take 200 mg by mouth every 6 (six) hours as needed for mild pain or moderate pain.   Yes Historical Provider, MD  megestrol (MEGACE) 40 MG tablet Take 1 tablet (40 mg total) by mouth 3 (three) times daily. 07/12/15  Yes Tilda Burrow, MD  Phenylephrine-Pheniramine-DM [facility] COLD & COUGH) 11-05-18 MG PACK Take 1 packet by mouth daily as needed (for cold and cough symptoms).   Yes Historical Provider, MD  Prenatal Vit-Fe Fumarate-FA (PRENATAL VITAMIN PO) Take by mouth daily.   Yes Historical Provider, MD  cephALEXin (KEFLEX) 500 MG capsule Take 1 capsule (500 mg total) by mouth 3 (three) times daily. 09/24/15   Burgess Amor, PA-C  HYDROcodone-acetaminophen (NORCO/VICODIN) 5-325 MG tablet Take 1 tablet by mouth every 4 (four) hours as needed. 09/24/15   Burgess Amor, PA-C  ondansetron (ZOFRAN ODT) 8 MG disintegrating tablet Take 1 tablet (8 mg total) by mouth every 8 (eight) hours as needed for nausea or vomiting. 09/24/15   Burgess Amor, PA-C    Family History Family History  Problem Relation Age of Onset  .  Colon cancer Mother   . Hypertension Father   . Heart disease Maternal Grandmother   . Glaucoma Paternal Grandmother   . Diabetes Maternal Grandfather   . Hypertension Maternal Grandfather     Social History Social History  Substance Use Topics  . Smoking status: Never Smoker  . Smokeless tobacco: Never Used  . Alcohol use Yes     Comment: wine      Allergies   Review of patient's allergies indicates no known allergies.   Review of Systems Review of Systems  Constitutional: Positive for fever.  HENT: Negative for congestion and sore throat.   Eyes: Negative.   Respiratory: Negative for chest tightness and shortness of breath.    Cardiovascular: Negative for chest pain.  Gastrointestinal: Positive for nausea. Negative for vomiting.  Genitourinary: Positive for dysuria, flank pain, frequency and urgency. Negative for hematuria and vaginal discharge.  Musculoskeletal: Negative for arthralgias, joint swelling and neck pain.  Skin: Negative.  Negative for rash and wound.  Neurological: Negative for dizziness, weakness, light-headedness, numbness and headaches.  Psychiatric/Behavioral: Negative.      Physical Exam Updated Vital Signs BP 134/85 (BP Location: Right Arm)   Pulse 107   Temp 99 F (37.2 C) (Oral)   Resp 18   Ht 5\' 6"  (1.676 m)   Wt 66.7 kg   LMP 09/23/2015   SpO2 100%   BMI 23.73 kg/m   Physical Exam  Constitutional: She appears well-developed and well-nourished.  HENT:  Head: Normocephalic and atraumatic.  Eyes: Conjunctivae are normal.  Neck: Normal range of motion.  Cardiovascular: Normal rate, regular rhythm, normal heart sounds and intact distal pulses.   Pulmonary/Chest: Effort normal and breath sounds normal. She has no wheezes.  Abdominal: Soft. Bowel sounds are normal. There is tenderness in the suprapubic area. There is CVA tenderness.  Bilateral low back to cva ttp,   Musculoskeletal: Normal range of motion.  Neurological: She is alert.  Skin: Skin is warm and dry.  Psychiatric: She has a normal mood and affect.  Nursing note and vitals reviewed.    ED Treatments / Results  Labs (all labs ordered are listed, but only abnormal results are displayed) Labs Reviewed  URINE CULTURE    EKG  EKG Interpretation None       Radiology No results found.  Procedures Procedures (including critical care time)  Medications Ordered in ED Medications  cefTRIAXone (ROCEPHIN) 1 g in dextrose 5 % 50 mL IVPB (0 g Intravenous Stopped 09/24/15 0009)  morphine 4 MG/ML injection 4 mg (4 mg Intravenous Given 09/23/15 2336)  ondansetron (ZOFRAN) injection 4 mg (4 mg Intravenous Given  09/23/15 2334)  sodium chloride 0.9 % bolus 1,000 mL (0 mLs Intravenous Stopped 09/24/15 0023)  sodium chloride 0.9 % bolus 1,000 mL (0 mLs Intravenous Stopped 09/24/15 0130)     Initial Impression / Assessment and Plan / ED Course  I have reviewed the triage vital signs and the nursing notes.  Pertinent labs & imaging results that were available during my care of the patient were reviewed by me and considered in my medical decision making (see chart for details).  Clinical Course    Lab results from prior visit reviewed.  Pt with uti/pyelo given febrile illness. She was given rocephin IV, also given 2 L of NS given ketonuria.  Creatinine is stable and clinically does not look dehydrated, but she felt better after getting tx.  She was prescribed keflex, advised increased fluid intake. Plan f/u with pcp  for a recheck - repeat UA after abx completed.  Sooner for any worsened or persistent sx which were discussed with her and husband.   Pt seen by Dr Adriana Simasook during this visit.  Final Clinical Impressions(s) / ED Diagnoses   Final diagnoses:  Pyelonephritis    New Prescriptions Discharge Medication List as of 09/24/2015  1:22 AM    START taking these medications   Details  cephALEXin (KEFLEX) 500 MG capsule Take 1 capsule (500 mg total) by mouth 3 (three) times daily., Starting Fri 09/24/2015, Print    HYDROcodone-acetaminophen (NORCO/VICODIN) 5-325 MG tablet Take 1 tablet by mouth every 4 (four) hours as needed., Starting Fri 09/24/2015, Print    ondansetron (ZOFRAN ODT) 8 MG disintegrating tablet Take 1 tablet (8 mg total) by mouth every 8 (eight) hours as needed for nausea or vomiting., Starting Fri 09/24/2015, Print         Burgess AmorJulie Marcy Sookdeo, PA-C 09/24/15 1439    Donnetta HutchingBrian Cook, MD 09/27/15 [phone]

## 2015-09-26 LAB — URINE CULTURE

## 2015-09-27 ENCOUNTER — Telehealth (HOSPITAL_COMMUNITY): Payer: Self-pay

## 2015-09-27 NOTE — Telephone Encounter (Signed)
Post ED Visit - Positive Culture Follow-up  Culture report reviewed by antimicrobial stewardship pharmacist:  []  Enzo BiNathan Batchelder, Pharm.D. []  Celedonio MiyamotoJeremy Frens, Pharm.D., BCPS []  Garvin FilaMike Maccia, Pharm.D. []  Georgina PillionElizabeth Martin, [address]Ph[address]D., BCPS []  YoungsvilleMinh Pham, [address]Pharm[address]D., BCPS, AAHIVP []  Estella HuskMichelle Turner, Pharm.D., BCPS, AAHIVP []  Tennis Mustassie Stewart, Pharm.D. []  Rob Oswaldo DoneVincent, [address]  Bernie CoveyX  Taylor Stone, Pharm.D.  Positive urine culture, >/= 100,000 colonies -> E Coli Treated with Cephalexin, organism sensitive to the same and no further patient follow-up is required at this time.  Arvid RightClark, Caasi Giglia Dorn 09/27/2015, 12:53 PM

## 2015-10-05 ENCOUNTER — Ambulatory Visit: Payer: Self-pay | Admitting: Family Medicine

## 2015-10-07 ENCOUNTER — Encounter: Payer: Self-pay | Admitting: Physician Assistant

## 2015-10-07 ENCOUNTER — Ambulatory Visit (INDEPENDENT_AMBULATORY_CARE_PROVIDER_SITE_OTHER): Payer: BLUE CROSS/BLUE SHIELD | Admitting: Physician Assistant

## 2015-10-07 VITALS — BP 122/82 | HR 73 | Temp 98.3°F | Resp 16 | Wt 146.0 lb

## 2015-10-07 DIAGNOSIS — N390 Urinary tract infection, site not specified: Secondary | ICD-10-CM

## 2015-10-07 DIAGNOSIS — Z09 Encounter for follow-up examination after completed treatment for conditions other than malignant neoplasm: Secondary | ICD-10-CM

## 2015-10-07 LAB — URINALYSIS, ROUTINE W REFLEX MICROSCOPIC
BILIRUBIN URINE: NEGATIVE
GLUCOSE, UA: NEGATIVE
Hgb urine dipstick: NEGATIVE
Ketones, ur: NEGATIVE
LEUKOCYTES UA: NEGATIVE
Nitrite: NEGATIVE
PH: 7 (ref 5.0–8.0)
Protein, ur: NEGATIVE
Specific Gravity, Urine: 1.015 (ref 1.001–1.035)

## 2015-10-07 NOTE — Progress Notes (Signed)
Patient ID: Kelly Villarreal MRN: [id number], DOB: 05-29-1990, 25 y.o. Date of Encounter: 10/07/2015, 12:37 PM    Chief Complaint:  Chief Complaint  Patient presents with  . Follow-up     HPI: 25 y.o. year old female presents For follow-up after recent ER visit.  I reviewed ER note from 09/23/15. At that time she reported having some slight suprapubic pressure and discomfort. Had some low back pain, increasing urinary frequency, and had some spiked fevers reporting fever had gone up to 103 the prior evening. She also had been nauseated but no emesis. Urine had been cloudy but denied hematuria. Physical exam was notable for CVA tenderness. Bilateral low back tenderness. Mild tenderness of the suprapubic area. Their assessment was that she had UTI/pyelonephritis given febrile illness. She was given Rocephin IV as well as 2 L normal saline, given ketonuria. Creatinine was stable and clinically did not look dehydrated but felt better after getting the treatment. She was prescribed Keflex, advised to increase fluid intake and follow-up PCP for recheck and repeat UA after antibiotics completed. Was prescribed Keflex 500 mg 3 times a day.  Also reviewed note from pharmacist from 09/27/15 which documents that the urine culture was positive for greater than/equal 100,000 colonies Escherichia coli treated with cephalexin organism sensitive to the same and no further patient follow-up required at that time.  Today patient states that she is feeling good. States that all of the symptoms she was having at the time of her ER visit have all resolved. Currently having no dysuria no suprapubic discomfort no urinary frequency or urgency. No back pain. No further fevers and no chills. She has completed all of the Keflex antibiotic and took them as directed. She has no complaints or concerns today.      Home Meds:   Outpatient Medications Prior to Visit  Medication Sig Dispense Refill  . etonogestrel  (NEXPLANON) 68 MG IMPL implant 1 each by Subdermal route once.    Marland Kitchen. HYDROcodone-acetaminophen (NORCO/VICODIN) 5-325 MG tablet Take 1 tablet by mouth every 4 (four) hours as needed. 10 tablet 0  . ibuprofen (ADVIL,MOTRIN) 200 MG tablet Take 200 mg by mouth every 6 (six) hours as needed for mild pain or moderate pain.    . megestrol (MEGACE) 40 MG tablet Take 1 tablet (40 mg total) by mouth 3 (three) times daily. 45 tablet 2  . Prenatal Vit-Fe Fumarate-FA (PRENATAL VITAMIN PO) Take by mouth daily.    Marland Kitchen. acetaminophen (TYLENOL) 500 MG tablet Take 500 mg by mouth every 6 (six) hours as needed for mild pain or moderate pain.    . cephALEXin (KEFLEX) 500 MG capsule Take 1 capsule (500 mg total) by mouth 3 (three) times daily. (Patient not taking: Reported on 10/07/2015) 30 capsule 0  . ondansetron (ZOFRAN ODT) 8 MG disintegrating tablet Take 1 tablet (8 mg total) by mouth every 8 (eight) hours as needed for nausea or vomiting. (Patient not taking: Reported on 10/07/2015) 20 tablet 0  . Phenylephrine-Pheniramine-DM (THERAFLU COLD & COUGH) 11-05-18 MG PACK Take 1 packet by mouth daily as needed (for cold and cough symptoms).     No facility-administered medications prior to visit.     Allergies: No Known Allergies    Review of Systems: See HPI for pertinent ROS. All other ROS negative.    Physical Exam: Blood pressure 122/82, pulse 73, temperature 98.3 F (36.8 C), resp. rate 16, weight 146 lb (66.2 kg), last menstrual period 10/04/2015, not currently breastfeeding., Body mass index  is 23.57 kg/m. General:  WNWD AAF. Appears in no acute distress. Neck: Supple. No thyromegaly. No lymphadenopathy. Lungs: Clear bilaterally to auscultation without wheezes, rales, or rhonchi. Breathing is unlabored. Heart: Regular rhythm. No murmurs, rubs, or gallops. Abdomen: Soft, non-tender, non-distended with normoactive bowel sounds. No hepatomegaly. No rebound/guarding. No obvious abdominal masses. Msk:  Strength  and tone normal for age. No tenderness with percussion to costophrenic angles bilaterally. Extremities/Skin: Warm and dry Neuro: Alert and oriented X 3. Moves all extremities spontaneously. Gait is normal. CNII-XII grossly in tact. Psych:  Responds to questions appropriately with a normal affect.   Results for orders placed or performed in visit on 10/07/15  Urinalysis, Routine w reflex microscopic (not at [facility])  Result Value Ref Range   Color, Urine YELLOW YELLOW   APPearance CLEAR CLEAR   Specific Gravity, Urine 1.015 1.001 - 1.035   pH 7.0 5.0 - 8.0   Glucose, UA NEGATIVE NEGATIVE   Bilirubin Urine NEGATIVE NEGATIVE   Ketones, ur NEGATIVE NEGATIVE   Hgb urine dipstick NEGATIVE NEGATIVE   Protein, ur NEGATIVE NEGATIVE   Nitrite NEGATIVE NEGATIVE   Leukocytes, UA NEGATIVE NEGATIVE     ASSESSMENT AND PLAN:  25 y.o. year old female with  1. Encounter for examination following treatment at hospital All of patient's symptoms and signs of UTI have resolved. Her urinalysis today is normal with no signs of residual infection. She has taken the antibiotic as directed and is completed all of it. No further treatment indicated at this time. Follow-up PRN 2. UTI (lower urinary tract infection) - Urinalysis, Routine w reflex microscopic (not at [facility])   Signed, [facility] [place], [place], [facility] 10/07/2015 12:37 PM

## 2015-10-27 ENCOUNTER — Encounter: Payer: Self-pay | Admitting: Obstetrics and Gynecology

## 2015-10-27 ENCOUNTER — Ambulatory Visit (INDEPENDENT_AMBULATORY_CARE_PROVIDER_SITE_OTHER): Payer: Medicaid Other | Admitting: Obstetrics and Gynecology

## 2015-10-27 VITALS — BP 90/70 | HR 90 | Ht 66.0 in | Wt 145.4 lb

## 2015-10-27 DIAGNOSIS — Z3046 Encounter for surveillance of implantable subdermal contraceptive: Secondary | ICD-10-CM

## 2015-10-27 DIAGNOSIS — Z3049 Encounter for surveillance of other contraceptives: Secondary | ICD-10-CM

## 2015-10-27 NOTE — Progress Notes (Signed)
  GYNECOLOGY CLINIC PROCEDURE NOTE  Kelly Villarreal is a 25 y.o. G1P0101 here for Nexplanon removal .  No other gynecologic concerns.   Nexplanon Removal Patient identified, informed consent performed, consent signed.   Appropriate time out taken. Nexplanon site identified.  Area prepped in usual sterile fashon. One ml of 2% lidocaine was used to anesthetize the area at the distal end of the implant. A small stab incision was made right beside the implant on the distal portion.  The Nexplanon rod was grasped using hemostats and removed without difficulty.  There was minimal blood loss. There were no complications.  3 ml of 2% lidocaine was injected around the incision for post-procedure analgesia.  Steri-strips were applied over the small incision.  A pressure bandage was applied to reduce any bruising.  The patient tolerated the procedure well and was given post procedure instructions.  Patient is not planning to use anything for contraception.    By signing my name below, I, Sonum Patel, attest that this documentation has been prepared under the direction and in the presence of Tilda BurrowJohn V Brand Siever, MD. Electronically Signed: Sonum Patel, Neurosurgeoncribe. 10/27/15. 2:20 PM.  I personally performed the services described in this documentation, which was SCRIBED in my presence. The recorded information has been reviewed and considered accurate. It has been edited as necessary during review. Tilda BurrowFERGUSON,Yola Paradiso V, MD

## 2015-12-23 ENCOUNTER — Ambulatory Visit: Payer: Medicaid Other | Admitting: Advanced Practice Midwife

## 2016-04-17 ENCOUNTER — Ambulatory Visit: Payer: Medicaid Other | Admitting: Family Medicine

## 2016-05-03 ENCOUNTER — Encounter: Payer: Self-pay | Admitting: Family Medicine

## 2016-05-03 ENCOUNTER — Ambulatory Visit (INDEPENDENT_AMBULATORY_CARE_PROVIDER_SITE_OTHER): Payer: BLUE CROSS/BLUE SHIELD | Admitting: Family Medicine

## 2016-05-03 VITALS — BP 112/78 | HR 82 | Temp 98.3°F | Resp 14 | Ht 66.0 in | Wt 145.0 lb

## 2016-05-03 DIAGNOSIS — M7750 Other enthesopathy of unspecified foot: Secondary | ICD-10-CM

## 2016-05-03 DIAGNOSIS — M25562 Pain in left knee: Secondary | ICD-10-CM

## 2016-05-03 DIAGNOSIS — M79671 Pain in right foot: Secondary | ICD-10-CM | POA: Diagnosis not present

## 2016-05-03 MED ORDER — IBUPROFEN 600 MG PO TABS
600.0000 mg | ORAL_TABLET | Freq: Three times a day (TID) | ORAL | 1 refills | Status: DC | PRN
Start: 2016-05-03 — End: 2018-03-20

## 2016-05-03 NOTE — Patient Instructions (Signed)
Get the xrays at [facility] Take Ibuprofen with food F/U pending results

## 2016-05-03 NOTE — Progress Notes (Signed)
   Subjective:    Patient ID: Kelly Villarreal, female    DOB: 1990-09-01, 26 y.o.   MRN: [id number]  Patient presents for L Knee Pain and R Heel/ Ankle Pain  Pt here with  Left knee pain For the past 6 weeks or so. States that she was doing some work on her home was on her knees laying floor tiles but she does not very particular injury. She's not had any swelling the pain is mostly in the inferior part of the knee. She does take ibuprofen occasionally which helps. 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 She never had any type of tear or injury that she is aware of. She is also having right lateral foot pain occasionally into the heel. It is quite painful to touch and when she walks. She has not noted any swelling of the ankle.    Review Of Systems:  GEN- denies fatigue, fever, weight loss,weakness, recent illness HEENT- denies eye drainage, change in vision, nasal discharge, CVS- denies chest pain, palpitations RESP- denies SOB, cough, wheeze ABD- denies N/V, change in stools, abd pain GU- denies dysuria, hematuria, dribbling, incontinence MSK-+joint pain, muscle aches, injury Neuro- denies headache, dizziness, syncope, seizure activity       Objective:    BP 112/78   Pulse 82   Temp 98.3 F (36.8 C) (Oral)   Resp 14   Ht  (1.676 m)   Wt 145 lb (65.8 kg)   LMP 04/15/2016   SpO2 96%   BMI 23.40 kg/m  GEN- NAD, alert and oriented x3 MSK - bilat knee- normal inspection, FROM , mild crepitus bilat, ligaments in tact, TTP inferior left knee, no effusion, patella tracks normally  Right foot normal inspection - FROM, TTP lateral aspect, Bony tenderness over 5th metarsal + squeeze test   Left foot normal inspection - FROM ankle/foot  Achilles in tact bilat Ext- no edema Pulse- DP, PT 2+       Assessment & Plan:      Problem List Items Addressed This Visit    None    Visit Diagnoses    Right foot pain    -  Primary    ? bone spur vs tendinitis/hair line fracture. She has some orthotics which has also helped, obtain xray of foot, start Ibuprofen  BID   Relevant Orders   DG Foot Complete Right   Tendinitis of foot       Acute pain of left knee       Discussed strengthing of quads, no sign of liagmental or menisal tear on exam, obtain xray, unclear swelling in army?   Relevant Orders   DG Knee Complete 4 Views Left      Note: This dictation was prepared with Dragon dictation along with smaller phrase technology. Any transcriptional errors that result from this process are unintentional.

## 2016-05-05 ENCOUNTER — Ambulatory Visit: Payer: Medicaid Other | Admitting: Obstetrics & Gynecology

## 2016-05-08 ENCOUNTER — Ambulatory Visit (HOSPITAL_COMMUNITY)
Admission: RE | Admit: 2016-05-08 | Discharge: 2016-05-08 | Disposition: A | Discharge status: Home / Self Care | Payer: BLUE CROSS/BLUE SHIELD | Source: Ambulatory Visit | Attending: Family Medicine | Admitting: Family Medicine

## 2016-05-08 DIAGNOSIS — M25562 Pain in left knee: Secondary | ICD-10-CM | POA: Diagnosis not present

## 2016-05-08 DIAGNOSIS — R937 Abnormal findings on diagnostic imaging of other parts of musculoskeletal system: Secondary | ICD-10-CM | POA: Diagnosis not present

## 2016-05-08 DIAGNOSIS — M79671 Pain in right foot: Secondary | ICD-10-CM | POA: Insufficient documentation

## 2017-01-05 ENCOUNTER — Encounter: Payer: BLUE CROSS/BLUE SHIELD | Admitting: Adult Health

## 2017-01-17 ENCOUNTER — Ambulatory Visit: Payer: BLUE CROSS/BLUE SHIELD | Admitting: Adult Health

## 2017-01-24 ENCOUNTER — Ambulatory Visit: Payer: BLUE CROSS/BLUE SHIELD | Admitting: Obstetrics and Gynecology

## 2017-02-02 ENCOUNTER — Ambulatory Visit: Payer: BLUE CROSS/BLUE SHIELD | Admitting: Obstetrics and Gynecology

## 2017-08-09 ENCOUNTER — Encounter: Payer: BLUE CROSS/BLUE SHIELD | Admitting: Physician Assistant

## 2017-08-15 ENCOUNTER — Encounter: Payer: Self-pay | Admitting: Family Medicine

## 2017-09-03 ENCOUNTER — Encounter: Payer: BLUE CROSS/BLUE SHIELD | Admitting: Physician Assistant

## 2017-09-06 ENCOUNTER — Encounter: Payer: BLUE CROSS/BLUE SHIELD | Admitting: Physician Assistant

## 2017-09-26 ENCOUNTER — Other Ambulatory Visit: Payer: BLUE CROSS/BLUE SHIELD | Admitting: Obstetrics and Gynecology

## 2017-10-25 ENCOUNTER — Other Ambulatory Visit: Payer: BLUE CROSS/BLUE SHIELD | Admitting: Obstetrics and Gynecology

## 2017-12-17 ENCOUNTER — Other Ambulatory Visit: Payer: BLUE CROSS/BLUE SHIELD | Admitting: Obstetrics and Gynecology

## 2017-12-26 ENCOUNTER — Other Ambulatory Visit: Payer: BLUE CROSS/BLUE SHIELD | Admitting: Obstetrics and Gynecology

## 2018-01-16 NOTE — L&D Delivery Note (Signed)
Operative Note   SURGERY DATE: 10/20/2018  PRE-OP DIAGNOSIS:  *Pregnancy at [redacted]w[redacted]d *History of classical cesarean  POST-OP DIAGNOSIS: Same   PROCEDURE: repeat low transverse cesarean section via pfannenstiel skin incision with double layer uterine closure  SURGEON: Surgeon(s) and Role:    * Aletha Halim, MD - Primary    * Nicolette Bang, DO - Resident - Assisting    * Dione Plover, Annice Needy, MD - Resident - Assisting  ANESTHESIA: spinal  ESTIMATED BLOOD LOSS: 401 cc  DRAINS: 150 mL UOP via indwelling foley  TOTAL IV FLUIDS: 2000 mL crystalloid  VTE PROPHYLAXIS: SCDs to bilateral lower extremities  ANTIBIOTICS: Two grams of Cefazolin were given., within 1 hour of skin incision  SPECIMENS: Placenta to pathology  COMPLICATIONS: none  INDICATIONS: history of classical cesarean  FINDINGS: No intra-abdominal adhesions were noted. Grossly normal uterus, tubes and ovaries. Clear amniotic fluid, cephalic female infant, weight 2850 gm, APGARs 8/9, intact placenta.  PROCEDURE IN DETAIL: The patient was taken to the operating room where anesthesia was administered and normal fetal heart tones were confirmed. She was then prepped and draped in the normal fashion in the dorsal supine position with a leftward tilt.  After a time out was performed, a pfannensteil skin incision was made with the scalpel and carried through to the underlying layer of fascia. The fascia was then incised at the midline and this incision was extended laterally with the mayo scissors. Attention was turned to the superior aspect of the fascial incision which was grasped with the kocher clamps x 2, tented up and the rectus muscles were dissected off with the scalpel. In a similar fashion the inferior aspect of the fascial incision was grasped with the kocher clamps, tented up and the rectus muscles dissected off with the mayo scissors. The rectus muscles were then separated in the midline and the peritoneum was  entered bluntly. The bladder blade was inserted and the vesicouterine peritoneum was identified, tented up and entered with the metzenbaum scissors. This incision was extended laterally and the bladder flap was created digitally. The bladder blade was reinserted.  A low transverse hysterotomy was made with the scalpel until the endometrial cavity was breached and the amniotic sac ruptured, yielding clear amniotic fluid. This incision was extended bluntly and the infant's head, shoulders and body were delivered atraumatically.The cord was clamped x 2 and cut, and the infant was handed to the awaiting pediatricians. The placenta was then gradually expressed from the uterus and then the uterus was exteriorized and cleared of all clots and debris. The hysterotomy was repaired with a running suture of 0 Monocryl. A second imbricating layer of 0 Monocryl suture was then placed.  The uterus and adnexa were then returned to the abdomen, and the hysterotomy and all operative sites were reinspected and excellent hemostasis was noted after irrigation and suction of the abdomen with warm saline.  The peritoneum was closed with a running stitch of 3-0 Vicryl. The fascia was reapproximated with 0 Vicryl in a simple running fashion bilaterally. The subcutaneous layer was less than 3 cm in depth and was not re-approximated. The skin was then closed with 4-0 monocryl, in a subcuticular fashion.  The patient  tolerated the procedure well. Sponge, lap, needle, and instrument counts were correct x 2. The patient was transferred to the recovery room awake, alert and breathing independently in stable condition.  Augustin Coupe, [place] for [employer] manager)

## 2018-02-07 ENCOUNTER — Other Ambulatory Visit: Payer: BLUE CROSS/BLUE SHIELD | Admitting: Obstetrics and Gynecology

## 2018-03-20 ENCOUNTER — Ambulatory Visit (INDEPENDENT_AMBULATORY_CARE_PROVIDER_SITE_OTHER): Payer: Self-pay | Admitting: Adult Health

## 2018-03-20 ENCOUNTER — Encounter: Payer: Self-pay | Admitting: Adult Health

## 2018-03-20 VITALS — BP 112/78 | HR 95 | Ht 66.0 in | Wt 114.0 lb

## 2018-03-20 DIAGNOSIS — Z98891 History of uterine scar from previous surgery: Secondary | ICD-10-CM | POA: Insufficient documentation

## 2018-03-20 DIAGNOSIS — Z3201 Encounter for pregnancy test, result positive: Secondary | ICD-10-CM | POA: Insufficient documentation

## 2018-03-20 DIAGNOSIS — N926 Irregular menstruation, unspecified: Secondary | ICD-10-CM

## 2018-03-20 DIAGNOSIS — O09891 Supervision of other high risk pregnancies, first trimester: Secondary | ICD-10-CM

## 2018-03-20 DIAGNOSIS — Z8751 Personal history of pre-term labor: Secondary | ICD-10-CM | POA: Insufficient documentation

## 2018-03-20 DIAGNOSIS — Z3A01 Less than 8 weeks gestation of pregnancy: Secondary | ICD-10-CM | POA: Insufficient documentation

## 2018-03-20 DIAGNOSIS — O3680X0 Pregnancy with inconclusive fetal viability, not applicable or unspecified: Secondary | ICD-10-CM

## 2018-03-20 DIAGNOSIS — Z7689 Persons encountering health services in other specified circumstances: Secondary | ICD-10-CM | POA: Insufficient documentation

## 2018-03-20 DIAGNOSIS — O09211 Supervision of pregnancy with history of pre-term labor, first trimester: Secondary | ICD-10-CM

## 2018-03-20 HISTORY — DX: History of uterine scar from previous surgery: Z98.891

## 2018-03-20 HISTORY — DX: Personal history of pre-term labor: Z87.51

## 2018-03-20 LAB — POCT URINE PREGNANCY: Preg Test, Ur: POSITIVE — AB

## 2018-03-20 MED ORDER — PROMETHAZINE HCL 25 MG PO TABS: 25.0000 mg | ORAL_TABLET | Freq: Four times a day (QID) | ORAL | 1 refills | Status: DC | PRN

## 2018-03-20 NOTE — Progress Notes (Signed)
Patient ID: Kelly Villarreal, female   DOB: 06-07-1990, 28 y.o.   MRN: [id number] History of Present Illness: Kelly Villarreal is a 28 year old black female, married in for UPT, has missed a period and had +HPT. She had  C-section at 24 weeks for placental abruption, in 2016.  She does not work. PCP is Dr Jeanice Lim.    Current Medications, Allergies, Past Medical History, Past Surgical History, Family History and Social History were reviewed in [employer] electronic medical record.     Review of Systems: +missed period, +HPT +nausea, some vomiting Sleepy and tired    Physical Exam:BP 112/78 (BP Location: Left Arm, Patient Position: Sitting, Cuff Size: Normal)   Pulse 95   Ht 5\' 6"  (1.676 m)   Wt 114 lb (51.7 kg)   LMP 02/03/2018   BMI 18.40 kg/m   UPT +, about 6+4 weeks by LMP with EDD 11/11/2018. General:  Well developed, well nourished, no acute distress Skin:  Warm and dry Neck:  Midline trachea, normal thyroid, good ROM, no lymphadenopathy Lungs; Clear to auscultation bilaterally Cardiovascular: Regular rate and rhythm Abdomen:  Soft, non tender Psych:  No mood changes, alert and cooperative,seems happy Fall risk is low.  Impression: 1. Pregnancy examination or test, positive result   2. Less than [redacted] weeks gestation of pregnancy   3. Encounter to determine fetal viability of pregnancy, single or unspecified fetus   4. History of preterm delivery, currently pregnant in first trimester   5. History of cesarean section       Plan: Continue PNV Eat often  Meds ordered this encounter  Medications  . promethazine (PHENERGAN) 25 MG tablet    Sig: Take 1 tablet (25 mg total) by mouth every 6 (six) hours as needed for nausea or vomiting.    Dispense:  30 tablet    Refill:  1    Order Specific Question:   Supervising Provider    Answer:   Duane Lope H [[id number]]  Return in 1 week for dating [place] and then 3-4 weeks for new OB Review handouts on First trimester and by [facility]

## 2018-03-20 NOTE — Patient Instructions (Signed)
First Trimester of Pregnancy  The first trimester of pregnancy is from week 1 until the end of week 13 (months 1 through 3). A week after a sperm fertilizes an egg, the egg will implant on the wall of the uterus. This embryo will begin to develop into a baby. Genes from you and your partner will form the baby. The female genes will determine whether the baby will be a boy or a girl. At 6-8 weeks, the eyes and face will be formed, and the heartbeat can be seen on ultrasound. At the end of 12 weeks, all the baby's organs will be formed.  Now that you are pregnant, you will want to do everything you can to have a healthy baby. Two of the most important things are to get good prenatal care and to follow your health care provider's instructions. Prenatal care is all the medical care you receive before the baby's birth. This care will help prevent, find, and treat any problems during the pregnancy and childbirth.  Body changes during your first trimester  Your body goes through many changes during pregnancy. The changes vary from woman to woman.   You may gain or lose a couple of pounds at first.   You may feel sick to your stomach (nauseous) and you may throw up (vomit). If the vomiting is uncontrollable, call your health care provider.   You may tire easily.   You may develop headaches that can be relieved by medicines. All medicines should be approved by your health care provider.   You may urinate more often. Painful urination may mean you have a bladder infection.   You may develop heartburn as a result of your pregnancy.   You may develop constipation because certain hormones are causing the muscles that push stool through your intestines to slow down.   You may develop hemorrhoids or swollen veins (varicose veins).   Your breasts may begin to grow larger and become tender. Your nipples may stick out more, and the tissue that surrounds them (areola) may become darker.   Your gums may bleed and may be  sensitive to brushing and flossing.   Dark spots or blotches (chloasma, mask of pregnancy) may develop on your face. This will likely fade after the baby is born.   Your menstrual periods will stop.   You may have a loss of appetite.   You may develop cravings for certain kinds of food.   You may have changes in your emotions from day to day, such as being excited to be pregnant or being concerned that something may go wrong with the pregnancy and baby.   You may have more vivid and strange dreams.   You may have changes in your hair. These can include thickening of your hair, rapid growth, and changes in texture. Some women also have hair loss during or after pregnancy, or hair that feels dry or thin. Your hair will most likely return to normal after your baby is born.  What to expect at prenatal visits  During a routine prenatal visit:   You will be weighed to make sure you and the baby are growing normally.   Your blood pressure will be taken.   Your abdomen will be measured to track your baby's growth.   The fetal heartbeat will be listened to between weeks 10 and 14 of your pregnancy.   Test results from any previous visits will be discussed.  Your health care provider may ask you:     How you are feeling.   If you are feeling the baby move.   If you have had any abnormal symptoms, such as leaking fluid, bleeding, severe headaches, or abdominal cramping.   If you are using any tobacco products, including cigarettes, chewing tobacco, and electronic cigarettes.   If you have any questions.  Other tests that may be performed during your first trimester include:   Blood tests to find your blood type and to check for the presence of any previous infections. The tests will also be used to check for low iron levels (anemia) and protein on red blood cells (Rh antibodies). Depending on your risk factors, or if you previously had diabetes during pregnancy, you may have tests to check for high blood sugar  that affects pregnant women (gestational diabetes).   Urine tests to check for infections, diabetes, or protein in the urine.   An ultrasound to confirm the proper growth and development of the baby.   Fetal screens for spinal cord problems (spina bifida) and Down syndrome.   HIV (human immunodeficiency virus) testing. Routine prenatal testing includes screening for HIV, unless you choose not to have this test.   You may need other tests to make sure you and the baby are doing well.  Follow these instructions at home:  Medicines   Follow your health care provider's instructions regarding medicine use. Specific medicines may be either safe or unsafe to take during pregnancy.   Take a prenatal vitamin that contains at least 600 micrograms (mcg) of folic acid.   If you develop constipation, try taking a stool softener if your health care provider approves.  Eating and drinking     Eat a balanced diet that includes fresh fruits and vegetables, whole grains, good sources of protein such as meat, eggs, or tofu, and low-fat dairy. Your health care provider will help you determine the amount of weight gain that is right for you.   Avoid raw meat and uncooked cheese. These carry germs that can cause birth defects in the baby.   Eating four or five small meals rather than three large meals a day may help relieve nausea and vomiting. If you start to feel nauseous, eating a few soda crackers can be helpful. Drinking liquids between meals, instead of during meals, also seems to help ease nausea and vomiting.   Limit foods that are high in fat and processed sugars, such as fried and sweet foods.   To prevent constipation:  ? Eat foods that are high in fiber, such as fresh fruits and vegetables, whole grains, and beans.  ? Drink enough fluid to keep your urine clear or pale yellow.  Activity   Exercise only as directed by your health care provider. Most women can continue their usual exercise routine during  pregnancy. Try to exercise for 30 minutes at least 5 days a week. Exercising will help you:  ? Control your weight.  ? Stay in shape.  ? Be prepared for labor and delivery.   Experiencing pain or cramping in the lower abdomen or lower back is a good sign that you should stop exercising. Check with your health care provider before continuing with normal exercises.   Try to avoid standing for long periods of time. Move your legs often if you must stand in one place for a long time.   Avoid heavy lifting.   Wear low-heeled shoes and practice good posture.   You may continue to have sex unless your health care   provider tells you not to.  Relieving pain and discomfort   Wear a good support bra to relieve breast tenderness.   Take warm sitz baths to soothe any pain or discomfort caused by hemorrhoids. Use hemorrhoid cream if your health care provider approves.   Rest with your legs elevated if you have leg cramps or low back pain.   If you develop varicose veins in your legs, wear support hose. Elevate your feet for 15 minutes, 3-4 times a day. Limit salt in your diet.  Prenatal care   Schedule your prenatal visits by the twelfth week of pregnancy. They are usually scheduled monthly at first, then more often in the last 2 months before delivery.   Write down your questions. Take them to your prenatal visits.   Keep all your prenatal visits as told by your health care provider. This is important.  Safety   Wear your seat belt at all times when driving.   Make a list of emergency phone numbers, including numbers for family, friends, the hospital, and police and fire departments.  General instructions   Ask your health care provider for a referral to a local prenatal education class. Begin classes no later than the beginning of month 6 of your pregnancy.   Ask for help if you have counseling or nutritional needs during pregnancy. Your health care provider can offer advice or refer you to specialists for help  with various needs.   Do not use hot tubs, steam rooms, or saunas.   Do not douche or use tampons or scented sanitary pads.   Do not cross your legs for long periods of time.   Avoid cat litter boxes and soil used by cats. These carry germs that can cause birth defects in the baby and possibly loss of the fetus by miscarriage or stillbirth.   Avoid all smoking, herbs, alcohol, and medicines not prescribed by your health care provider. Chemicals in these products affect the formation and growth of the baby.   Do not use any products that contain nicotine or tobacco, such as cigarettes and e-cigarettes. If you need help quitting, ask your health care provider. You may receive counseling support and other resources to help you quit.   Schedule a dentist appointment. At home, brush your teeth with a soft toothbrush and be gentle when you floss.  Contact a health care provider if:   You have dizziness.   You have mild pelvic cramps, pelvic pressure, or nagging pain in the abdominal area.   You have persistent nausea, vomiting, or diarrhea.   You have a bad smelling vaginal discharge.   You have pain when you urinate.   You notice increased swelling in your face, hands, legs, or ankles.   You are exposed to fifth disease or chickenpox.   You are exposed to German measles (rubella) and have never had it.  Get help right away if:   You have a fever.   You are leaking fluid from your vagina.   You have spotting or bleeding from your vagina.   You have severe abdominal cramping or pain.   You have rapid weight gain or loss.   You vomit blood or material that looks like coffee grounds.   You develop a severe headache.   You have shortness of breath.   You have any kind of trauma, such as from a fall or a car accident.  Summary   The first trimester of pregnancy is from week 1 until   the end of week 13 (months 1 through 3).   Your body goes through many changes during pregnancy. The changes vary from  woman to woman.   You will have routine prenatal visits. During those visits, your health care provider will examine you, discuss any test results you may have, and talk with you about how you are feeling.  This information is not intended to replace advice given to you by your health care provider. Make sure you discuss any questions you have with your health care provider.  Document Released: 12/27/2000 Document Revised: 12/15/2015 Document Reviewed: 12/15/2015  Elsevier Interactive Patient Education  2019 [employer]

## 2018-04-02 ENCOUNTER — Ambulatory Visit (INDEPENDENT_AMBULATORY_CARE_PROVIDER_SITE_OTHER): Payer: Medicaid Other

## 2018-04-02 ENCOUNTER — Other Ambulatory Visit: Payer: Self-pay

## 2018-04-02 DIAGNOSIS — O3680X0 Pregnancy with inconclusive fetal viability, not applicable or unspecified: Secondary | ICD-10-CM | POA: Diagnosis not present

## 2018-04-02 DIAGNOSIS — Z3A08 8 weeks gestation of pregnancy: Secondary | ICD-10-CM | POA: Diagnosis not present

## 2018-04-02 NOTE — Progress Notes (Signed)
[place] 8+2 wks,single IUP w/ys,fhr 167 bpm,crl 20 mm

## 2018-04-18 ENCOUNTER — Ambulatory Visit: Payer: Medicaid Other | Admitting: *Deleted

## 2018-04-18 ENCOUNTER — Encounter: Payer: Medicaid Other | Admitting: Advanced Practice Midwife

## 2018-05-06 ENCOUNTER — Encounter: Payer: Self-pay | Admitting: *Deleted

## 2018-05-06 ENCOUNTER — Other Ambulatory Visit: Payer: Self-pay | Admitting: Obstetrics and Gynecology

## 2018-05-06 DIAGNOSIS — Z3682 Encounter for antenatal screening for nuchal translucency: Secondary | ICD-10-CM

## 2018-05-07 ENCOUNTER — Ambulatory Visit: Payer: Medicaid Other | Admitting: *Deleted

## 2018-05-07 ENCOUNTER — Ambulatory Visit (INDEPENDENT_AMBULATORY_CARE_PROVIDER_SITE_OTHER): Payer: Medicaid Other

## 2018-05-07 ENCOUNTER — Ambulatory Visit (INDEPENDENT_AMBULATORY_CARE_PROVIDER_SITE_OTHER): Payer: Medicaid Other | Admitting: Women's Health

## 2018-05-07 ENCOUNTER — Encounter: Payer: Self-pay | Admitting: Women's Health

## 2018-05-07 ENCOUNTER — Other Ambulatory Visit: Payer: Self-pay

## 2018-05-07 VITALS — BP 119/78 | HR 86 | Wt 113.0 lb

## 2018-05-07 DIAGNOSIS — Z3A13 13 weeks gestation of pregnancy: Secondary | ICD-10-CM | POA: Diagnosis not present

## 2018-05-07 DIAGNOSIS — Z3481 Encounter for supervision of other normal pregnancy, first trimester: Secondary | ICD-10-CM

## 2018-05-07 DIAGNOSIS — O09212 Supervision of pregnancy with history of pre-term labor, second trimester: Secondary | ICD-10-CM

## 2018-05-07 DIAGNOSIS — Z331 Pregnant state, incidental: Secondary | ICD-10-CM

## 2018-05-07 DIAGNOSIS — Z3402 Encounter for supervision of normal first pregnancy, second trimester: Secondary | ICD-10-CM

## 2018-05-07 DIAGNOSIS — O26892 Other specified pregnancy related conditions, second trimester: Secondary | ICD-10-CM

## 2018-05-07 DIAGNOSIS — Z8751 Personal history of pre-term labor: Secondary | ICD-10-CM

## 2018-05-07 DIAGNOSIS — Z3682 Encounter for antenatal screening for nuchal translucency: Secondary | ICD-10-CM | POA: Diagnosis not present

## 2018-05-07 DIAGNOSIS — Z1379 Encounter for other screening for genetic and chromosomal anomalies: Secondary | ICD-10-CM

## 2018-05-07 DIAGNOSIS — Z1389 Encounter for screening for other disorder: Secondary | ICD-10-CM

## 2018-05-07 DIAGNOSIS — Z23 Encounter for immunization: Secondary | ICD-10-CM | POA: Diagnosis not present

## 2018-05-07 DIAGNOSIS — O26899 Other specified pregnancy related conditions, unspecified trimester: Secondary | ICD-10-CM

## 2018-05-07 DIAGNOSIS — Z3482 Encounter for supervision of other normal pregnancy, second trimester: Secondary | ICD-10-CM

## 2018-05-07 DIAGNOSIS — Z98891 History of uterine scar from previous surgery: Secondary | ICD-10-CM

## 2018-05-07 DIAGNOSIS — Z363 Encounter for antenatal screening for malformations: Secondary | ICD-10-CM

## 2018-05-07 DIAGNOSIS — Z6791 Unspecified blood type, Rh negative: Secondary | ICD-10-CM

## 2018-05-07 DIAGNOSIS — Z3490 Encounter for supervision of normal pregnancy, unspecified, unspecified trimester: Secondary | ICD-10-CM | POA: Insufficient documentation

## 2018-05-07 LAB — POCT URINALYSIS DIPSTICK OB
Blood, UA: NEGATIVE
Glucose, UA: NEGATIVE
Ketones, UA: NEGATIVE
Leukocytes, UA: NEGATIVE
Nitrite, UA: NEGATIVE
POC,PROTEIN,UA: NEGATIVE

## 2018-05-07 NOTE — Progress Notes (Signed)
TELEHEALTH VIRTUAL INITIAL OBSTETRICAL VISIT ENCOUNTER NOTE Patient name: Kelly Villarreal MRN [id number]  Date of birth: [birth year]  I connected with patient on 05/07/18 at 12:00 PM EDT by [facility] and verified that I am speaking with the correct person using two identifiers. Due to COVID-19 recommendations, pt is not currently in our office. Pt here earlier for nt/it u/s and intake w/ Tish.    I discussed the limitations, risks, security and privacy concerns of performing an evaluation and management service by telephone and the availability of in person appointments. I also discussed with the patient that there may be a patient responsible charge related to this service. The patient expressed understanding and agreed to proceed.  Chief Complaint:   Initial Prenatal Visit (NT/IT)  History of Present Illness:   Kelly Villarreal is a 28 y.o. G54P0100 African American female at [redacted]w[redacted]d by LMP c/w 8wk u/s, with an Estimated Date of Delivery: 11/10/18 being evaluated today for her initial obstetrical visit.   Her obstetrical history is significant for 24.5wk PTB w/ neonatal demise- went to ED that am w/ bad cramping, water broke in ED, began bleeding, had stat C/S upon arrival to [facility] d/t FHR 100s, classical incision. Had low-lying placenta.  Today she reports no complaints.  Patient's last menstrual period was 02/03/2018. Last pap 09/16/15. Results were: normal Review of Systems:   Pertinent items are noted in HPI Denies cramping/contractions, leakage of fluid, vaginal bleeding, abnormal vaginal discharge w/ itching/odor/irritation, headaches, visual changes, shortness of breath, chest pain, abdominal pain, severe nausea/vomiting, or problems with urination or bowel movements unless otherwise stated above.  Pertinent History Reviewed:  Reviewed past medical,surgical, social, obstetrical and family history.  Reviewed problem list, medications and allergies. OB History  Gravida Para Term Preterm AB Living   2 1   1    0  SAB TAB Ectopic Multiple Live Births        0 1    # Outcome Date GA Lbr Len/2nd Weight Sex Delivery Anes PTL Lv  2 Current           1 Preterm 12/16/14 [redacted]w[redacted]d  1 lb 14.3 oz (0.86 kg) M CS-LTranv Gen  ND     Complications: History of preterm premature rupture of membranes (PPROM), Placental abruption   Physical Assessment:   Vitals:   05/07/18 1200  BP: 119/78  Pulse: 86  Weight: 113 lb (51.3 kg)  Body mass index is 18.24 kg/m.       Physical Examination:  General:  Alert, oriented and cooperative.   Mental Status: Normal mood and affect perceived. Normal judgment and thought content.  Rest of physical exam deferred due to type of encounter  Results for orders placed or performed in visit on 05/07/18 (from the past 24 hour(s))  POC Urinalysis Dipstick OB   Collection Time: 05/07/18 12:12 PM  Result Value Ref Range   Color, UA     Clarity, UA     Glucose, UA Negative Negative   Bilirubin, UA     Ketones, UA neg    Spec Grav, UA     Blood, UA neg    pH, UA     POC,PROTEIN,UA Negative Negative, Trace, Small (1+), Moderate (2+), Large (3+), 4+   Urobilinogen, UA     Nitrite, UA neg    Leukocytes, UA Negative Negative   Appearance     Odor      Assessment & Plan:  1) Low-Risk Pregnancy G2P0100 at [redacted]w[redacted]d with an Estimated  Date of Delivery: 11/10/18   2) Initial OB visit  3) H/O 24.5wk PTB> d/t cramping/PPROM, low-lying placenta, FHR 100s. Discussed Makena-will come by tomorrow to sign form, wants, does not want to give herself shots at home  4) Prev classical c/s> @ 24.5wks, will need RCS @ 37wks  Meds: No orders of the defined types were placed in this encounter.   Initial labs obtained Continue prenatal vitamins Reviewed n/v relief measures and warning s/s to report Reviewed recommended weight gain based on pre-gravid BMI Encouraged well-balanced diet Genetic Screening discussed: requested nt/it, MaterniT21 Cystic fibrosis, SMA, Fragile X screening  discussed requested Ultrasound discussed; fetal survey: requested CCNC completed>PCM not here, form faxed Has home BP cuff Check BP weekly, let us know if >140/90   I discussed the assessment and treatment plan with the patient. The patient was provided an opportunity to ask questions and all were answered. The patient agreed with the plan and demonstrated an understanding of the instructions.   The patient was advised to call back or seek an in-person evaluation/go to the ED for any concerning postpartum symptoms.  I provided 15 minutes of face-to-face time during this encounter.  Follow-up: Return for 5/25 , ZO:XWRUEAVS:Anatomy, LROB, 2nd IT.   Orders Placed This Encounter  Procedures  . GC/Chlamydia Probe Amp  . Urine Culture  . US OB Comp + 14 Wk  . Flu Vaccine QUAD 36+ mos IM  . Integrated 1  . Obstetric Panel, Including HIV  . Pain Management Screening Profile (10S)  . POC Urinalysis Dipstick OB    Cheral MarkerKimberly R Bryndon Cumbie CNM, [facility]NP-BC 05/07/2018 1:51 PM

## 2018-05-07 NOTE — Progress Notes (Signed)
[place] 13+2 wks,measurements c/w dates,normal ovaries bilat,anterior placenta gr 0,fhr 148 bpm,crl 80.26 mm,NB present,NT 1.7 mm

## 2018-05-07 NOTE — Patient Instructions (Signed)
Kelly Villarreal, I greatly value your feedback.  If you receive a survey following your visit with Kelly Villarreal today, we appreciate you taking the time to fill it out.  Thanks, Kelly Villarreal, CNM, [facility]NP-BC  [facility] is now [facility] ([address] [place], [place] [address]) Entrance located off of [address] Free 24/7 valet parking    Nausea & Vomiting  Have saltine crackers or pretzels by your bed and eat a few bites before you raise your head out of bed in the morning  Eat small frequent meals throughout the day instead of large meals  Drink plenty of fluids throughout the day to stay hydrated, just don't drink a lot of fluids with your meals.  This can make your stomach fill up faster making you feel sick  Do not brush your teeth right after you eat  Products with real ginger are good for nausea, like ginger ale and ginger hard candy Make sure it says made with real ginger!  Sucking on sour candy like lemon heads is also good for nausea  If your prenatal vitamins make you nauseated, take them at night so you will sleep through the nausea  Sea Bands  If you feel like you need medicine for the nausea & vomiting please let Kelly Villarreal know  If you are unable to keep any fluids or food down please let Kelly Villarreal know   Constipation  Drink plenty of fluid, preferably water, throughout the day  Eat foods high in fiber such as fruits, vegetables, and grains  Exercise, such as walking, is a good way to keep your bowels regular  Drink warm fluids, especially warm prune juice, or decaf coffee  Eat a 1/2 cup of real oatmeal (not instant), 1/2 cup applesauce, and 1/2-1 cup warm prune juice every day  If needed, you may take Colace (docusate sodium) stool softener once or twice a day to help keep the stool soft. If you are pregnant, wait until you are out of your first trimester (12-14 weeks of pregnancy)  If you still are having problems with  constipation, you may take Miralax once daily as needed to help keep your bowels regular.  If you are pregnant, wait until you are out of your first trimester (12-14 weeks of pregnancy)  Home Blood Pressure Monitoring for Patients   Your provider has recommended that you check your blood pressure (BP) at least once a week at home. If you do not have a blood pressure cuff at home, one will be provided for you. Contact your provider if you have not received your monitor within 1 week.   Helpful Tips for Accurate Home Blood Pressure Checks  . Don't smoke, exercise, or drink caffeine 30 minutes before checking your BP . Use the restroom before checking your BP (a full bladder can raise your pressure) . Relax in a comfortable upright chair . Feet on the ground . Left arm resting comfortably on a flat surface at the level of your heart . Legs uncrossed . Back supported . Sit quietly and don't talk . Place the cuff on your bare arm . Adjust snuggly, so that only two fingertips can fit between your skin and the top of the cuff . Check 2 readings separated by at least one minute . Keep a log of your BP readings . For a visual, please reference this diagram: http://ccnc.care/bpdiagram  [facility]     Phone:  [phone]  Zone 1: ALL CLEAR  Continue to monitor your symptoms:  . BP reading is less than 140 (top number) or less than 90 (bottom number)  . No right upper stomach pain . No headaches or seeing spots . No feeling nauseated or throwing up . No swelling in face and hands  Zone 2: CAUTION Call your doctor's office for any of the following:  . BP reading is greater than 140 (top number) or greater than 90 (bottom number)  . Stomach pain under your ribs in the middle or right side . Headaches or seeing spots . Feeling nauseated or throwing up . Swelling in face and hands  Zone 3: EMERGENCY  Seek immediate medical care if you have any of the following:  . BP  reading is greater than160 (top number) or greater than 110 (bottom number) . Severe headaches not improving with Tylenol . Serious difficulty catching your breath . Any worsening symptoms from Zone 2     First Trimester of Pregnancy The first trimester of pregnancy is from week 1 until the end of week 12 (months 1 through 3). A week after a sperm fertilizes an egg, the egg will implant on the wall of the uterus. This embryo will begin to develop into a baby. Genes from you and your partner are forming the baby. The female genes determine whether the baby is a boy or a girl. At 6-8 weeks, the eyes and face are formed, and the heartbeat can be seen on ultrasound. At the end of 12 weeks, all the baby's organs are formed.  Now that you are pregnant, you will want to do everything you can to have a healthy baby. Two of the most important things are to get good prenatal care and to follow your health care provider's instructions. Prenatal care is all the medical care you receive before the baby's birth. This care will help prevent, find, and treat any problems during the pregnancy and childbirth. BODY CHANGES Your body goes through many changes during pregnancy. The changes vary from woman to woman.   You may gain or lose a couple of pounds at first.  You may feel sick to your stomach (nauseous) and throw up (vomit). If the vomiting is uncontrollable, call your health care provider.  You may tire easily.  You may develop headaches that can be relieved by medicines approved by your health care provider.  You may urinate more often. Painful urination may mean you have a bladder infection.  You may develop heartburn as a result of your pregnancy.  You may develop constipation because certain hormones are causing the muscles that push waste through your intestines to slow down.  You may develop hemorrhoids or swollen, bulging veins (varicose veins).  Your breasts may begin to grow larger and become  tender. Your nipples may stick out more, and the tissue that surrounds them (areola) may become darker.  Your gums may bleed and may be sensitive to brushing and flossing.  Dark spots or blotches (chloasma, mask of pregnancy) may develop on your face. This will likely fade after the baby is born.  Your menstrual periods will stop.  You may have a loss of appetite.  You may develop cravings for certain kinds of food.  You may have changes in your emotions from day to day, such as being excited to be pregnant or being concerned that something may go wrong with the pregnancy and baby.  You may have more vivid and strange  dreams.  You may have changes in your hair. These can include thickening of your hair, rapid growth, and changes in texture. Some women also have hair loss during or after pregnancy, or hair that feels dry or thin. Your hair will most likely return to normal after your baby is born. WHAT TO EXPECT AT YOUR PRENATAL VISITS During a routine prenatal visit:  You will be weighed to make sure you and the baby are growing normally.  Your blood pressure will be taken.  Your abdomen will be measured to track your baby's growth.  The fetal heartbeat will be listened to starting around week 10 or 12 of your pregnancy.  Test results from any previous visits will be discussed. Your health care provider may ask you:  How you are feeling.  If you are feeling the baby move.  If you have had any abnormal symptoms, such as leaking fluid, bleeding, severe headaches, or abdominal cramping.  If you have any questions. Other tests that may be performed during your first trimester include:  Blood tests to find your blood type and to check for the presence of any previous infections. They will also be used to check for low iron levels (anemia) and Rh antibodies. Later in the pregnancy, blood tests for diabetes will be done along with other tests if problems develop.  Urine tests to  check for infections, diabetes, or protein in the urine.  An ultrasound to confirm the proper growth and development of the baby.  An amniocentesis to check for possible genetic problems.  Fetal screens for spina bifida and Down syndrome.  You may need other tests to make sure you and the baby are doing well. HOME CARE INSTRUCTIONS  Medicines  Follow your health care provider's instructions regarding medicine use. Specific medicines may be either safe or unsafe to take during pregnancy.  Take your prenatal vitamins as directed.  If you develop constipation, try taking a stool softener if your health care provider approves. Diet  Eat regular, well-balanced meals. Choose a variety of foods, such as meat or vegetable-based protein, fish, milk and low-fat dairy products, vegetables, fruits, and whole grain breads and cereals. Your health care provider will help you determine the amount of weight gain that is right for you.  Avoid raw meat and uncooked cheese. These carry germs that can cause birth defects in the baby.  Eating four or five small meals rather than three large meals a day may help relieve nausea and vomiting. If you start to feel nauseous, eating a few soda crackers can be helpful. Drinking liquids between meals instead of during meals also seems to help nausea and vomiting.  If you develop constipation, eat more high-fiber foods, such as fresh vegetables or fruit and whole grains. Drink enough fluids to keep your urine clear or pale yellow. Activity and Exercise  Exercise only as directed by your health care provider. Exercising will help you:  Control your weight.  Stay in shape.  Be prepared for labor and delivery.  Experiencing pain or cramping in the lower abdomen or low back is a good sign that you should stop exercising. Check with your health care provider before continuing normal exercises.  Try to avoid standing for long periods of time. Move your legs often  if you must stand in one place for a long time.  Avoid heavy lifting.  Wear low-heeled shoes, and practice good posture.  You may continue to have sex unless your health care provider directs  you otherwise. Relief of Pain or Discomfort  Wear a good support bra for breast tenderness.    Take warm sitz baths to soothe any pain or discomfort caused by hemorrhoids. Use hemorrhoid cream if your health care provider approves.    Rest with your legs elevated if you have leg cramps or low back pain.  If you develop varicose veins in your legs, wear support hose. Elevate your feet for 15 minutes, 3-4 times a day. Limit salt in your diet. Prenatal Care  Schedule your prenatal visits by the twelfth week of pregnancy. They are usually scheduled monthly at first, then more often in the last 2 months before delivery.  Write down your questions. Take them to your prenatal visits.  Keep all your prenatal visits as directed by your health care provider. Safety  Wear your seat belt at all times when driving.  Make a list of emergency phone numbers, including numbers for family, friends, the hospital, and police and fire departments. General Tips  Ask your health care provider for a referral to a local prenatal education class. Begin classes no later than at the beginning of month 6 of your pregnancy.  Ask for help if you have counseling or nutritional needs during pregnancy. Your health care provider can offer advice or refer you to specialists for help with various needs.  Do not use hot tubs, steam rooms, or saunas.  Do not douche or use tampons or scented sanitary pads.  Do not cross your legs for long periods of time.  Avoid cat litter boxes and soil used by cats. These carry germs that can cause birth defects in the baby and possibly loss of the fetus by miscarriage or stillbirth.  Avoid all smoking, herbs, alcohol, and medicines not prescribed by your health care provider. Chemicals in  these affect the formation and growth of the baby.  Schedule a dentist appointment. At home, brush your teeth with a soft toothbrush and be gentle when you floss. SEEK MEDICAL CARE IF:   You have dizziness.  You have mild pelvic cramps, pelvic pressure, or nagging pain in the abdominal area.  You have persistent nausea, vomiting, or diarrhea.  You have a bad smelling vaginal discharge.  You have pain with urination.  You notice increased swelling in your face, hands, legs, or ankles. SEEK IMMEDIATE MEDICAL CARE IF:   You have a fever.  You are leaking fluid from your vagina.  You have spotting or bleeding from your vagina.  You have severe abdominal cramping or pain.  You have rapid weight gain or loss.  You vomit blood or material that looks like coffee grounds.  You are exposed to [place] measles and have never had them.  You are exposed to fifth disease or chickenpox.  You develop a severe headache.  You have shortness of breath.  You have any kind of trauma, such as from a fall or a car accident. Document Released: 12/27/2000 Document Revised: 05/19/2013 Document Reviewed: 11/12/2012 [facility] Patient Information 2015 [place], [place]. This information is not intended to replace advice given to you by your health care provider. Make sure you discuss any questions you have with your health care provider.  Coronavirus (COVID-19) Are you at risk?  Are you at risk for the Coronavirus (COVID-19)?  To be considered HIGH RISK for Coronavirus (COVID-19), you have to meet the following criteria:  . Traveled to [place], [place], [place], [place] or [place]; or in the [place] to [place], [place], [place], or [place]; and have fever, cough, and shortness of breath within the last 2 weeks of travel OR . Been in close contact with a person diagnosed with COVID-19 within the last 2 weeks and have fever, cough, and shortness of breath . IF YOU DO NOT MEET THESE  CRITERIA, YOU ARE CONSIDERED LOW RISK FOR COVID-19.  What to do if you are HIGH RISK for COVID-19?  Marland Kitchen If you are having a medical emergency, call 911. . Seek medical care right away. Before you go to a doctor's office, urgent care or emergency department, call ahead and tell them about your recent travel, contact with someone diagnosed with COVID-19, and your symptoms. You should receive instructions from your physician's office regarding next steps of care.  . When you arrive at healthcare provider, tell the healthcare staff immediately you have returned from visiting [place], [place], [place], [place] or [place]; or traveled in the [place] to [place], [place], [place], or [place]; in the last two weeks or you have been in close contact with a person diagnosed with COVID-19 in the last 2 weeks.   . Tell the health care staff about your symptoms: fever, cough and shortness of breath. . After you have been seen by a medical provider, you will be either: o Tested for (COVID-19) and discharged home on quarantine except to seek medical care if symptoms worsen, and asked to  - Stay home and avoid contact with others until you get your results (4-5 days)  - Avoid travel on public transportation if possible (such as bus, train, or airplane) or o Sent to the Emergency Department by EMS for evaluation, COVID-19 testing, and possible admission depending on your condition and test results.  What to do if you are LOW RISK for COVID-19?  Reduce your risk of any infection by using the same precautions used for avoiding the common cold or flu:  Marland Kitchen Wash your hands often with soap and warm water for at least 20 seconds.  If soap and water are not readily available, use an alcohol-based hand sanitizer with at least 60% alcohol.  . If coughing or sneezing, cover your mouth and nose by coughing or sneezing into the elbow areas of your shirt or coat, into a tissue or into your sleeve (not your hands). .  Avoid shaking hands with others and consider head nods or verbal greetings only. . Avoid touching your eyes, nose, or mouth with unwashed hands.  . Avoid close contact with people who are sick. . Avoid places or events with large numbers of people in one location, like concerts or sporting events. . Carefully consider travel plans you have or are making. . If you are planning any travel outside or inside the [place], visit the CDC's Travelers' Health webpage for the latest health notices. . If you have some symptoms but not all symptoms, continue to monitor at home and seek medical attention if your symptoms worsen. . If you are having a medical emergency, call 911.   ADDITIONAL HEALTHCARE OPTIONS FOR PATIENTS  [facility] / e-Visit: [email or web address]         [facility]: [phone]  [facility]: [phone]                   [facility]: [phone]

## 2018-05-08 LAB — PMP SCREEN PROFILE (10S), URINE
Amphetamine Scrn, Ur: NEGATIVE ng/mL
BARBITURATE SCREEN URINE: NEGATIVE ng/mL
BENZODIAZEPINE SCREEN, URINE: NEGATIVE ng/mL
CANNABINOIDS UR QL SCN: NEGATIVE ng/mL
Cocaine (Metab) Scrn, Ur: NEGATIVE ng/mL
Creatinine(Crt), U: 154.7 mg/dL (ref 20.0–300.0)
Methadone Screen, Urine: NEGATIVE ng/mL
OXYCODONE+OXYMORPHONE UR QL SCN: NEGATIVE ng/mL
Opiate Scrn, Ur: NEGATIVE ng/mL
Ph of Urine: 7.4 (ref 4.5–8.9)
Phencyclidine Qn, Ur: NEGATIVE ng/mL
Propoxyphene Scrn, Ur: NEGATIVE ng/mL

## 2018-05-08 LAB — GC/CHLAMYDIA PROBE AMP
Chlamydia trachomatis, NAA: NEGATIVE
Neisseria Gonorrhoeae by PCR: NEGATIVE

## 2018-05-09 LAB — OBSTETRIC PANEL, INCLUDING HIV
Antibody Screen: NEGATIVE
Basophils Absolute: 0 10*3/uL (ref 0.0–0.2)
Basos: 0 %
EOS (ABSOLUTE): 0.1 10*3/uL (ref 0.0–0.4)
Eos: 1 %
HIV Screen 4th Generation wRfx: NONREACTIVE
Hematocrit: 36.8 % (ref 34.0–46.6)
Hemoglobin: 12.2 g/dL (ref 11.1–15.9)
Hepatitis B Surface Ag: NEGATIVE
Immature Grans (Abs): 0 10*3/uL (ref 0.0–0.1)
Immature Granulocytes: 0 %
Lymphocytes Absolute: 2 10*3/uL (ref 0.7–3.1)
Lymphs: 31 %
MCH: 31 pg (ref 26.6–33.0)
MCHC: 33.2 g/dL (ref 31.5–35.7)
MCV: 93 fL (ref 79–97)
Monocytes Absolute: 0.6 10*3/uL (ref 0.1–0.9)
Monocytes: 9 %
Neutrophils Absolute: 3.7 10*3/uL (ref 1.4–7.0)
Neutrophils: 59 %
Platelets: 267 10*3/uL (ref 150–450)
RBC: 3.94 x10E6/uL (ref 3.77–5.28)
RDW: 14.7 % (ref 11.7–15.4)
RPR Ser Ql: NONREACTIVE
Rh Factor: NEGATIVE
Rubella Antibodies, IGG: 1.53 index (ref >0.99)
WBC: 6.3 10*3/uL (ref 3.4–10.8)

## 2018-05-09 LAB — INTEGRATED 1
Crown Rump Length: 80.3 mm
Gest. Age on Collection Date: 13.7 weeks
Maternal Age at EDD: 29.4 yr
Nuchal Translucency (NT): 1.7 mm
Number of Fetuses: 1
PAPP-A Value: 3212.4 ng/mL
Weight: 113 [lb_av]

## 2018-05-09 LAB — URINE CULTURE

## 2018-05-27 ENCOUNTER — Ambulatory Visit: Payer: Medicaid Other

## 2018-05-27 ENCOUNTER — Other Ambulatory Visit (INDEPENDENT_AMBULATORY_CARE_PROVIDER_SITE_OTHER): Payer: Medicaid Other

## 2018-05-27 ENCOUNTER — Other Ambulatory Visit: Payer: Self-pay

## 2018-05-27 DIAGNOSIS — Z3482 Encounter for supervision of other normal pregnancy, second trimester: Secondary | ICD-10-CM

## 2018-05-27 DIAGNOSIS — Z1379 Encounter for other screening for genetic and chromosomal anomalies: Secondary | ICD-10-CM | POA: Diagnosis not present

## 2018-05-27 DIAGNOSIS — O09212 Supervision of pregnancy with history of pre-term labor, second trimester: Secondary | ICD-10-CM

## 2018-05-27 DIAGNOSIS — Z3A16 16 weeks gestation of pregnancy: Secondary | ICD-10-CM | POA: Diagnosis not present

## 2018-05-27 DIAGNOSIS — Z8751 Personal history of pre-term labor: Secondary | ICD-10-CM

## 2018-05-27 MED ORDER — HYDROXYPROGESTERONE CAPROATE 275 MG/1.1ML ~~LOC~~ SOAJ
275.0000 mg | Freq: Once | SUBCUTANEOUS | Status: AC
  Administered 2018-05-27: 275 mg via SUBCUTANEOUS

## 2018-05-27 NOTE — Progress Notes (Signed)
17P given in right arm with no complications. Patient to return next week for next injection.

## 2018-05-30 ENCOUNTER — Encounter: Payer: Self-pay | Admitting: *Deleted

## 2018-06-01 LAB — MATERNIT 21 PLUS CORE, BLOOD
Fetal Fraction: 12
Result (T21): NEGATIVE
Trisomy 13 (Patau syndrome): NEGATIVE
Trisomy 18 (Edwards syndrome): NEGATIVE
Trisomy 21 (Down syndrome): NEGATIVE

## 2018-06-03 ENCOUNTER — Ambulatory Visit: Payer: Medicaid Other

## 2018-06-03 ENCOUNTER — Other Ambulatory Visit: Payer: Self-pay

## 2018-06-03 VITALS — BP 96/60 | HR 107 | Temp 98.7°F | Wt 116.0 lb

## 2018-06-03 DIAGNOSIS — Z3482 Encounter for supervision of other normal pregnancy, second trimester: Secondary | ICD-10-CM

## 2018-06-03 DIAGNOSIS — O09892 Supervision of other high risk pregnancies, second trimester: Secondary | ICD-10-CM

## 2018-06-03 MED ORDER — HYDROXYPROGESTERONE CAPROATE 275 MG/1.1ML ~~LOC~~ SOAJ
275.0000 mg | Freq: Once | SUBCUTANEOUS | Status: AC
  Administered 2018-06-12: 11:00:00 275 mg via SUBCUTANEOUS

## 2018-06-03 NOTE — Progress Notes (Signed)
Pt here for Makena 275 mg SQ lt arm. Tolerated well. Tolerated well.return 1 week for neg injection. Pad CMA

## 2018-06-11 ENCOUNTER — Encounter: Payer: Self-pay | Admitting: *Deleted

## 2018-06-11 ENCOUNTER — Other Ambulatory Visit: Payer: Medicaid Other

## 2018-06-12 ENCOUNTER — Ambulatory Visit (INDEPENDENT_AMBULATORY_CARE_PROVIDER_SITE_OTHER): Payer: Medicaid Other

## 2018-06-12 ENCOUNTER — Ambulatory Visit (INDEPENDENT_AMBULATORY_CARE_PROVIDER_SITE_OTHER): Payer: Medicaid Other | Admitting: Women's Health

## 2018-06-12 ENCOUNTER — Encounter: Payer: Self-pay | Admitting: Women's Health

## 2018-06-12 ENCOUNTER — Other Ambulatory Visit: Payer: Self-pay

## 2018-06-12 VITALS — BP 103/57 | HR 83 | Wt 119.0 lb

## 2018-06-12 DIAGNOSIS — Z3482 Encounter for supervision of other normal pregnancy, second trimester: Secondary | ICD-10-CM

## 2018-06-12 DIAGNOSIS — Z1379 Encounter for other screening for genetic and chromosomal anomalies: Secondary | ICD-10-CM

## 2018-06-12 DIAGNOSIS — Z3A18 18 weeks gestation of pregnancy: Secondary | ICD-10-CM

## 2018-06-12 DIAGNOSIS — O09212 Supervision of pregnancy with history of pre-term labor, second trimester: Secondary | ICD-10-CM

## 2018-06-12 DIAGNOSIS — Z363 Encounter for antenatal screening for malformations: Secondary | ICD-10-CM | POA: Diagnosis not present

## 2018-06-12 DIAGNOSIS — Z1389 Encounter for screening for other disorder: Secondary | ICD-10-CM

## 2018-06-12 DIAGNOSIS — Z331 Pregnant state, incidental: Secondary | ICD-10-CM

## 2018-06-12 DIAGNOSIS — O09892 Supervision of other high risk pregnancies, second trimester: Secondary | ICD-10-CM

## 2018-06-12 DIAGNOSIS — Z3481 Encounter for supervision of other normal pregnancy, first trimester: Secondary | ICD-10-CM

## 2018-06-12 LAB — POCT URINALYSIS DIPSTICK OB
Blood, UA: NEGATIVE
Glucose, UA: NEGATIVE
Ketones, UA: NEGATIVE
Leukocytes, UA: NEGATIVE
Nitrite, UA: NEGATIVE
POC,PROTEIN,UA: NEGATIVE

## 2018-06-12 NOTE — Patient Instructions (Addendum)
Kelly Villarreal, I greatly value your feedback.  If you receive a survey following your visit with us today, we appreciate you taking the time to fill it out.  Thanks, [name] Oleva Koo, CNM, [facility]  [facility] is now [facility] ([address] [place] [address]) Entrance located off of [address] Free 24/7 valet parking   Childbirth classes online: [email or web address]  [place] Pediatricians/Family Doctors:  [facility] [phone]            [facility] [phone]                 [facility] [phone] (usually not accepting new patients unless you have family there already, you are always welcome to call and ask)       [facility] [phone]       [facility]ians/Family Doctors:   [facility]: [phone]  [facility]: [phone]  [facility]: [phone]  [facility]:   [facility]: [phone]   [facility]: [phone]  [facility]:  [facility]: [phone]    Home Blood Pressure Monitoring for Patients   Your provider has recommended that you check your blood pressure (BP) at least once a week at home. If you do not have a blood pressure cuff at home, one will be provided for you. Contact your provider if you have not received your monitor within 1 week.   Helpful Tips for Accurate Home Blood Pressure Checks  . Don't smoke, exercise, or drink caffeine 30 minutes before checking your BP . Use the restroom before checking your BP (a full bladder can raise your pressure) . Relax in a comfortable upright chair . Feet on the ground . Left arm resting comfortably on a flat surface at the level of your heart . Legs uncrossed . Back supported . Sit quietly and don't talk . Place the cuff on your bare arm . Adjust  snuggly, so that only two fingertips can fit between your skin and the top of the cuff . Check 2 readings separated by at least one minute . Keep a log of your BP readings . For a visual, please reference this diagram: http://ccnc.care/bpdiagram  Provider Name: [facility]     Phone: [phone]  Zone 1: ALL CLEAR  Continue to monitor your symptoms:  . BP reading is less than 140 (top number) or less than 90 (bottom number)  . No right upper stomach pain . No headaches or seeing spots . No feeling nauseated or throwing up . No swelling in face and hands  Zone 2: CAUTION Call your doctor's office for any of the following:  . BP reading is greater than 140 (top number) or greater than 90 (bottom number)  . Stomach pain under your ribs in the middle or right side . Headaches or seeing spots . Feeling nauseated or throwing up . Swelling in face and hands  Zone 3: EMERGENCY  Seek immediate medical care if you have any of the following:  . BP reading is greater than160 (top number) or greater than 110 (bottom number) . Severe headaches not improving with Tylenol . Serious difficulty catching your breath . Any worsening symptoms from Zone 2      Second Trimester of Pregnancy The second trimester is from week 14 through week 27 (months 4 through 6). The second trimester is often a time when you  feel your best. Your body has adjusted to being pregnant, and you begin to feel better physically. Usually, morning sickness has lessened or quit completely, you may have more energy, and you may have an increase in appetite. The second trimester is also a time when the fetus is growing rapidly. At the end of the sixth month, the fetus is about 9 inches long and weighs about 1 pounds. You will likely begin to feel the baby move (quickening) between 16 and 20 weeks of pregnancy. Body changes during your second trimester Your body continues to go through many changes during your second  trimester. The changes vary from woman to woman.  Your weight will continue to increase. You will notice your lower abdomen bulging out.  You may begin to get stretch marks on your hips, abdomen, and breasts.  You may develop headaches that can be relieved by medicines. The medicines should be approved by your health care provider.  You may urinate more often because the fetus is pressing on your bladder.  You may develop or continue to have heartburn as a result of your pregnancy.  You may develop constipation because certain hormones are causing the muscles that push waste through your intestines to slow down.  You may develop hemorrhoids or swollen, bulging veins (varicose veins).  You may have back pain. This is caused by: ? Weight gain. ? Pregnancy hormones that are relaxing the joints in your pelvis. ? A shift in weight and the muscles that support your balance.  Your breasts will continue to grow and they will continue to become tender.  Your gums may bleed and may be sensitive to brushing and flossing.  Dark spots or blotches (chloasma, mask of pregnancy) may develop on your face. This will likely fade after the baby is born.  A dark line from your belly button to the pubic area (linea nigra) may appear. This will likely fade after the baby is born.  You may have changes in your hair. These can include thickening of your hair, rapid growth, and changes in texture. Some women also have hair loss during or after pregnancy, or hair that feels dry or thin. Your hair will most likely return to normal after your baby is born.  What to expect at prenatal visits During a routine prenatal visit:  You will be weighed to make sure you and the fetus are growing normally.  Your blood pressure will be taken.  Your abdomen will be measured to track your baby's growth.  The fetal heartbeat will be listened to.  Any test results from the previous visit will be discussed.  Your  health care provider may ask you:  How you are feeling.  If you are feeling the baby move.  If you have had any abnormal symptoms, such as leaking fluid, bleeding, severe headaches, or abdominal cramping.  If you are using any tobacco products, including cigarettes, chewing tobacco, and electronic cigarettes.  If you have any questions.  Other tests that may be performed during your second trimester include:  Blood tests that check for: ? Low iron levels (anemia). ? High blood sugar that affects pregnant women (gestational diabetes) between 75 and 28 weeks. ? Rh antibodies. This is to check for a protein on red blood cells (Rh factor).  Urine tests to check for infections, diabetes, or protein in the urine.  An ultrasound to confirm the proper growth and development of the baby.  An amniocentesis to check for possible genetic problems.  Fetal screens for spina bifida and Down syndrome.  HIV (human immunodeficiency virus) testing. Routine prenatal testing includes screening for HIV, unless you choose not to have this test.  Follow these instructions at home: Medicines  Follow your health care provider's instructions regarding medicine use. Specific medicines may be either safe or unsafe to take during pregnancy.  Take a prenatal vitamin that contains at least 600 micrograms (mcg) of folic acid.  If you develop constipation, try taking a stool softener if your health care provider approves. Eating and drinking  Eat a balanced diet that includes fresh fruits and vegetables, whole grains, good sources of protein such as meat, eggs, or tofu, and low-fat dairy. Your health care provider will help you determine the amount of weight gain that is right for you.  Avoid raw meat and uncooked cheese. These carry germs that can cause birth defects in the baby.  If you have low calcium intake from food, talk to your health care provider about whether you should take a daily calcium  supplement.  Limit foods that are high in fat and processed sugars, such as fried and sweet foods.  To prevent constipation: ? Drink enough fluid to keep your urine clear or pale yellow. ? Eat foods that are high in fiber, such as fresh fruits and vegetables, whole grains, and beans. Activity  Exercise only as directed by your health care provider. Most women can continue their usual exercise routine during pregnancy. Try to exercise for 30 minutes at least 5 days a week. Stop exercising if you experience uterine contractions.  Avoid heavy lifting, wear low heel shoes, and practice good posture.  A sexual relationship may be continued unless your health care provider directs you otherwise. Relieving pain and discomfort  Wear a good support bra to prevent discomfort from breast tenderness.  Take warm sitz baths to soothe any pain or discomfort caused by hemorrhoids. Use hemorrhoid cream if your health care provider approves.  Rest with your legs elevated if you have leg cramps or low back pain.  If you develop varicose veins, wear support hose. Elevate your feet for 15 minutes, 3-4 times a day. Limit salt in your diet. Prenatal Care  Write down your questions. Take them to your prenatal visits.  Keep all your prenatal visits as told by your health care provider. This is important. Safety  Wear your seat belt at all times when driving.  Make a list of emergency phone numbers, including numbers for family, friends, the hospital, and police and fire departments. General instructions  Ask your health care provider for a referral to a local prenatal education class. Begin classes no later than the beginning of month 6 of your pregnancy.  Ask for help if you have counseling or nutritional needs during pregnancy. Your health care provider can offer advice or refer you to specialists for help with various needs.  Do not use hot tubs, steam rooms, or saunas.  Do not douche or use  tampons or scented sanitary pads.  Do not cross your legs for long periods of time.  Avoid cat litter boxes and soil used by cats. These carry germs that can cause birth defects in the baby and possibly loss of the fetus by miscarriage or stillbirth.  Avoid all smoking, herbs, alcohol, and unprescribed drugs. Chemicals in these products can affect the formation and growth of the baby.  Do not use any products that contain nicotine or tobacco, such as cigarettes and e-cigarettes. If you need  help quitting, ask your health care provider.  Visit your dentist if you have not gone yet during your pregnancy. Use a soft toothbrush to brush your teeth and be gentle when you floss. Contact a health care provider if:  You have dizziness.  You have mild pelvic cramps, pelvic pressure, or nagging pain in the abdominal area.  You have persistent nausea, vomiting, or diarrhea.  You have a bad smelling vaginal discharge.  You have pain when you urinate. Get help right away if:  You have a fever.  You are leaking fluid from your vagina.  You have spotting or bleeding from your vagina.  You have severe abdominal cramping or pain.  You have rapid weight gain or weight loss.  You have shortness of breath with chest pain.  You notice sudden or extreme swelling of your face, hands, ankles, feet, or legs.  You have not felt your baby move in over an hour.  You have severe headaches that do not go away when you take medicine.  You have vision changes. Summary  The second trimester is from week 14 through week 27 (months 4 through 6). It is also a time when the fetus is growing rapidly.  Your body goes through many changes during pregnancy. The changes vary from woman to woman.  Avoid all smoking, herbs, alcohol, and unprescribed drugs. These chemicals affect the formation and growth your baby.  Do not use any tobacco products, such as cigarettes, chewing tobacco, and e-cigarettes. If you  need help quitting, ask your health care provider.  Contact your health care provider if you have any questions. Keep all prenatal visits as told by your health care provider. This is important. This information is not intended to replace advice given to you by your health care provider. Make sure you discuss any questions you have with your health care provider. Document Released: 12/27/2000 Document Revised: 06/10/2015 Document Reviewed: 03/05/2012 Elsevier Interactive Patient Education  2017 [employer].

## 2018-06-12 NOTE — Progress Notes (Signed)
[place] 18+3 wks,cephalic,anterior placenta gr 0,cx 2.8 cm,svp of fluid 6 cm,normal ovaries bilat,fhr 152 bpm,efw 262 g 71%,anatomy complete,no obvious abnormalities

## 2018-06-12 NOTE — Progress Notes (Signed)
Makena given SQ in right arm. Patient tolerated well. Next dose in 1 week.

## 2018-06-12 NOTE — Progress Notes (Signed)
   LOW-RISK PREGNANCY VISIT Patient name: Kelly Villarreal MRN [id number]  Date of birth: 01-Oct-1990 Chief Complaint:   Routine Prenatal Visit ([place], 2nd IT)  History of Present Illness:   Kelly Villarreal is a 28 y.o. G74P0100 female at [redacted]w[redacted]d with an Estimated Date of Delivery: 11/10/18 being seen today for ongoing management of a low-risk pregnancy.  Today she reports no complaints. Contractions: Not present. Vag. Bleeding: None.  Movement: Present. denies leaking of fluid. Review of Systems:   Pertinent items are noted in HPI Denies abnormal vaginal discharge w/ itching/odor/irritation, headaches, visual changes, shortness of breath, chest pain, abdominal pain, severe nausea/vomiting, or problems with urination or bowel movements unless otherwise stated above. Pertinent History Reviewed:  Reviewed past medical,surgical, social, obstetrical and family history.  Reviewed problem list, medications and allergies. Physical Assessment:   Vitals:   06/12/18 1110  BP: (!) 103/57  Pulse: 83  Weight: 119 lb (54 kg)  Body mass index is 19.21 kg/m.        Physical Examination:   General appearance: Well appearing, and in no distress  Mental status: Alert, oriented to person, place, and time  Skin: Warm & dry  Cardiovascular: Normal heart rate noted  Respiratory: Normal respiratory effort, no distress  Abdomen: Soft, gravid, nontender  Pelvic: Cervical exam deferred         Extremities: Edema: None  Fetal Status:     Movement: Present     TODAY'S [place] 18+3 wks,cephalic,anterior placenta gr 0,cx 2.8 cm,svp of fluid 6 cm,normal ovaries bilat,fhr 152 bpm,efw 262 g 71%,anatomy complete,no obvious abnormalities   Results for orders placed or performed in visit on 06/12/18 (from the past 24 hour(s))  POC Urinalysis Dipstick OB   Collection Time: 06/12/18 11:28 AM  Result Value Ref Range   Color, UA     Clarity, UA     Glucose, UA Negative Negative   Bilirubin, UA     Ketones, UA neg    Spec  Grav, UA     Blood, UA neg    pH, UA     POC,PROTEIN,UA Negative Negative, Trace, Small (1+), Moderate (2+), Large (3+), 4+   Urobilinogen, UA     Nitrite, UA neg    Leukocytes, UA Negative Negative   Appearance     Odor      Assessment & Plan:  1) Low-risk pregnancy G2P0100 at [redacted]w[redacted]d with an Estimated Date of Delivery: 11/10/18   2) H/O 24.5wk PTB d/t PPROM, CL 2.8cm today, will repeat in 2wks, on Makena  3) Prev classical c/s> for RCS   Meds: No orders of the defined types were placed in this encounter.  Labs/procedures today: 2nd IT, anatomy u/s, Inheritest, Makena  Plan:  Continue routine obstetrical care   Reviewed: Preterm labor symptoms and general obstetric precautions including but not limited to vaginal bleeding, contractions, leaking of fluid and fetal movement were reviewed in detail with the patient.  All questions were answered  Follow-up: Return for Weekly 17P; 2wks cervical length u/s and LROB.  Orders Placed This Encounter  Procedures  . US OB Limited  . INTEGRATED 2  . Inheritest Core(CF97,SMA,FraX)  . POC Urinalysis Dipstick OB   Cheral Marker CNM, [facility] 06/12/2018 11:48 AM

## 2018-06-13 LAB — INTEGRATED 2

## 2018-06-13 LAB — INHERITEST CORE(CF97,SMA,FRAX)

## 2018-06-14 LAB — INTEGRATED 2

## 2018-06-15 LAB — INTEGRATED 2
AFP MoM: 1.05
Alpha-Fetoprotein: 61.2 ng/mL
Crown Rump Length: 80.3 mm
DIA MoM: 0.76
DIA Value: 159.1 pg/mL
Estriol, Unconjugated: 2.06 ng/mL
Gest. Age on Collection Date: 13.7 weeks
Gestational Age: 18.9 weeks
Maternal Age at EDD: 29.4 yr
Nuchal Translucency (NT): 1.7 mm
Nuchal Translucency MoM: 0.9
Number of Fetuses: 1
PAPP-A MoM: 1.5
PAPP-A Value: 3212.4 ng/mL
Test Results:: NEGATIVE
Weight: 113 [lb_av]
Weight: 113 [lb_av]
hCG MoM: 1.88
hCG Value: 51.7 IU/mL
uE3 MoM: 1.18

## 2018-06-19 ENCOUNTER — Encounter: Payer: Self-pay | Admitting: *Deleted

## 2018-06-19 ENCOUNTER — Ambulatory Visit: Payer: Medicaid Other | Admitting: *Deleted

## 2018-06-19 ENCOUNTER — Other Ambulatory Visit: Payer: Self-pay

## 2018-06-19 VITALS — BP 99/65 | HR 101 | Wt 124.0 lb

## 2018-06-19 DIAGNOSIS — Z8751 Personal history of pre-term labor: Secondary | ICD-10-CM

## 2018-06-19 MED ORDER — HYDROXYPROGESTERONE CAPROATE 275 MG/1.1ML ~~LOC~~ SOAJ
275.0000 mg | Freq: Once | SUBCUTANEOUS | Status: AC
  Administered 2018-06-19: 275 mg via SUBCUTANEOUS

## 2018-06-19 NOTE — Progress Notes (Signed)
17P given sq in left arm

## 2018-06-25 ENCOUNTER — Encounter: Payer: Medicaid Other | Admitting: Obstetrics & Gynecology

## 2018-06-25 ENCOUNTER — Other Ambulatory Visit: Payer: Medicaid Other

## 2018-06-27 ENCOUNTER — Encounter: Payer: Self-pay | Admitting: *Deleted

## 2018-06-27 LAB — INHERITEST CORE(CF97,SMA,FRAX)

## 2018-06-28 ENCOUNTER — Ambulatory Visit (INDEPENDENT_AMBULATORY_CARE_PROVIDER_SITE_OTHER): Payer: Medicaid Other | Admitting: Obstetrics & Gynecology

## 2018-06-28 ENCOUNTER — Encounter: Payer: Self-pay | Admitting: Obstetrics & Gynecology

## 2018-06-28 ENCOUNTER — Ambulatory Visit (INDEPENDENT_AMBULATORY_CARE_PROVIDER_SITE_OTHER): Payer: Medicaid Other

## 2018-06-28 ENCOUNTER — Other Ambulatory Visit: Payer: Self-pay | Admitting: Women's Health

## 2018-06-28 ENCOUNTER — Other Ambulatory Visit: Payer: Self-pay

## 2018-06-28 VITALS — Wt 127.4 lb

## 2018-06-28 DIAGNOSIS — O09892 Supervision of other high risk pregnancies, second trimester: Secondary | ICD-10-CM

## 2018-06-28 DIAGNOSIS — O09212 Supervision of pregnancy with history of pre-term labor, second trimester: Secondary | ICD-10-CM

## 2018-06-28 DIAGNOSIS — Z3482 Encounter for supervision of other normal pregnancy, second trimester: Secondary | ICD-10-CM

## 2018-06-28 DIAGNOSIS — Z3A20 20 weeks gestation of pregnancy: Secondary | ICD-10-CM

## 2018-06-28 DIAGNOSIS — Z331 Pregnant state, incidental: Secondary | ICD-10-CM

## 2018-06-28 DIAGNOSIS — Z98891 History of uterine scar from previous surgery: Secondary | ICD-10-CM

## 2018-06-28 DIAGNOSIS — Z1389 Encounter for screening for other disorder: Secondary | ICD-10-CM

## 2018-06-28 LAB — POCT URINALYSIS DIPSTICK OB
Blood, UA: NEGATIVE
Glucose, UA: NEGATIVE
Ketones, UA: NEGATIVE
Leukocytes, UA: NEGATIVE
Nitrite, UA: NEGATIVE

## 2018-06-28 MED ORDER — HYDROXYPROGESTERONE CAPROATE 275 MG/1.1ML ~~LOC~~ SOAJ
275.0000 mg | Freq: Once | SUBCUTANEOUS | Status: AC
  Administered 2018-06-28: 275 mg via SUBCUTANEOUS

## 2018-06-28 NOTE — Progress Notes (Signed)
[place] TA/TV 02+7 wks,cephalic,cx 7.4-1.2 cm with a and w/o pressure,normal ovaries bilat,anterior placenta gr 0,svp 6.9 cm,fhr 146 bpm

## 2018-06-28 NOTE — Progress Notes (Signed)
   LOW-RISK PREGNANCY VISIT Patient name: Kelly Villarreal MRN [id number]  Date of birth: 1990/07/11 Chief Complaint:   Routine Prenatal Visit (U/S- 17p)  History of Present Illness:   Kelly Villarreal is a 28 y.o. G58P0100 female at [redacted]w[redacted]d with an Estimated Date of Delivery: 11/10/18 being seen today for ongoing management of a low-risk pregnancy.  Today she reports no complaints. Contractions: Not present.  .  Movement: Present. denies leaking of fluid. Review of Systems:   Pertinent items are noted in HPI Denies abnormal vaginal discharge w/ itching/odor/irritation, headaches, visual changes, shortness of breath, chest pain, abdominal pain, severe nausea/vomiting, or problems with urination or bowel movements unless otherwise stated above. Pertinent History Reviewed:  Reviewed past medical,surgical, social, obstetrical and family history.  Reviewed problem list, medications and allergies. Physical Assessment:   Vitals:   06/28/18 1233  Weight: 127 lb 6.4 oz (57.8 kg)  Body mass index is 20.56 kg/m.        Physical Examination:   General appearance: Well appearing, and in no distress  Mental status: Alert, oriented to person, place, and time  Skin: Warm & dry  Cardiovascular: Normal heart rate noted  Respiratory: Normal respiratory effort, no distress  Abdomen: Soft, gravid, nontender  Pelvic: Cervical exam deferred         Extremities: Edema: None  Fetal Status:     Movement: Present    Results for orders placed or performed in visit on 06/28/18 (from the past 24 hour(s))  POC Urinalysis Dipstick OB   Collection Time: 06/28/18 12:35 PM  Result Value Ref Range   Color, UA     Clarity, UA     Glucose, UA Negative Negative   Bilirubin, UA     Ketones, UA neg    Spec Grav, UA     Blood, UA neg    pH, UA     POC,PROTEIN,UA Trace Negative, Trace, Small (1+), Moderate (2+), Large (3+), 4+   Urobilinogen, UA     Nitrite, UA neg    Leukocytes, UA Negative Negative   Appearance      Odor      Assessment & Plan:  1) Low-risk pregnancy G2P0100 at [redacted]w[redacted]d with an Estimated Date of Delivery: 11/10/18   2) Hx of [redacted]w[redacted]d PPROM, cervical length 4 cm, Hx of classical c section will be scheduled at 36-37 weeks for repeat   Meds:  Meds ordered this encounter  Medications  . HYDROXYprogesterone Caproate SOAJ 275 mg   Labs/procedures today: sonogram  Plan:  Continue routine obstetrical care   Reviewed:  labor symptoms and general obstetric precautions including but not limited to vaginal bleeding, contractions, leaking of fluid and fetal movement were reviewed in detail with the patient.  All questions were answered  Follow-up: Return in about 2 weeks (around 07/12/2018) for sonogram for cervical length, LROB.  Orders Placed This Encounter  Procedures  . US OB Limited  . POC Urinalysis Dipstick OB   Florian Buff  06/28/2018 1:39 PM

## 2018-07-05 ENCOUNTER — Other Ambulatory Visit: Payer: Self-pay

## 2018-07-05 ENCOUNTER — Ambulatory Visit (INDEPENDENT_AMBULATORY_CARE_PROVIDER_SITE_OTHER): Payer: Medicaid Other | Admitting: *Deleted

## 2018-07-05 DIAGNOSIS — Z3A22 22 weeks gestation of pregnancy: Secondary | ICD-10-CM | POA: Diagnosis not present

## 2018-07-05 DIAGNOSIS — Z3482 Encounter for supervision of other normal pregnancy, second trimester: Secondary | ICD-10-CM

## 2018-07-05 DIAGNOSIS — Z8751 Personal history of pre-term labor: Secondary | ICD-10-CM

## 2018-07-05 DIAGNOSIS — O09212 Supervision of pregnancy with history of pre-term labor, second trimester: Secondary | ICD-10-CM | POA: Diagnosis not present

## 2018-07-05 MED ORDER — HYDROXYPROGESTERONE CAPROATE 275 MG/1.1ML ~~LOC~~ SOAJ
275.0000 mg | Freq: Once | SUBCUTANEOUS | Status: AC
  Administered 2018-07-05: 275 mg via SUBCUTANEOUS

## 2018-07-05 NOTE — Progress Notes (Signed)
17p given in left arm with no complications.  Noted knot and slight edema to injection site on right arm from last week.  Advised to apply some ice to the area to see if that would help.  Also advised to let us know if left arm has any issues.  Verbalized understanding.

## 2018-07-11 ENCOUNTER — Other Ambulatory Visit: Payer: Self-pay | Admitting: Obstetrics & Gynecology

## 2018-07-11 DIAGNOSIS — O09892 Supervision of other high risk pregnancies, second trimester: Secondary | ICD-10-CM

## 2018-07-12 ENCOUNTER — Encounter: Payer: Self-pay | Admitting: Women's Health

## 2018-07-12 ENCOUNTER — Ambulatory Visit (INDEPENDENT_AMBULATORY_CARE_PROVIDER_SITE_OTHER): Payer: Medicaid Other

## 2018-07-12 ENCOUNTER — Ambulatory Visit (INDEPENDENT_AMBULATORY_CARE_PROVIDER_SITE_OTHER): Payer: Medicaid Other | Admitting: Women's Health

## 2018-07-12 ENCOUNTER — Other Ambulatory Visit: Payer: Self-pay

## 2018-07-12 VITALS — BP 113/64 | HR 111 | Wt 132.4 lb

## 2018-07-12 DIAGNOSIS — Z1389 Encounter for screening for other disorder: Secondary | ICD-10-CM

## 2018-07-12 DIAGNOSIS — O09212 Supervision of pregnancy with history of pre-term labor, second trimester: Secondary | ICD-10-CM

## 2018-07-12 DIAGNOSIS — Z3A24 24 weeks gestation of pregnancy: Secondary | ICD-10-CM

## 2018-07-12 DIAGNOSIS — Z3A22 22 weeks gestation of pregnancy: Secondary | ICD-10-CM

## 2018-07-12 DIAGNOSIS — O09892 Supervision of other high risk pregnancies, second trimester: Secondary | ICD-10-CM

## 2018-07-12 DIAGNOSIS — Z3482 Encounter for supervision of other normal pregnancy, second trimester: Secondary | ICD-10-CM

## 2018-07-12 DIAGNOSIS — Z8751 Personal history of pre-term labor: Secondary | ICD-10-CM

## 2018-07-12 DIAGNOSIS — Z331 Pregnant state, incidental: Secondary | ICD-10-CM

## 2018-07-12 LAB — POCT URINALYSIS DIPSTICK OB
Blood, UA: NEGATIVE
Glucose, UA: NEGATIVE
Ketones, UA: NEGATIVE
Leukocytes, UA: NEGATIVE
Nitrite, UA: NEGATIVE
POC,PROTEIN,UA: NEGATIVE

## 2018-07-12 MED ORDER — HYDROXYPROGESTERONE CAPROATE 275 MG/1.1ML ~~LOC~~ SOAJ
275.0000 mg | Freq: Once | SUBCUTANEOUS | Status: AC
  Administered 2018-07-12: 12:00:00 275 mg via SUBCUTANEOUS

## 2018-07-12 NOTE — Progress Notes (Signed)
17P given in right arm with no complications. Pt to return next week for next injection.

## 2018-07-12 NOTE — Progress Notes (Signed)
[place] CN/OB:09+6 wks,cephalic,anterior placenta gr 0,normal ovaries bilat, cx length w/o pressure 4 cm,with pressure 3.7 cm,svp of fluid 4.3 cm,fhr 148 bpm

## 2018-07-12 NOTE — Progress Notes (Signed)
   LOW-RISK PREGNANCY VISIT Patient name: Kelly Villarreal MRN [id number]  Date of birth: Dec 02, 1990 Chief Complaint:   Routine Prenatal Visit (U/S)  History of Present Illness:   Kelly Villarreal is a 28 y.o. G65P0100 female at [redacted]w[redacted]d with an Estimated Date of Delivery: 11/10/18 being seen today for ongoing management of a low-risk pregnancy.  Today she reports no complaints. Had knot on Rt arm after 1st Makena, didn't have any problems w/ Lt arm last week. Getting nervous/anxious as time is coming up when she had last baby. Contractions: Not present.  .  Movement: Present. denies leaking of fluid. Review of Systems:   Pertinent items are noted in HPI Denies abnormal vaginal discharge w/ itching/odor/irritation, headaches, visual changes, shortness of breath, chest pain, abdominal pain, severe nausea/vomiting, or problems with urination or bowel movements unless otherwise stated above. Pertinent History Reviewed:  Reviewed past medical,surgical, social, obstetrical and family history.  Reviewed problem list, medications and allergies. Physical Assessment:   Vitals:   07/12/18 1126  BP: 113/64  Pulse: (!) 111  Weight: 132 lb 6.4 oz (60.1 kg)  Body mass index is 21.37 kg/m.        Physical Examination:   General appearance: Well appearing, and in no distress  Mental status: Alert, oriented to person, place, and time  Skin: Warm & dry  Cardiovascular: Normal heart rate noted  Respiratory: Normal respiratory effort, no distress  Abdomen: Soft, gravid, nontender  Pelvic: Cervical exam deferred         Extremities: Edema: None  Fetal Status: Fetal Heart Rate (bpm): 148 u/s   Movement: Present    [place] BW/IO:03+5 wks,cephalic,anterior placenta gr 0,normal ovaries bilat, cx length w/o pressure 4 cm,with pressure 3.7 cm,svp of fluid 4.3 cm,fhr 148 bpm  Results for orders placed or performed in visit on 07/12/18 (from the past 24 hour(s))  POC Urinalysis Dipstick OB   Collection Time: 07/12/18  11:29 AM  Result Value Ref Range   Color, UA     Clarity, UA     Glucose, UA Negative Negative   Bilirubin, UA     Ketones, UA neg    Spec Grav, UA     Blood, UA neg    pH, UA     POC,PROTEIN,UA Negative Negative, Trace, Small (1+), Moderate (2+), Large (3+), 4+   Urobilinogen, UA     Nitrite, UA neg    Leukocytes, UA Negative Negative   Appearance     Odor      Assessment & Plan:  1) Low-risk pregnancy G2P0100 at 107w5d with an Estimated Date of Delivery: 11/10/18   2) H/O 24.5wk PTB, d/t PPROM w/ ND, continue Makena weekly, CL u/s once more at 24wks. Reviewed ptl s/s, reasons to seek care  3) Prev classical c/s> will need RCS @ 36-37wks    Meds: No orders of the defined types were placed in this encounter.  Labs/procedures today: u/s, Makena  Plan:  Continue routine obstetrical care   Reviewed: Preterm labor symptoms and general obstetric precautions including but not limited to vaginal bleeding, contractions, leaking of fluid and fetal movement were reviewed in detail with the patient.  All questions were answered.   Follow-up: Return in about 2 weeks (around 07/26/2018) for LROB in person, US:OB F/U cervix, Weekly 17P.  Orders Placed This Encounter  Procedures  . POC Urinalysis Dipstick OB   Roma Schanz CNM, [facility] 07/12/2018 12:14 PM

## 2018-07-12 NOTE — Patient Instructions (Addendum)
Kelly Villarreal, I greatly value your feedback.  If you receive a survey following your visit with us today, we appreciate you taking the time to fill it out.  Thanks, [name] , CNM, [facility] LP[facility]NP-BC  [facility] is now [facility] ([address] [place], [place] [address]) Entrance located off of [address] Free 24/7 valet parking     Second Trimester of Pregnancy The second trimester is from week 14 through week 27 (months 4 through 6). The second trimester is often a time when you feel your best. Your body has adjusted to being pregnant, and you begin to feel better physically. Usually, morning sickness has lessened or quit completely, you may have more energy, and you may have an increase in appetite. The second trimester is also a time when the fetus is growing rapidly. At the end of the sixth month, the fetus is about 9 inches long and weighs about 1 pounds. You will likely begin to feel the baby move (quickening) between 16 and 20 weeks of pregnancy. Body changes during your second trimester Your body continues to go through many changes during your second trimester. The changes vary from woman to woman.  Your weight will continue to increase. You will notice your lower abdomen bulging out.  You may begin to get stretch marks on your hips, abdomen, and breasts.  You may develop headaches that can be relieved by medicines. The medicines should be approved by your health care provider.  You may urinate more often because the fetus is pressing on your bladder.  You may develop or continue to have heartburn as a result of your pregnancy.  You may develop constipation because certain hormones are causing the muscles that push waste through your intestines to slow down.  You may develop hemorrhoids or swollen, bulging veins (varicose veins).  You may have back pain. This is caused by: ? Weight gain. ? Pregnancy hormones that are relaxing  the joints in your pelvis. ? A shift in weight and the muscles that support your balance.  Your breasts will continue to grow and they will continue to become tender.  Your gums may bleed and may be sensitive to brushing and flossing.  Dark spots or blotches (chloasma, mask of pregnancy) may develop on your face. This will likely fade after the baby is born.  A dark line from your belly button to the pubic area (linea nigra) may appear. This will likely fade after the baby is born.  You may have changes in your hair. These can include thickening of your hair, rapid growth, and changes in texture. Some women also have hair loss during or after pregnancy, or hair that feels dry or thin. Your hair will most likely return to normal after your baby is born.  What to expect at prenatal visits During a routine prenatal visit:  You will be weighed to make sure you and the fetus are growing normally.  Your blood pressure will be taken.  Your abdomen will be measured to track your baby's growth.  The fetal heartbeat will be listened to.  Any test results from the previous visit will be discussed.  Your health care provider may ask you:  How you are feeling.  If you are feeling the baby move.  If you have had any abnormal symptoms, such as leaking fluid, bleeding, severe headaches, or abdominal cramping.  If you are using any tobacco products, including cigarettes, chewing  tobacco, and electronic cigarettes.  If you have any questions.  Other tests that may be performed during your second trimester include:  Blood tests that check for: ? Low iron levels (anemia). ? High blood sugar that affects pregnant women (gestational diabetes) between 27 and 28 weeks. ? Rh antibodies. This is to check for a protein on red blood cells (Rh factor).  Urine tests to check for infections, diabetes, or protein in the urine.  An ultrasound to confirm the proper growth and development of the baby.   An amniocentesis to check for possible genetic problems.  Fetal screens for spina bifida and Down syndrome.  HIV (human immunodeficiency virus) testing. Routine prenatal testing includes screening for HIV, unless you choose not to have this test.  Follow these instructions at home: Medicines  Follow your health care provider's instructions regarding medicine use. Specific medicines may be either safe or unsafe to take during pregnancy.  Take a prenatal vitamin that contains at least 600 micrograms (mcg) of folic acid.  If you develop constipation, try taking a stool softener if your health care provider approves. Eating and drinking  Eat a balanced diet that includes fresh fruits and vegetables, whole grains, good sources of protein such as meat, eggs, or tofu, and low-fat dairy. Your health care provider will help you determine the amount of weight gain that is right for you.  Avoid raw meat and uncooked cheese. These carry germs that can cause birth defects in the baby.  If you have low calcium intake from food, talk to your health care provider about whether you should take a daily calcium supplement.  Limit foods that are high in fat and processed sugars, such as fried and sweet foods.  To prevent constipation: ? Drink enough fluid to keep your urine clear or pale yellow. ? Eat foods that are high in fiber, such as fresh fruits and vegetables, whole grains, and beans. Activity  Exercise only as directed by your health care provider. Most women can continue their usual exercise routine during pregnancy. Try to exercise for 30 minutes at least 5 days a week. Stop exercising if you experience uterine contractions.  Avoid heavy lifting, wear low heel shoes, and practice good posture.  A sexual relationship may be continued unless your health care provider directs you otherwise. Relieving pain and discomfort  Wear a good support bra to prevent discomfort from breast tenderness.   Take warm sitz baths to soothe any pain or discomfort caused by hemorrhoids. Use hemorrhoid cream if your health care provider approves.  Rest with your legs elevated if you have leg cramps or low back pain.  If you develop varicose veins, wear support hose. Elevate your feet for 15 minutes, 3-4 times a day. Limit salt in your diet. Prenatal Care  Write down your questions. Take them to your prenatal visits.  Keep all your prenatal visits as told by your health care provider. This is important. Safety  Wear your seat belt at all times when driving.  Make a list of emergency phone numbers, including numbers for family, friends, the hospital, and police and fire departments. General instructions  Ask your health care provider for a referral to a local prenatal education class. Begin classes no later than the beginning of month 6 of your pregnancy.  Ask for help if you have counseling or nutritional needs during pregnancy. Your health care provider can offer advice or refer you to specialists for help with various needs.  Do not use  hot tubs, steam rooms, or saunas.  Do not douche or use tampons or scented sanitary pads.  Do not cross your legs for long periods of time.  Avoid cat litter boxes and soil used by cats. These carry germs that can cause birth defects in the baby and possibly loss of the fetus by miscarriage or stillbirth.  Avoid all smoking, herbs, alcohol, and unprescribed drugs. Chemicals in these products can affect the formation and growth of the baby.  Do not use any products that contain nicotine or tobacco, such as cigarettes and e-cigarettes. If you need help quitting, ask your health care provider.  Visit your dentist if you have not gone yet during your pregnancy. Use a soft toothbrush to brush your teeth and be gentle when you floss. Contact a health care provider if:  You have dizziness.  You have mild pelvic cramps, pelvic pressure, or nagging pain in the  abdominal area.  You have persistent nausea, vomiting, or diarrhea.  You have a bad smelling vaginal discharge.  You have pain when you urinate. Get help right away if:  You have a fever.  You are leaking fluid from your vagina.  You have spotting or bleeding from your vagina.  You have severe abdominal cramping or pain.  You have rapid weight gain or weight loss.  You have shortness of breath with chest pain.  You notice sudden or extreme swelling of your face, hands, ankles, feet, or legs.  You have not felt your baby move in over an hour.  You have severe headaches that do not go away when you take medicine.  You have vision changes. Summary  The second trimester is from week 14 through week 27 (months 4 through 6). It is also a time when the fetus is growing rapidly.  Your body goes through many changes during pregnancy. The changes vary from woman to woman.  Avoid all smoking, herbs, alcohol, and unprescribed drugs. These chemicals affect the formation and growth your baby.  Do not use any tobacco products, such as cigarettes, chewing tobacco, and e-cigarettes. If you need help quitting, ask your health care provider.  Contact your health care provider if you have any questions. Keep all prenatal visits as told by your health care provider. This is important. This information is not intended to replace advice given to you by your health care provider. Make sure you discuss any questions you have with your health care provider. Document Released: 12/27/2000 Document Revised: 06/10/2015 Document Reviewed: 03/05/2012 Elsevier Interactive Patient Education  2017 [employer].

## 2018-07-18 ENCOUNTER — Other Ambulatory Visit: Payer: Self-pay

## 2018-07-18 ENCOUNTER — Ambulatory Visit: Payer: Medicaid Other

## 2018-07-18 VITALS — BP 113/69 | HR 105 | Ht 66.0 in | Wt 134.0 lb

## 2018-07-18 DIAGNOSIS — Z1389 Encounter for screening for other disorder: Secondary | ICD-10-CM

## 2018-07-18 DIAGNOSIS — O09892 Supervision of other high risk pregnancies, second trimester: Secondary | ICD-10-CM

## 2018-07-18 DIAGNOSIS — Z331 Pregnant state, incidental: Secondary | ICD-10-CM

## 2018-07-18 LAB — POCT URINALYSIS DIPSTICK OB
Blood, UA: NEGATIVE
Glucose, UA: NEGATIVE
Ketones, UA: NEGATIVE
Leukocytes, UA: NEGATIVE
Nitrite, UA: NEGATIVE
POC,PROTEIN,UA: NEGATIVE

## 2018-07-18 MED ORDER — HYDROXYPROGESTERONE CAPROATE 275 MG/1.1ML ~~LOC~~ SOAJ
275.0000 mg | Freq: Once | SUBCUTANEOUS | Status: AC
  Administered 2018-07-18: 275 mg via SUBCUTANEOUS

## 2018-07-18 NOTE — Progress Notes (Signed)
Pt here for Makena 275 mg SQ lt arm. Tolerated well. Return 1 week for next injection. Pad CMA

## 2018-07-25 ENCOUNTER — Other Ambulatory Visit: Payer: Self-pay | Admitting: Women's Health

## 2018-07-25 DIAGNOSIS — O09299 Supervision of pregnancy with other poor reproductive or obstetric history, unspecified trimester: Secondary | ICD-10-CM

## 2018-07-26 ENCOUNTER — Other Ambulatory Visit: Payer: Self-pay

## 2018-07-26 ENCOUNTER — Ambulatory Visit (INDEPENDENT_AMBULATORY_CARE_PROVIDER_SITE_OTHER): Payer: Medicaid Other | Admitting: Obstetrics & Gynecology

## 2018-07-26 ENCOUNTER — Ambulatory Visit (INDEPENDENT_AMBULATORY_CARE_PROVIDER_SITE_OTHER): Payer: Medicaid Other

## 2018-07-26 ENCOUNTER — Encounter: Payer: Self-pay | Admitting: Obstetrics & Gynecology

## 2018-07-26 VITALS — BP 94/62 | HR 111 | Wt 137.0 lb

## 2018-07-26 DIAGNOSIS — O09299 Supervision of pregnancy with other poor reproductive or obstetric history, unspecified trimester: Secondary | ICD-10-CM

## 2018-07-26 DIAGNOSIS — Z3A24 24 weeks gestation of pregnancy: Secondary | ICD-10-CM | POA: Diagnosis not present

## 2018-07-26 DIAGNOSIS — Z98891 History of uterine scar from previous surgery: Secondary | ICD-10-CM

## 2018-07-26 DIAGNOSIS — O09292 Supervision of pregnancy with other poor reproductive or obstetric history, second trimester: Secondary | ICD-10-CM | POA: Diagnosis not present

## 2018-07-26 DIAGNOSIS — Z1389 Encounter for screening for other disorder: Secondary | ICD-10-CM

## 2018-07-26 DIAGNOSIS — Z331 Pregnant state, incidental: Secondary | ICD-10-CM

## 2018-07-26 DIAGNOSIS — Z3482 Encounter for supervision of other normal pregnancy, second trimester: Secondary | ICD-10-CM

## 2018-07-26 LAB — POCT URINALYSIS DIPSTICK OB
Blood, UA: NEGATIVE
Glucose, UA: NEGATIVE
Ketones, UA: NEGATIVE
Leukocytes, UA: NEGATIVE
Nitrite, UA: NEGATIVE
POC,PROTEIN,UA: NEGATIVE

## 2018-07-26 MED ORDER — HYDROXYPROGESTERONE CAPROATE 275 MG/1.1ML ~~LOC~~ SOAJ
275.0000 mg | Freq: Once | SUBCUTANEOUS | Status: AC
  Administered 2018-07-26: 275 mg via SUBCUTANEOUS

## 2018-07-26 NOTE — Progress Notes (Signed)
   LOW-RISK PREGNANCY VISIT Patient name: Kelly Villarreal MRN [id number]  Date of birth: [birth year] Chief Complaint:   Routine Prenatal Visit (17p)  History of Present Illness:   Kelly Villarreal is a 28 y.o. G44P0100 female at [redacted]w[redacted]d with an Estimated Date of Delivery: 11/10/18 being seen today for ongoing management of a low-risk pregnancy.  Today she reports no complaints. Contractions: Not present.  .  Movement: Present. denies leaking of fluid. Review of Systems:   Pertinent items are noted in HPI Denies abnormal vaginal discharge w/ itching/odor/irritation, headaches, visual changes, shortness of breath, chest pain, abdominal pain, severe nausea/vomiting, or problems with urination or bowel movements unless otherwise stated above. Pertinent History Reviewed:  Reviewed past medical,surgical, social, obstetrical and family history.  Reviewed problem list, medications and allergies. Physical Assessment:   Vitals:   07/26/18 1119  BP: 94/62  Pulse: (!) 111  Weight: 137 lb (62.1 kg)  Body mass index is 22.11 kg/m.        Physical Examination:   General appearance: Well appearing, and in no distress  Mental status: Alert, oriented to person, place, and time  Skin: Warm & dry  Cardiovascular: Normal heart rate noted  Respiratory: Normal respiratory effort, no distress  Abdomen: Soft, gravid, nontender  Pelvic: Cervical exam deferred         Extremities: Edema: None  Fetal Status: Fetal Heart Rate (bpm): 155 Fundal Height: 26 cm Movement: Present    Results for orders placed or performed in visit on 07/26/18 (from the past 24 hour(s))  POC Urinalysis Dipstick OB   Collection Time: 07/26/18 11:23 AM  Result Value Ref Range   Color, UA     Clarity, UA     Glucose, UA Negative Negative   Bilirubin, UA     Ketones, UA neg    Spec Grav, UA     Blood, UA neg    pH, UA     POC,PROTEIN,UA Negative Negative, Trace, Small (1+), Moderate (2+), Large (3+), 4+   Urobilinogen, UA     Nitrite, UA neg    Leukocytes, UA Negative Negative   Appearance     Odor      Assessment & Plan:  1) Low-risk pregnancy G2P0100 at [redacted]w[redacted]d with an Estimated Date of Delivery: 11/10/18   2) Previous classical C section,    Meds:  Meds ordered this encounter  Medications  . HYDROXYprogesterone Caproate SOAJ 275 mg   Labs/procedures today:   Plan:  Continue routine obstetrical care   Reviewed: Preterm labor symptoms and general obstetric precautions including but not limited to vaginal bleeding, contractions, leaking of fluid and fetal movement were reviewed in detail with the patient.  All questions were answered.  home bp cuff. Rx faxed to . Check bp weekly, let us know if >140/90.   Follow-up: Return in about 3 weeks (around 08/16/2018) for PN2, LROB.  Orders Placed This Encounter  Procedures  . POC Urinalysis Dipstick OB   Mertie Clause Eure 07/26/2018 11:53 AM

## 2018-07-26 NOTE — Progress Notes (Signed)
[place] 24+5 wks,breech,cervical length 4-3.5 cm with and without pressure,fhr 157 bpm,anterior placenta gr 1,svp of fluid 6.6 cm,bilat adnexa wnl

## 2018-08-02 ENCOUNTER — Ambulatory Visit (INDEPENDENT_AMBULATORY_CARE_PROVIDER_SITE_OTHER): Payer: Medicaid Other | Admitting: *Deleted

## 2018-08-02 ENCOUNTER — Other Ambulatory Visit: Payer: Self-pay

## 2018-08-02 VITALS — BP 106/69 | HR 109 | Wt 139.5 lb

## 2018-08-02 DIAGNOSIS — Z3482 Encounter for supervision of other normal pregnancy, second trimester: Secondary | ICD-10-CM

## 2018-08-02 DIAGNOSIS — O09212 Supervision of pregnancy with history of pre-term labor, second trimester: Secondary | ICD-10-CM

## 2018-08-02 DIAGNOSIS — Z8751 Personal history of pre-term labor: Secondary | ICD-10-CM

## 2018-08-02 MED ORDER — HYDROXYPROGESTERONE CAPROATE 275 MG/1.1ML ~~LOC~~ SOAJ
275.0000 mg | Freq: Once | SUBCUTANEOUS | Status: AC
  Administered 2018-08-02: 275 mg via SUBCUTANEOUS

## 2018-08-02 MED ORDER — HYDROXYPROGESTERONE CAPROATE 250 MG/ML IM OIL: 250.0000 mg | TOPICAL_OIL | Freq: Once | INTRAMUSCULAR | Status: DC

## 2018-08-02 NOTE — Progress Notes (Signed)
17P given in left arm with no complications. Pt to return in 1 week for next injection.  

## 2018-08-09 ENCOUNTER — Ambulatory Visit (INDEPENDENT_AMBULATORY_CARE_PROVIDER_SITE_OTHER): Payer: Medicaid Other | Admitting: *Deleted

## 2018-08-09 ENCOUNTER — Other Ambulatory Visit: Payer: Self-pay

## 2018-08-09 ENCOUNTER — Encounter: Payer: Self-pay | Admitting: *Deleted

## 2018-08-09 VITALS — BP 117/66 | HR 102 | Ht 66.0 in | Wt 145.0 lb

## 2018-08-09 DIAGNOSIS — O09212 Supervision of pregnancy with history of pre-term labor, second trimester: Secondary | ICD-10-CM

## 2018-08-09 DIAGNOSIS — O09892 Supervision of other high risk pregnancies, second trimester: Secondary | ICD-10-CM

## 2018-08-09 DIAGNOSIS — Z331 Pregnant state, incidental: Secondary | ICD-10-CM

## 2018-08-09 DIAGNOSIS — Z1389 Encounter for screening for other disorder: Secondary | ICD-10-CM

## 2018-08-09 LAB — POCT URINALYSIS DIPSTICK OB
Blood, UA: NEGATIVE
Glucose, UA: NEGATIVE
Ketones, UA: NEGATIVE
Nitrite, UA: NEGATIVE
POC,PROTEIN,UA: NEGATIVE

## 2018-08-09 MED ORDER — HYDROXYPROGESTERONE CAPROATE 275 MG/1.1ML ~~LOC~~ SOAJ
275.0000 mg | Freq: Once | SUBCUTANEOUS | Status: AC
  Administered 2018-08-09: 275 mg via SUBCUTANEOUS

## 2018-08-09 NOTE — Progress Notes (Signed)
Pt here for 17P. Pt received shot in right arm. Pt tolerated shot well. Return in 1 week for next 17P. [place]

## 2018-08-16 ENCOUNTER — Other Ambulatory Visit: Payer: Medicaid Other

## 2018-08-16 ENCOUNTER — Ambulatory Visit (INDEPENDENT_AMBULATORY_CARE_PROVIDER_SITE_OTHER): Payer: Medicaid Other | Admitting: Obstetrics and Gynecology

## 2018-08-16 ENCOUNTER — Encounter: Payer: Self-pay | Admitting: Obstetrics and Gynecology

## 2018-08-16 ENCOUNTER — Other Ambulatory Visit: Payer: Self-pay

## 2018-08-16 VITALS — BP 126/72 | HR 93 | Wt 147.4 lb

## 2018-08-16 DIAGNOSIS — O09892 Supervision of other high risk pregnancies, second trimester: Secondary | ICD-10-CM

## 2018-08-16 DIAGNOSIS — Z331 Pregnant state, incidental: Secondary | ICD-10-CM

## 2018-08-16 DIAGNOSIS — Z1389 Encounter for screening for other disorder: Secondary | ICD-10-CM

## 2018-08-16 DIAGNOSIS — Z3A27 27 weeks gestation of pregnancy: Secondary | ICD-10-CM

## 2018-08-16 DIAGNOSIS — O09212 Supervision of pregnancy with history of pre-term labor, second trimester: Secondary | ICD-10-CM

## 2018-08-16 DIAGNOSIS — Z23 Encounter for immunization: Secondary | ICD-10-CM

## 2018-08-16 DIAGNOSIS — Z3482 Encounter for supervision of other normal pregnancy, second trimester: Secondary | ICD-10-CM

## 2018-08-16 LAB — POCT URINALYSIS DIPSTICK OB
Blood, UA: NEGATIVE
Glucose, UA: NEGATIVE
Ketones, UA: NEGATIVE
Nitrite, UA: NEGATIVE
POC,PROTEIN,UA: NEGATIVE

## 2018-08-16 MED ORDER — HYDROXYPROGESTERONE CAPROATE 275 MG/1.1ML ~~LOC~~ SOAJ
275.0000 mg | Freq: Once | SUBCUTANEOUS | Status: AC
  Administered 2018-08-16: 275 mg via SUBCUTANEOUS

## 2018-08-16 NOTE — Progress Notes (Signed)
Patient ID: Kelly Villarreal, female   DOB: 08/25/1990, 28 y.o.   MRN: [id number]    LOW-RISK PREGNANCY VISIT Patient name: Kelly Villarreal MRN [id number]  Date of birth: 06-Feb-1990 Chief Complaint:   Routine Prenatal Visit (PN2, 17P)  History of Present Illness:   YUI MULVANEY is a 28 y.o. G68P0100 female at [redacted]w[redacted]d with an Estimated Date of Delivery: 11/10/18 being seen today for ongoing management of a low-risk pregnancy. Hx of PROM resulted in fetal demise. Had CLASSICAL C/S 24.5 w, will require repeat c/s at 36-37 wk  Today she reports no complaints.  .  .   . denies leaking of fluid. Review of Systems:   Pertinent items are noted in HPI Denies abnormal vaginal discharge w/ itching/odor/irritation, headaches, visual changes, shortness of breath, chest pain, abdominal pain, severe nausea/vomiting, or problems with urination or bowel movements unless otherwise stated above. Pertinent History Reviewed:  Reviewed past medical,surgical, social, obstetrical and family history.  Reviewed problem list, medications and allergies. Physical Assessment:  There were no vitals filed for this visit.There is no height or weight on file to calculate BMI.        Physical Examination:   General appearance: Well appearing, and in no distress  Mental status: Alert, oriented to person, place, and time  Skin: Warm & dry  Cardiovascular: Normal heart rate noted  Respiratory: Normal respiratory effort, no distress  Abdomen: Soft, gravid, nontender  Pelvic: Cervical exam deferred         Extremities:    Fetal Status:          No results found for this or any previous visit (from the past 24 hour(s)).  Assessment & Plan:  1) Low-risk pregnancy G2P0100 at [redacted]w[redacted]d with an Estimated Date of Delivery: 11/10/18   2) Hx of PROM, 17p injections on Fridays 3. Hx Classical C/S, will require repeat c/s at 36-37w.   Meds: No orders of the defined types were placed in this encounter.  Labs/procedures today: 17p  injection  Plan:   Continue routine obstetrical care and 17p injection  F/u in 4 weeks PAP post partum  Reviewed: Preterm labor symptoms and general obstetric precautions including but not limited to vaginal bleeding, contractions, leaking of fluid and fetal movement were reviewed in detail with the patient.    Follow-up: No follow-ups on file.  Orders Placed This Encounter  Procedures  . POC Urinalysis Dipstick OB   By signing my name below, I, Samul Dada, attest that this documentation has been prepared under the direction and in the presence of Jonnie Kind, MD. Electronically Signed: [place]. 08/16/18. 9:27 AM.  I personally performed the services described in this documentation, which was SCRIBED in my presence. The recorded information has been reviewed and considered accurate. It has been edited as necessary during review. Jonnie Kind, MD

## 2018-08-16 NOTE — Patient Instructions (Signed)
[facility] °Call to Register: [phone] or [phone]   or   Register Online: [email or web address] °THESE CLASSES FILL UP VERY QUICKLY, SO SIGN UP AS SOON AS YOU CAN!!! °Please visit [facility] pregnancy website at [email or web address] ° °Childbirth Classes  °Option 1: Birth & Baby Series °? Series of 3 weekly classes, on the same day of the week (can choose Mon-Thurs) from 6-9pm °? Helps you and your support person prepare for childbirth °? Reviews newborn care, labor & birth, cesarean birth, pain management, and comfort techniques °? Cost: $60 per couple for insured or self-pay, $30 per couple for Medicaid ° °Option 2: Weekend Birth & Baby °? This class is a weekend version of our Birth & Baby series.  It is designed for parents who have a difficult time fitting several weeks of classes into their schedule.   °? Covers the care of your newborn and the basics of labor and childbirth °? Friday 6:30pm-8:30pm Saturday 9am-4pm, includes lunch for you and your partner  °? Cost: $75 per couple for insured or self-pay, $30 per couple for Medicaid ° °Option 3: Natural Childbirth °? This series of 5 weekly classes is for expectant parents who want to learn and practice natural methods of coping with the process of labor and childbirth.  Can choose Mon or Tues, 7-9pm.   °? Covers relaxation, breathing, massage, visualization, role of the partner, and helpful positioning °? Participants learn how to be confident in their body's ability to give birth. Class empowers and helps parents make informed decisions about care. Includes discussion that will help new parents transition into the immediate postpartum period.  °? Cost: $75 per couple for insured or self-pay, $30 per couple for Medicaid ° °Option 4: Online Birth & Baby °? This online class offers you the freedom to complete a Birth & Baby series in the comfort of your own home.  The flexibility of this option allows you to review  sections at your own pace, at times convenient to you and your support people.  It includes additional video information, animations, quizzes and extended activities. Get organized with helpful eClass tools, checklists, and trackers.  °? Cost: $60 for 60 days of online access °    °                                                                       °Other Available Classes ° °Baby & Me °Enjoy this time to discuss newborn & infant parenting topics and family adjustment issues with other new mothers in a relaxed environment. Each week brings a new speaker or baby-centered activity. We encourage mothers and their babies (birth to crawling) to join us. You are welcome to visit this group even if you haven't delivered yet! It's wonderful to make new friends early and watch other moms interact with their babies. No registration or fee.  °Big Brother/Big Sister °Let your children share in the joy of a new brother or sister in this special class designed just for them. Discussion includes how families care for babies: swaddling, holding, diapering, safety, as well as how they can be helpful in their new role. This class is designed for children ages 2 to 6, but any age is   welcome. Please register each child individually. $5 °Breastfeeding Support Group °This group is a mother-to-mother support circle where moms have the opportunity to share their breastfeeding experiences. A Breastfeeding Support nurse is present for questions and concerns. An infant scale is available for weight checks. No fee or registration.  °Breastfeeding Your Baby °Breastfeeding is a special time for mother and child. This class will help you feel ready to begin this important relationship. Your partner is encouraged to attend with you. Learn what to expect and feel more confident in the first days of breastfeeding your newborn. This class also addresses the most common fears and challenges of breastfeeding during the first few weeks, months, and  beyond. $30 per couple °Caring for Baby °This class is for expectant and adoptive parents who want to learn and practice the most up-to-date newborn care for their babies. Focus is on birth through first six weeks of life. Topics include feeding, bathing, diapering, crying, umbilical cord care, circumcision care and safe sleep. Parents learn how to recognize symptoms of illness and when to call the pediatrician. Register only the mom-to-be and your partner can plan to come with you. (*Note: This class is included in the Birth & Baby series and the Weekend Birth & Baby classes.) $10 per couple °Comfort Techniques & Tour °This 2-hour interactive class is designed for those who either do not wish to take the Birth & Baby series or for those who prefer our online childbirth class, but don't want to miss the opportunity to learn and practice hands-on techniques. These skills can help relieve some of the discomfort of labor and encourage your baby to rotate toward the best position for birth. You and your partner will be able to try a variety of labor positions with birth balls and rebozos as well as practice breathing, relaxation, and visual techniques. $20 per couple °Daddy Boot Camp °This course offers Dads-to-be the tools and knowledge needed to feel confident on their journey to becoming new fathers. Experienced dads, who have been trained as coaches, teach dads-to-be how to hold, comfort, diapers, swaddle and play with their infant while being able to support the new mom as well. $25 °Grandparent Love °Expecting a grandbaby? Learn about the latest infant care and safety recommendations and ways to support your own child as he or she transitions into the parenting role. $10 per person °Infant and Child CPR °Parents, grandparents, babysitters, and friends learn Cardio-Pulmonary Resuscitation skills for infants and children. You will also learn how to treat both conscious and unconscious choking infants and children.  Register each participant individually. (Note: This Family & Friends program does not offer certification.) $20 per person °Marvelous Multiples °Expecting twins, triplets, or more? This free 2-hour class covers the differences in labor, birth, parenting, and breastfeeding issues that face multiples' parents.  °Maternity Care Center Virtual Tour ° Online virtual tour of the new [facility] ° °Mom Talk °This free mom-led group offers support and connection to mothers as they journey through the adjustments and struggles of that sometimes overwhelming first year after the birth of a child. A member of our staff will be present to share resources and additional support if needed, as you care for yourself and baby. You are welcome to visit this group before you deliver! It's wonderful to meet new friends early and watch other moms interact with their babies.  °Waterbirth Class °Interested in a waterbirth? This free informational class will help you discover whether   waterbirth is the right fit for you and is required if you are planning a waterbirth. Education about waterbirth itself, supplies you may need, and what you may need from your support team is included in this class. Partners are encouraged to come. °  ° °

## 2018-08-17 LAB — CBC
Hematocrit: 30.3 % — ABNORMAL LOW (ref 34.0–46.6)
Hemoglobin: 9.7 g/dL — ABNORMAL LOW (ref 11.1–15.9)
MCH: 29.3 pg (ref 26.6–33.0)
MCHC: 32 g/dL (ref 31.5–35.7)
MCV: 92 fL (ref 79–97)
Platelets: 247 10*3/uL (ref 150–450)
RBC: 3.31 x10E6/uL — ABNORMAL LOW (ref 3.77–5.28)
RDW: 12.2 % (ref 11.7–15.4)
WBC: 10.1 10*3/uL (ref 3.4–10.8)

## 2018-08-17 LAB — RPR: RPR Ser Ql: NONREACTIVE

## 2018-08-17 LAB — GLUCOSE TOLERANCE, 2 HOURS W/ 1HR
Glucose, 1 hour: 97 mg/dL (ref 65–179)
Glucose, 2 hour: 100 mg/dL (ref 65–152)
Glucose, Fasting: 73 mg/dL (ref 65–91)

## 2018-08-17 LAB — ANTIBODY SCREEN: Antibody Screen: NEGATIVE

## 2018-08-17 LAB — HIV ANTIBODY (ROUTINE TESTING W REFLEX): HIV Screen 4th Generation wRfx: NONREACTIVE

## 2018-08-19 ENCOUNTER — Other Ambulatory Visit: Payer: Self-pay | Admitting: Women's Health

## 2018-08-19 DIAGNOSIS — H52223 Regular astigmatism, bilateral: Secondary | ICD-10-CM | POA: Diagnosis not present

## 2018-08-19 DIAGNOSIS — H5213 Myopia, bilateral: Secondary | ICD-10-CM | POA: Diagnosis not present

## 2018-08-19 MED ORDER — FERROUS SULFATE 325 (65 FE) MG PO TABS: 325.0000 mg | ORAL_TABLET | Freq: Two times a day (BID) | ORAL | 3 refills | Status: DC

## 2018-08-23 ENCOUNTER — Ambulatory Visit: Payer: Medicaid Other

## 2018-08-23 ENCOUNTER — Other Ambulatory Visit: Payer: Self-pay

## 2018-08-23 VITALS — BP 116/68 | HR 104 | Wt 149.4 lb

## 2018-08-23 DIAGNOSIS — Z1389 Encounter for screening for other disorder: Secondary | ICD-10-CM

## 2018-08-23 DIAGNOSIS — Z331 Pregnant state, incidental: Secondary | ICD-10-CM

## 2018-08-23 DIAGNOSIS — O09893 Supervision of other high risk pregnancies, third trimester: Secondary | ICD-10-CM

## 2018-08-23 LAB — POCT URINALYSIS DIPSTICK OB
Blood, UA: NEGATIVE
Glucose, UA: NEGATIVE
Ketones, UA: NEGATIVE
Leukocytes, UA: NEGATIVE
Nitrite, UA: NEGATIVE
POC,PROTEIN,UA: NEGATIVE

## 2018-08-23 MED ORDER — HYDROXYPROGESTERONE CAPROATE 275 MG/1.1ML ~~LOC~~ SOAJ
275.0000 mg | Freq: Once | SUBCUTANEOUS | Status: AC
  Administered 2018-08-23: 275 mg via SUBCUTANEOUS

## 2018-08-23 NOTE — Progress Notes (Signed)
Pt here makena 275 mg SQ rt arm. Tolerated well. Return 1 week for next injection. Pad CMA

## 2018-08-30 ENCOUNTER — Ambulatory Visit (INDEPENDENT_AMBULATORY_CARE_PROVIDER_SITE_OTHER): Payer: Medicaid Other | Admitting: *Deleted

## 2018-08-30 ENCOUNTER — Other Ambulatory Visit: Payer: Self-pay

## 2018-08-30 DIAGNOSIS — Z3483 Encounter for supervision of other normal pregnancy, third trimester: Secondary | ICD-10-CM

## 2018-08-30 DIAGNOSIS — O09213 Supervision of pregnancy with history of pre-term labor, third trimester: Secondary | ICD-10-CM | POA: Diagnosis not present

## 2018-08-30 DIAGNOSIS — Z3A29 29 weeks gestation of pregnancy: Secondary | ICD-10-CM

## 2018-08-30 DIAGNOSIS — Z8751 Personal history of pre-term labor: Secondary | ICD-10-CM

## 2018-08-30 MED ORDER — HYDROXYPROGESTERONE CAPROATE 275 MG/1.1ML ~~LOC~~ SOAJ
275.0000 mg | SUBCUTANEOUS | Status: DC
  Administered 2018-08-30 – 2018-10-11 (×3): 275 mg via SUBCUTANEOUS

## 2018-08-30 NOTE — Progress Notes (Signed)
17P given in left arm with no complications. Pt to return next week as scheduled.

## 2018-09-06 ENCOUNTER — Other Ambulatory Visit: Payer: Self-pay

## 2018-09-06 ENCOUNTER — Ambulatory Visit: Payer: Medicaid Other

## 2018-09-06 VITALS — BP 99/62 | HR 105 | Wt 153.0 lb

## 2018-09-06 DIAGNOSIS — O09893 Supervision of other high risk pregnancies, third trimester: Secondary | ICD-10-CM

## 2018-09-06 MED ORDER — HYDROXYPROGESTERONE CAPROATE 275 MG/1.1ML ~~LOC~~ SOAJ
275.0000 mg | Freq: Once | SUBCUTANEOUS | Status: AC
  Administered 2018-09-06: 275 mg via SUBCUTANEOUS

## 2018-09-06 NOTE — Progress Notes (Signed)
Pt here for Makena 275 mg SQ rt arm. Tolerated well. Return 1 weeks for next injection. Pad CMA 

## 2018-09-10 DIAGNOSIS — H52223 Regular astigmatism, bilateral: Secondary | ICD-10-CM | POA: Diagnosis not present

## 2018-09-10 DIAGNOSIS — H5213 Myopia, bilateral: Secondary | ICD-10-CM | POA: Diagnosis not present

## 2018-09-13 ENCOUNTER — Encounter: Payer: Self-pay | Admitting: Advanced Practice Midwife

## 2018-09-13 ENCOUNTER — Other Ambulatory Visit: Payer: Self-pay

## 2018-09-13 ENCOUNTER — Ambulatory Visit (INDEPENDENT_AMBULATORY_CARE_PROVIDER_SITE_OTHER): Payer: Medicaid Other | Admitting: Advanced Practice Midwife

## 2018-09-13 VITALS — BP 121/71 | HR 106 | Wt 153.0 lb

## 2018-09-13 DIAGNOSIS — Z3A31 31 weeks gestation of pregnancy: Secondary | ICD-10-CM | POA: Diagnosis not present

## 2018-09-13 DIAGNOSIS — O360130 Maternal care for anti-D [Rh] antibodies, third trimester, not applicable or unspecified: Secondary | ICD-10-CM | POA: Diagnosis not present

## 2018-09-13 DIAGNOSIS — Z3483 Encounter for supervision of other normal pregnancy, third trimester: Secondary | ICD-10-CM

## 2018-09-13 DIAGNOSIS — O26899 Other specified pregnancy related conditions, unspecified trimester: Secondary | ICD-10-CM

## 2018-09-13 DIAGNOSIS — Z331 Pregnant state, incidental: Secondary | ICD-10-CM

## 2018-09-13 DIAGNOSIS — O26893 Other specified pregnancy related conditions, third trimester: Secondary | ICD-10-CM

## 2018-09-13 DIAGNOSIS — Z1389 Encounter for screening for other disorder: Secondary | ICD-10-CM

## 2018-09-13 DIAGNOSIS — Z6791 Unspecified blood type, Rh negative: Secondary | ICD-10-CM

## 2018-09-13 LAB — POCT URINALYSIS DIPSTICK OB
Blood, UA: NEGATIVE
Glucose, UA: NEGATIVE
Ketones, UA: NEGATIVE
Leukocytes, UA: NEGATIVE
Nitrite, UA: NEGATIVE

## 2018-09-13 NOTE — Progress Notes (Signed)
LOW-RISK PREGNANCY VISIT Patient name: Kelly DrownHope R Hagemeister MRN [id number]  Date of birth: 16-Aug-1990 Chief Complaint:   Routine Prenatal Visit (17P, Rhogam)  History of Present Illness:   Kelly Villarreal is a 28 y.o. 702P0100 female at 3916w5d with an Estimated Date of Delivery: 11/10/18 being seen today for ongoing management of a low-risk pregnancy.  Today she reports backache. Contractions: Not present.  .  Movement: Present. denies leaking of fluid. Review of Systems:   Pertinent items are noted in HPI Denies abnormal vaginal discharge w/ itching/odor/irritation, headaches, visual changes, shortness of breath, chest pain, abdominal pain, severe nausea/vomiting, or problems with urination or bowel movements unless otherwise stated above.  Pertinent History Reviewed:  Medical & Surgical Hx:   Past Medical History:  Diagnosis Date  . Allergy    seasonal  . Anxiety 03/08/2011  . Depression 03/08/2011  . Nausea and vomiting during pregnancy 08/10/2014  . Threatened miscarriage in early pregnancy 08/10/2014  . Vaginal bleeding in pregnancy 08/10/2014   Past Surgical History:  Procedure Laterality Date  . CESAREAN SECTION N/A 12/16/2014   Procedure: CESAREAN SECTION;  Surgeon: Adam PhenixJames G Arnold, MD;  Location: [facility];  Service: Obstetrics;  Laterality: N/A;  . MOUTH SURGERY     Family History  Problem Relation Age of Onset  . Colon cancer Mother   . Hypertension Father   . Heart disease Maternal Grandmother   . Glaucoma Paternal Grandmother   . Diabetes Maternal Grandfather   . Hypertension Maternal Grandfather   . Hypertension Paternal Aunt   . Diabetes Paternal Aunt   . Diabetes Paternal Uncle   . Hypertension Paternal Uncle     Current Outpatient Medications:  .  Acetaminophen (TYLENOL PO), Take by mouth as needed., Disp: , Rfl:  .  ferrous sulfate 325 (65 FE) MG tablet, Take 1 tablet (325 mg total) by mouth 2 (two) times daily with a meal., Disp: 60 tablet, Rfl: 3 .  Prenatal Vit-Fe  Fumarate-FA (PRENATAL VITAMIN PO), Take by mouth daily. Prenatal gummies-one daily, Disp: , Rfl:  .  phenylephrine (SUDAFED PE) 10 MG TABS tablet, Take 10 mg by mouth every 4 (four) hours as needed., Disp: , Rfl:   Current Facility-Administered Medications:  .  HYDROXYprogesterone Caproate SOAJ 275 mg, 275 mg, Subcutaneous, Weekly, Lazaro ArmsEure, Luther H, MD, 275 mg at 09/13/18 1116 Social History: Reviewed -  reports that she has never smoked. She has never used smokeless tobacco.  Physical Assessment:   Vitals:   09/13/18 1103  BP: 121/71  Pulse: (!) 106  Weight: 153 lb (69.4 kg)  Body mass index is 24.69 kg/m.        Physical Examination:   General appearance: Well appearing, and in no distress  Mental status: Alert, oriented to person, place, and time  Skin: Warm & dry  Cardiovascular: Normal heart rate noted  Respiratory: Normal respiratory effort, no distress  Abdomen: Soft, gravid, nontender  Pelvic: Cervical exam deferred         Extremities: Edema: None  Fetal Status: Fetal Heart Rate (bpm): 140 Fundal Height: 32 cm Movement: Present    Results for orders placed or performed in visit on 09/13/18 (from the past 24 hour(s))  POC Urinalysis Dipstick OB   Collection Time: 09/13/18 11:12 AM  Result Value Ref Range   Color, UA     Clarity, UA     Glucose, UA Negative Negative   Bilirubin, UA     Ketones, UA neg    Spec  Grav, UA     Blood, UA neg    pH, UA     POC,PROTEIN,UA Trace Negative, Trace, Small (1+), Moderate (2+), Large (3+), 4+   Urobilinogen, UA     Nitrite, UA neg    Leukocytes, UA Negative Negative   Appearance     Odor      Assessment & Plan:  1) Low-risk pregnancy G2P0100 at [redacted]w[redacted]d with an Estimated Date of Delivery: 11/10/18   2) Hx PTD, 17 p weekly   Labs/procedures/US today: Rhogam today  Plan:  Continue routine obstetrical care weekly 17 p, schedule CS at next visit for 36-37 weeks   Follow-up: Return for 17P Weekly and 2 weeks for LROB w/LHE.   Orders Placed This Encounter  Procedures  . RHO (D) Immune Globulin  . POC Urinalysis Dipstick OB   Christin Fudge CNM 09/13/2018 11:38 AM

## 2018-09-13 NOTE — Patient Instructions (Signed)
Kelly Villarreal, I greatly value your feedback.  If you receive a survey following your visit with [place] today, we appreciate you taking the time to fill it out.  Thanks, Nigel Berthold, CNM   Call the office [phone]) or go to [facility] if:  You begin to have strong, frequent contractions  Your water breaks.  Sometimes it is a big gush of fluid, sometimes it is just a trickle that keeps getting your panties wet or running down your legs  You have vaginal bleeding.  It is normal to have a small amount of spotting if your cervix was checked.   You don't feel your baby moving like normal.  If you don't, get you something to eat and drink and lay down and focus on feeling your baby move.  You should feel at least 10 movements in 2 hours.  If you don't, you should call the office or go to [facility].    Tdap Vaccine  It is recommended that you get the Tdap vaccine during the third trimester of EACH pregnancy to help protect your baby from getting pertussis (whooping cough)  27-36 weeks is the BEST time to do this so that you can pass the protection on to your baby. During pregnancy is better than after pregnancy, but if you are unable to get it during pregnancy it will be offered at the hospital.   You will be offered this vaccine in the office after 27 weeks. If you do not have health insurance, you can get this vaccine at the health department or your family doctor  Everyone who will be around your baby should also be up-to-date on their vaccines. Adults (who are not pregnant) only need 1 dose of Tdap during adulthood.   Third Trimester of Pregnancy The third trimester is from week 29 through week 42, months 7 through 9. The third trimester is a time when the fetus is growing rapidly. At the end of the ninth month, the fetus is about 20 inches in length and weighs 6-10 pounds.  BODY CHANGES Your body goes through many changes during pregnancy. The changes vary from woman to  woman.   Your weight will continue to increase. You can expect to gain 25-35 pounds (11-16 kg) by the end of the pregnancy.  You may begin to get stretch marks on your hips, abdomen, and breasts.  You may urinate more often because the fetus is moving lower into your pelvis and pressing on your bladder.  You may develop or continue to have heartburn as a result of your pregnancy.  You may develop constipation because certain hormones are causing the muscles that push waste through your intestines to slow down.  You may develop hemorrhoids or swollen, bulging veins (varicose veins).  You may have pelvic pain because of the weight gain and pregnancy hormones relaxing your joints between the bones in your pelvis. Backaches may result from overexertion of the muscles supporting your posture.  You may have changes in your hair. These can include thickening of your hair, rapid growth, and changes in texture. Some women also have hair loss during or after pregnancy, or hair that feels dry or thin. Your hair will most likely return to normal after your baby is born.  Your breasts will continue to grow and be tender. A yellow discharge may leak from your breasts called colostrum.  Your belly button may stick out.  You may feel short of breath because of your expanding uterus.  You may notice the fetus "dropping," or moving lower in your abdomen.  You may have a bloody mucus discharge. This usually occurs a few days to a week before labor begins.  Your cervix becomes thin and soft (effaced) near your due date. WHAT TO EXPECT AT YOUR PRENATAL EXAMS  You will have prenatal exams every 2 weeks until week 36. Then, you will have weekly prenatal exams. During a routine prenatal visit:  You will be weighed to make sure you and the fetus are growing normally.  Your blood pressure is taken.  Your abdomen will be measured to track your baby's growth.  The fetal heartbeat will be listened  to.  Any test results from the previous visit will be discussed.  You may have a cervical check near your due date to see if you have effaced. At around 36 weeks, your caregiver will check your cervix. At the same time, your caregiver will also perform a test on the secretions of the vaginal tissue. This test is to determine if a type of bacteria, Group B streptococcus, is present. Your caregiver will explain this further. Your caregiver may ask you:  What your birth plan is.  How you are feeling.  If you are feeling the baby move.  If you have had any abnormal symptoms, such as leaking fluid, bleeding, severe headaches, or abdominal cramping.  If you have any questions. Other tests or screenings that may be performed during your third trimester include:  Blood tests that check for low iron levels (anemia).  Fetal testing to check the health, activity level, and growth of the fetus. Testing is done if you have certain medical conditions or if there are problems during the pregnancy. FALSE LABOR You may feel small, irregular contractions that eventually go away. These are called Braxton Hicks contractions, or false labor. Contractions may last for hours, days, or even weeks before true labor sets in. If contractions come at regular intervals, intensify, or become painful, it is best to be seen by your caregiver.  SIGNS OF LABOR   Menstrual-like cramps.  Contractions that are 5 minutes apart or less.  Contractions that start on the top of the uterus and spread down to the lower abdomen and back.  A sense of increased pelvic pressure or back pain.  A watery or bloody mucus discharge that comes from the vagina. If you have any of these signs before the 37th week of pregnancy, call your caregiver right away. You need to go to the hospital to get checked immediately. HOME CARE INSTRUCTIONS   Avoid all smoking, herbs, alcohol, and unprescribed drugs. These chemicals affect the  formation and growth of the baby.  Follow your caregiver's instructions regarding medicine use. There are medicines that are either safe or unsafe to take during pregnancy.  Exercise only as directed by your caregiver. Experiencing uterine cramps is a good sign to stop exercising.  Continue to eat regular, healthy meals.  Wear a good support bra for breast tenderness.  Do not use hot tubs, steam rooms, or saunas.  Wear your seat belt at all times when driving.  Avoid raw meat, uncooked cheese, cat litter boxes, and soil used by cats. These carry germs that can cause birth defects in the baby.  Take your prenatal vitamins.  Try taking a stool softener (if your caregiver approves) if you develop constipation. Eat more high-fiber foods, such as fresh vegetables or fruit and whole grains. Drink plenty of fluids to keep your urine  clear or pale yellow.  Take warm sitz baths to soothe any pain or discomfort caused by hemorrhoids. Use hemorrhoid cream if your caregiver approves.  If you develop varicose veins, wear support hose. Elevate your feet for 15 minutes, 3-4 times a day. Limit salt in your diet.  Avoid heavy lifting, wear low heal shoes, and practice good posture.  Rest a lot with your legs elevated if you have leg cramps or low back pain.  Visit your dentist if you have not gone during your pregnancy. Use a soft toothbrush to brush your teeth and be gentle when you floss.  A sexual relationship may be continued unless your caregiver directs you otherwise.  Do not travel far distances unless it is absolutely necessary and only with the approval of your caregiver.  Take prenatal classes to understand, practice, and ask questions about the labor and delivery.  Make a trial run to the hospital.  Pack your hospital bag.  Prepare the baby's nursery.  Continue to go to all your prenatal visits as directed by your caregiver. SEEK MEDICAL CARE IF:  You are unsure if you are in  labor or if your water has broken.  You have dizziness.  You have mild pelvic cramps, pelvic pressure, or nagging pain in your abdominal area.  You have persistent nausea, vomiting, or diarrhea.  You have a bad smelling vaginal discharge.  You have pain with urination. SEEK IMMEDIATE MEDICAL CARE IF:   You have a fever.  You are leaking fluid from your vagina.  You have spotting or bleeding from your vagina.  You have severe abdominal cramping or pain.  You have rapid weight loss or gain.  You have shortness of breath with chest pain.  You notice sudden or extreme swelling of your face, hands, ankles, feet, or legs.  You have not felt your baby move in over an hour.  You have severe headaches that do not go away with medicine.  You have vision changes. Document Released: 12/27/2000 Document Revised: 01/07/2013 Document Reviewed: 03/05/2012 [facility] Patient Information 2015 [place], [place]. This information is not intended to replace advice given to you by your health care provider. Make sure you discuss any questions you have with your health care provider.

## 2018-09-20 ENCOUNTER — Other Ambulatory Visit: Payer: Self-pay

## 2018-09-20 ENCOUNTER — Ambulatory Visit (INDEPENDENT_AMBULATORY_CARE_PROVIDER_SITE_OTHER): Payer: Medicaid Other

## 2018-09-20 VITALS — BP 110/67 | HR 96 | Ht 66.0 in | Wt 157.2 lb

## 2018-09-20 DIAGNOSIS — Z331 Pregnant state, incidental: Secondary | ICD-10-CM

## 2018-09-20 DIAGNOSIS — O09893 Supervision of other high risk pregnancies, third trimester: Secondary | ICD-10-CM

## 2018-09-20 DIAGNOSIS — Z1389 Encounter for screening for other disorder: Secondary | ICD-10-CM

## 2018-09-20 DIAGNOSIS — O09213 Supervision of pregnancy with history of pre-term labor, third trimester: Secondary | ICD-10-CM | POA: Diagnosis not present

## 2018-09-20 LAB — POCT URINALYSIS DIPSTICK OB
Blood, UA: NEGATIVE
Glucose, UA: NEGATIVE
Ketones, UA: NEGATIVE
Leukocytes, UA: NEGATIVE
Nitrite, UA: NEGATIVE
POC,PROTEIN,UA: NEGATIVE

## 2018-09-20 MED ORDER — HYDROXYPROGESTERONE CAPROATE 275 MG/1.1ML ~~LOC~~ SOAJ
275.0000 mg | Freq: Once | SUBCUTANEOUS | Status: AC
  Administered 2018-09-20: 11:00:00 275 mg via SUBCUTANEOUS

## 2018-09-20 NOTE — Progress Notes (Signed)
Pt here for Makena 275 mg SQ rt arm. Tolerated well. Return 1 weeks for next injection. Pad CMA

## 2018-09-27 ENCOUNTER — Ambulatory Visit (INDEPENDENT_AMBULATORY_CARE_PROVIDER_SITE_OTHER): Payer: Medicaid Other | Admitting: Obstetrics & Gynecology

## 2018-09-27 ENCOUNTER — Encounter: Payer: Self-pay | Admitting: Obstetrics & Gynecology

## 2018-09-27 ENCOUNTER — Other Ambulatory Visit: Payer: Self-pay

## 2018-09-27 VITALS — BP 109/65 | HR 114 | Wt 158.0 lb

## 2018-09-27 DIAGNOSIS — Z1389 Encounter for screening for other disorder: Secondary | ICD-10-CM

## 2018-09-27 DIAGNOSIS — O09213 Supervision of pregnancy with history of pre-term labor, third trimester: Secondary | ICD-10-CM

## 2018-09-27 DIAGNOSIS — Z331 Pregnant state, incidental: Secondary | ICD-10-CM

## 2018-09-27 DIAGNOSIS — Z3A33 33 weeks gestation of pregnancy: Secondary | ICD-10-CM

## 2018-09-27 DIAGNOSIS — O09893 Supervision of other high risk pregnancies, third trimester: Secondary | ICD-10-CM

## 2018-09-27 DIAGNOSIS — Z3483 Encounter for supervision of other normal pregnancy, third trimester: Secondary | ICD-10-CM

## 2018-09-27 LAB — POCT URINALYSIS DIPSTICK OB
Blood, UA: NEGATIVE
Glucose, UA: NEGATIVE
Ketones, UA: NEGATIVE
Leukocytes, UA: NEGATIVE
Nitrite, UA: NEGATIVE
POC,PROTEIN,UA: NEGATIVE

## 2018-09-27 MED ORDER — HYDROXYPROGESTERONE CAPROATE 275 MG/1.1ML ~~LOC~~ SOAJ
275.0000 mg | Freq: Once | SUBCUTANEOUS | Status: AC
  Administered 2018-09-27: 275 mg via SUBCUTANEOUS

## 2018-09-27 NOTE — Progress Notes (Signed)
   LOW-RISK PREGNANCY VISIT Patient name: Kelly Villarreal MRN [id number]  Date of birth: 1990/10/29 Chief Complaint:   Routine Prenatal Visit (17p)  History of Present Illness:   Kelly Villarreal is a 28 y.o. G73P0100 female at 110w5d with an Estimated Date of Delivery: 11/10/18 being seen today for ongoing management of a low-risk pregnancy.  Today she reports no complaints. Contractions: Not present.  .  Movement: Present. denies leaking of fluid. Review of Systems:   Pertinent items are noted in HPI Denies abnormal vaginal discharge w/ itching/odor/irritation, headaches, visual changes, shortness of breath, chest pain, abdominal pain, severe nausea/vomiting, or problems with urination or bowel movements unless otherwise stated above. Pertinent History Reviewed:  Reviewed past medical,surgical, social, obstetrical and family history.  Reviewed problem list, medications and allergies. Physical Assessment:   Vitals:   09/27/18 1002  BP: 109/65  Pulse: (!) 114  Weight: 158 lb (71.7 kg)  Body mass index is 25.5 kg/m.        Physical Examination:   General appearance: Well appearing, and in no distress  Mental status: Alert, oriented to person, place, and time  Skin: Warm & dry  Cardiovascular: Normal heart rate noted  Respiratory: Normal respiratory effort, no distress  Abdomen: Soft, gravid, nontender  Pelvic: Cervical exam deferred         Extremities: Edema: Trace  Fetal Status: Fetal Heart Rate (bpm): 144 Fundal Height: 32 cm Movement: Present    Results for orders placed or performed in visit on 09/27/18 (from the past 24 hour(s))  POC Urinalysis Dipstick OB   Collection Time: 09/27/18 10:08 AM  Result Value Ref Range   Color, UA     Clarity, UA     Glucose, UA Negative Negative   Bilirubin, UA     Ketones, UA neg    Spec Grav, UA     Blood, UA neg    pH, UA     POC,PROTEIN,UA Negative Negative, Trace, Small (1+), Moderate (2+), Large (3+), 4+   Urobilinogen, UA     Nitrite, UA neg    Leukocytes, UA Negative Negative   Appearance     Odor      Assessment & Plan:  1) Low-risk pregnancy G2P0100 at [redacted]w[redacted]d with an Estimated Date of Delivery: 11/10/18   2) Previous classical Caesarean section, scheduled C section for Dr Ilda Basset at [redacted] weeks gestation, orders are done   Meds:  Meds ordered this encounter  Medications  . HYDROXYprogesterone Caproate SOAJ 275 mg   Labs/procedures today:   Plan:  Continue routine obstetrical care   Reviewed: Preterm labor symptoms and general obstetric precautions including but not limited to vaginal bleeding, contractions, leaking of fluid and fetal movement were reviewed in detail with the patient.  All questions were answered. Has home bp cuff. Rx faxed to . Check bp weekly, let us know if >140/90.   Follow-up: Return for 1 week 17P, 2 weeks OB bisit.  Orders Placed This Encounter  Procedures  . POC Urinalysis Dipstick OB   Florian Buff  09/27/2018 10:34 AM

## 2018-10-04 ENCOUNTER — Ambulatory Visit (INDEPENDENT_AMBULATORY_CARE_PROVIDER_SITE_OTHER): Payer: Medicaid Other

## 2018-10-04 ENCOUNTER — Other Ambulatory Visit: Payer: Self-pay

## 2018-10-04 VITALS — BP 104/71 | HR 108 | Wt 163.0 lb

## 2018-10-04 DIAGNOSIS — Z3A35 35 weeks gestation of pregnancy: Secondary | ICD-10-CM | POA: Diagnosis not present

## 2018-10-04 DIAGNOSIS — Z3483 Encounter for supervision of other normal pregnancy, third trimester: Secondary | ICD-10-CM

## 2018-10-04 DIAGNOSIS — Z331 Pregnant state, incidental: Secondary | ICD-10-CM

## 2018-10-04 DIAGNOSIS — Z1389 Encounter for screening for other disorder: Secondary | ICD-10-CM

## 2018-10-04 DIAGNOSIS — O09213 Supervision of pregnancy with history of pre-term labor, third trimester: Secondary | ICD-10-CM

## 2018-10-04 LAB — POCT URINALYSIS DIPSTICK OB
Blood, UA: NEGATIVE
Glucose, UA: NEGATIVE
Ketones, UA: NEGATIVE
Leukocytes, UA: NEGATIVE
Nitrite, UA: NEGATIVE

## 2018-10-04 MED ORDER — HYDROXYPROGESTERONE CAPROATE 275 MG/1.1ML ~~LOC~~ SOAJ
275.0000 mg | Freq: Once | SUBCUTANEOUS | Status: AC
  Administered 2018-10-04: 11:00:00 275 mg via SUBCUTANEOUS

## 2018-10-04 NOTE — Progress Notes (Signed)
Pt here for Makena 275 mg SQ lt arm.Tolerated well. Return 1 week for next injection. Pad CMA

## 2018-10-08 ENCOUNTER — Telehealth: Payer: Self-pay | Admitting: *Deleted

## 2018-10-08 ENCOUNTER — Encounter (HOSPITAL_COMMUNITY): Payer: Self-pay

## 2018-10-08 NOTE — Telephone Encounter (Signed)
Pt has had some swelling and headaches. Took Tylenol yesterday for headache and that helped. I advised to keep feet elevated as much as possible, drink plenty of water and take Tylenol as needed for headache. Keep appt for 10/11/18 and call sooner if any changes. Pt voiced understanding. [place]

## 2018-10-08 NOTE — Patient Instructions (Signed)
Maybell R Pospisil  10/08/2018   Your procedure is scheduled on:  10/20/2018  Arrive at 0700 at Entrance C on [address] at [facility]  and [employer] [employer] [address]. You are invited to use the FREE valet parking or use the Visitor's parking deck.  Pick up the phone at the desk and dial [phone].  Call this number if you have problems the morning of surgery: [phone]  Remember:   Do not eat food:(After Midnight) Desps de medianoche.  Do not drink clear liquids: (After Midnight) Desps de medianoche.  Take these medicines the morning of surgery with A SIP OF WATER:  none   Do not wear jewelry, make-up or nail polish.  Do not wear lotions, powders, or perfumes. Do not wear deodorant.  Do not shave 48 hours prior to surgery.  Do not bring valuables to the hospital.  [facility] is not   responsible for any belongings or valuables brought to the hospital.  Contacts, dentures or bridgework may not be worn into surgery.  Leave suitcase in the car. After surgery it may be brought to your room.  For patients admitted to the hospital, checkout time is 11:00 AM the day of              discharge.      Please read over the following fact sheets that you were given:     Preparing for Surgery

## 2018-10-08 NOTE — Telephone Encounter (Signed)
Pt called stated that she has swelling in her hands, feet and is having headaches. Wants to know what she should do.

## 2018-10-11 ENCOUNTER — Other Ambulatory Visit: Payer: Self-pay

## 2018-10-11 ENCOUNTER — Ambulatory Visit (INDEPENDENT_AMBULATORY_CARE_PROVIDER_SITE_OTHER): Payer: Medicaid Other | Admitting: Obstetrics and Gynecology

## 2018-10-11 ENCOUNTER — Encounter: Payer: Self-pay | Admitting: Obstetrics and Gynecology

## 2018-10-11 VITALS — BP 116/68 | HR 107 | Wt 167.2 lb

## 2018-10-11 DIAGNOSIS — Z3483 Encounter for supervision of other normal pregnancy, third trimester: Secondary | ICD-10-CM

## 2018-10-11 DIAGNOSIS — Z331 Pregnant state, incidental: Secondary | ICD-10-CM

## 2018-10-11 DIAGNOSIS — Z1389 Encounter for screening for other disorder: Secondary | ICD-10-CM

## 2018-10-11 DIAGNOSIS — Z3A35 35 weeks gestation of pregnancy: Secondary | ICD-10-CM

## 2018-10-11 LAB — POCT URINALYSIS DIPSTICK OB
Blood, UA: NEGATIVE
Glucose, UA: NEGATIVE
Ketones, UA: NEGATIVE
Leukocytes, UA: NEGATIVE
Nitrite, UA: NEGATIVE
POC,PROTEIN,UA: NEGATIVE

## 2018-10-11 NOTE — Progress Notes (Signed)
Patient ID: Kelly Villarreal, female   DOB: 1990/03/09, 28 y.o.   MRN: [id number]    LOW-RISK PREGNANCY VISIT Patient name: Kelly Villarreal MRN [id number]  Date of birth: November 30, 1990 Chief Complaint:   Routine Prenatal Visit (last 17P!!!!; spotting this am)  History of Present Illness:   Kelly Villarreal is a 29 y.o. G42P0100 female at [redacted]w[redacted]d with an Estimated Date of Delivery: 11/10/18 being seen today for ongoing management of a low-risk pregnancy.  Today she reports no complaints. Contractions: Not present. Vag. Bleeding: Small.  Movement: Present. denies leaking of fluid. Review of Systems:   Pertinent items are noted in HPI Denies abnormal vaginal discharge w/ itching/odor/irritation, headaches, visual changes, shortness of breath, chest pain, abdominal pain, severe nausea/vomiting, or problems with urination or bowel movements unless otherwise stated above. Pertinent History Reviewed:  Reviewed past medical,surgical, social, obstetrical and family history.  Reviewed problem list, medications and allergies. Physical Assessment:   Vitals:   10/11/18 1110  BP: 116/68  Pulse: (!) 107  Weight: 167 lb 3.2 oz (75.8 kg)  Body mass index is 26.99 kg/m.        Physical Examination:   General appearance: Well appearing, and in no distress  Mental status: Alert, oriented to person, place, and time  Skin: Warm & dry  Cardiovascular: Normal heart rate noted  Respiratory: Normal respiratory effort, no distress  Abdomen: Soft, gravid, nontender  Pelvic: Cervical exam deferred         Extremities: Edema: Trace  Fetal Status: Fetal Heart Rate (bpm): 160 Fundal Height: 36 cm Movement: Present    Results for orders placed or performed in visit on 10/11/18 (from the past 24 hour(s))  POC Urinalysis Dipstick OB   Collection Time: 10/11/18 11:07 AM  Result Value Ref Range   Color, UA     Clarity, UA     Glucose, UA Negative Negative   Bilirubin, UA     Ketones, UA neg    Spec Grav, UA     Blood,  UA neg    pH, UA     POC,PROTEIN,UA Negative Negative, Trace, Small (1+), Moderate (2+), Large (3+), 4+   Urobilinogen, UA     Nitrite, UA neg    Leukocytes, UA Negative Negative   Appearance     Odor      Assessment & Plan:  1) Low-risk pregnancy G2P0100 at [redacted]w[redacted]d with an Estimated Date of Delivery: 11/10/18   2) Repeat c-sec, HX OF CLASSICAL C-SEC 10/20/2018 FOR REPEAT  Scheduled with Dr Ilda Basset on 10/4   Meds: No orders of the defined types were placed in this encounter.  Labs/procedures today: last 17p injection  Plan:  C-section @ 37 weeks F/u 5 days GBS/GCCHL  Reviewed: Preterm labor symptoms and general obstetric precautions including but not limited to vaginal bleeding, contractions, leaking of fluid and fetal movement were reviewed in detail with the patient.  All questions were answered.   Follow-up: Return in about 5 days (around 10/16/2018).  Orders Placed This Encounter  Procedures  . POC Urinalysis Dipstick OB   By signing my name below, I, Samul Dada, attest that this documentation has been prepared under the direction and in the presence of Jonnie Kind, MD. Electronically Signed: [place]. 10/11/18. 11:39 AM.  I personally performed the services described in this documentation, which was SCRIBED in my presence. The recorded information has been reviewed and considered accurate. It has been edited as necessary during review. Jonnie Kind,  MD

## 2018-10-15 ENCOUNTER — Encounter: Payer: Self-pay | Admitting: *Deleted

## 2018-10-16 ENCOUNTER — Other Ambulatory Visit: Payer: Self-pay

## 2018-10-16 ENCOUNTER — Other Ambulatory Visit (HOSPITAL_COMMUNITY)
Admission: RE | Admit: 2018-10-16 | Discharge: 2018-10-16 | Disposition: A | Discharge status: Home / Self Care | Payer: Medicaid Other | Source: Ambulatory Visit | Attending: Obstetrics and Gynecology | Admitting: Obstetrics and Gynecology

## 2018-10-16 ENCOUNTER — Ambulatory Visit (INDEPENDENT_AMBULATORY_CARE_PROVIDER_SITE_OTHER): Payer: Medicaid Other | Admitting: Obstetrics and Gynecology

## 2018-10-16 ENCOUNTER — Encounter: Payer: Self-pay | Admitting: Obstetrics and Gynecology

## 2018-10-16 VITALS — BP 114/67 | HR 110 | Wt 166.0 lb

## 2018-10-16 DIAGNOSIS — Z3A36 36 weeks gestation of pregnancy: Secondary | ICD-10-CM | POA: Insufficient documentation

## 2018-10-16 DIAGNOSIS — Z3483 Encounter for supervision of other normal pregnancy, third trimester: Secondary | ICD-10-CM | POA: Diagnosis not present

## 2018-10-16 DIAGNOSIS — Z331 Pregnant state, incidental: Secondary | ICD-10-CM

## 2018-10-16 DIAGNOSIS — O34219 Maternal care for unspecified type scar from previous cesarean delivery: Secondary | ICD-10-CM

## 2018-10-16 DIAGNOSIS — Z1389 Encounter for screening for other disorder: Secondary | ICD-10-CM

## 2018-10-16 LAB — POCT URINALYSIS DIPSTICK OB
Blood, UA: NEGATIVE
Glucose, UA: NEGATIVE
Ketones, UA: NEGATIVE
Nitrite, UA: NEGATIVE
POC,PROTEIN,UA: NEGATIVE

## 2018-10-16 NOTE — Patient Instructions (Signed)
Braxton Hicks Contractions Contractions of the uterus can occur throughout pregnancy, but they are not always a sign that you are in labor. You may have practice contractions called Braxton Hicks contractions. These false labor contractions are sometimes confused with true labor. What are Braxton Hicks contractions? Braxton Hicks contractions are tightening movements that occur in the muscles of the uterus before labor. Unlike true labor contractions, these contractions do not result in opening (dilation) and thinning of the cervix. Toward the end of pregnancy (32-34 weeks), Braxton Hicks contractions can happen more often and may become stronger. These contractions are sometimes difficult to tell apart from true labor because they can be very uncomfortable. You should not feel embarrassed if you go to the hospital with false labor. Sometimes, the only way to tell if you are in true labor is for your health care provider to look for changes in the cervix. The health care provider will do a physical exam and may monitor your contractions. If you are not in true labor, the exam should show that your cervix is not dilating and your water has not broken. If there are no other health problems associated with your pregnancy, it is completely safe for you to be sent home with false labor. You may continue to have Braxton Hicks contractions until you go into true labor. How to tell the difference between true labor and false labor True labor  Contractions last 30-70 seconds.  Contractions become very regular.  Discomfort is usually felt in the top of the uterus, and it spreads to the lower abdomen and low back.  Contractions do not go away with walking.  Contractions usually become more intense and increase in frequency.  The cervix dilates and gets thinner. False labor  Contractions are usually shorter and not as strong as true labor contractions.  Contractions are usually irregular.  Contractions  are often felt in the front of the lower abdomen and in the groin.  Contractions may go away when you walk around or change positions while lying down.  Contractions get weaker and are shorter-lasting as time goes on.  The cervix usually does not dilate or become thin. Follow these instructions at home:   Take over-the-counter and prescription medicines only as told by your health care provider.  Keep up with your usual exercises and follow other instructions from your health care provider.  Eat and drink lightly if you think you are going into labor.  If Braxton Hicks contractions are making you uncomfortable: ? Change your position from lying down or resting to walking, or change from walking to resting. ? Sit and rest in a tub of warm water. ? Drink enough fluid to keep your urine pale yellow. Dehydration may cause these contractions. ? Do slow and deep breathing several times an hour.  Keep all follow-up prenatal visits as told by your health care provider. This is important. Contact a health care provider if:  You have a fever.  You have continuous pain in your abdomen. Get help right away if:  Your contractions become stronger, more regular, and closer together.  You have fluid leaking or gushing from your vagina.  You pass blood-tinged mucus (bloody show).  You have bleeding from your vagina.  You have low back pain that you never had before.  You feel your baby's head pushing down and causing pelvic pressure.  Your baby is not moving inside you as much as it used to. Summary  Contractions that occur before labor are   called Braxton Hicks contractions, false labor, or practice contractions.  Braxton Hicks contractions are usually shorter, weaker, farther apart, and less regular than true labor contractions. True labor contractions usually become progressively stronger and regular, and they become more frequent.  Manage discomfort from Braxton Hicks contractions  by changing position, resting in a warm bath, drinking plenty of water, or practicing deep breathing. This information is not intended to replace advice given to you by your health care provider. Make sure you discuss any questions you have with your health care provider. Document Released: 05/18/2016 Document Revised: 12/15/2016 Document Reviewed: 05/18/2016 Elsevier Patient Education  2020 [employer] Inc[employer]  

## 2018-10-16 NOTE — Progress Notes (Signed)
   LOW-RISK PREGNANCY VISIT Patient name: Kelly Villarreal MRN [id number]  Date of birth: 1990/06/05 Chief Complaint:   Routine Prenatal Visit (GBS, GC/CHL)  History of Present Illness:   Kelly Villarreal is a 28 y.o. G105P0100 female at [redacted]w[redacted]d with an Estimated Date of Delivery: 11/10/18 being seen today for ongoing management of a low-risk pregnancy.  Today she reports no complaints. Contractions: Not present. Vag. Bleeding: None.  Movement: Present. denies leaking of fluid. Review of Systems:   Pertinent items are noted in HPI Denies abnormal vaginal discharge w/ itching/odor/irritation, headaches, visual changes, shortness of breath, chest pain, abdominal pain, severe nausea/vomiting, or problems with urination or bowel movements unless otherwise stated above. Pertinent History Reviewed:  Reviewed past medical,surgical, social, obstetrical and family history.  Reviewed problem list, medications and allergies. Physical Assessment:   Vitals:   10/16/18 1131  BP: 114/67  Pulse: (!) 110  Weight: 166 lb (75.3 kg)  Body mass index is 26.79 kg/m.        Physical Examination:   General appearance: Well appearing, and in no distress  Mental status: Alert, oriented to person, place, and time  Skin: Warm & dry  Cardiovascular: Normal heart rate noted  Respiratory: Normal respiratory effort, no distress  Abdomen: Soft, gravid, nontender  Pelvic: Cervical exam performed  Dilation: Closed Effacement (%): Thick Station: Ballotable  Extremities: Edema: Trace  Fetal Status: Fetal Heart Rate (bpm): 151 Fundal Height: 37 cm Movement: Present Presentation: Vertex  Results for orders placed or performed in visit on 10/16/18 (from the past 24 hour(s))  POC Urinalysis Dipstick OB   Collection Time: 10/16/18 11:33 AM  Result Value Ref Range   Color, UA     Clarity, UA     Glucose, UA Negative Negative   Bilirubin, UA     Ketones, UA neg    Spec Grav, UA     Blood, UA neg    pH, UA     POC,PROTEIN,UA Negative Negative, Trace, Small (1+), Moderate (2+), Large (3+), 4+   Urobilinogen, UA     Nitrite, UA neg    Leukocytes, UA Trace (A) Negative   Appearance     Odor      Assessment & Plan:  1) Low-risk pregnancy G2P0100 at [redacted]w[redacted]d with an Estimated Date of Delivery: 11/10/18   2) Encounter for supervision of other normal pregnancy in third trimester  - Cervicovaginal ancillary only( [place]),  - Strep Gp B NAA  3. Previous cesarean delivery affecting pregnancy, antepartum - H/O Previous Classical C/S - Scheduled RCS with Dr. Ilda Basset on 10/20/2018  Meds: No orders of the defined types were placed in this encounter.  Labs/procedures today: GBS  GC/CT  Plan:  Schedule C/S incision check 2 wks after d/c home from [facility]  Reviewed: Preterm labor symptoms and general obstetric precautions including but not limited to vaginal bleeding, contractions, leaking of fluid and fetal movement were reviewed in detail with the patient.  All questions were answered. Owns home bp cuff. Check bp weekly, let us know if >140/90.   Follow-up: Return in about 3 weeks (around 11/06/2018), or C/S incision check.  Orders Placed This Encounter  Procedures  . Strep Gp B NAA  . POC Urinalysis Dipstick OB   Laury Deep MSN, CNM 10/16/2018 11:49 AM

## 2018-10-17 LAB — CERVICOVAGINAL ANCILLARY ONLY
Chlamydia: NEGATIVE
Molecular Disclaimer: NEGATIVE
Molecular Disclaimer: NORMAL
Neisseria Gonorrhea: NEGATIVE

## 2018-10-18 ENCOUNTER — Other Ambulatory Visit (HOSPITAL_COMMUNITY)
Admission: RE | Admit: 2018-10-18 | Discharge: 2018-10-18 | Disposition: A | Discharge status: Home / Self Care | Payer: Medicaid Other | Source: Ambulatory Visit | Attending: Obstetrics and Gynecology | Admitting: Obstetrics and Gynecology

## 2018-10-18 ENCOUNTER — Other Ambulatory Visit: Payer: Self-pay

## 2018-10-18 ENCOUNTER — Encounter (HOSPITAL_COMMUNITY): Payer: Self-pay

## 2018-10-18 ENCOUNTER — Ambulatory Visit: Payer: Medicaid Other

## 2018-10-18 DIAGNOSIS — Z20828 Contact with and (suspected) exposure to other viral communicable diseases: Secondary | ICD-10-CM | POA: Diagnosis not present

## 2018-10-18 DIAGNOSIS — Z01818 Encounter for other preprocedural examination: Secondary | ICD-10-CM | POA: Diagnosis not present

## 2018-10-18 LAB — COMPREHENSIVE METABOLIC PANEL
ALT: 13 U/L (ref 0–44)
AST: 17 U/L (ref 15–41)
Albumin: 2.5 g/dL — ABNORMAL LOW (ref 3.5–5.0)
Alkaline Phosphatase: 99 U/L (ref 38–126)
Anion gap: 7 (ref 5–15)
BUN: <5 mg/dL — ABNORMAL LOW (ref 6–20)
CO2: 21 mmol/L — ABNORMAL LOW (ref 22–32)
Calcium: 8.9 mg/dL (ref 8.9–10.3)
Chloride: 109 mmol/L (ref 98–111)
Creatinine, Ser: 0.54 mg/dL (ref 0.44–1.00)
GFR calc Af Amer: >60 mL/min (ref >60)
GFR calc non Af Amer: >60 mL/min (ref >60)
Glucose, Bld: 72 mg/dL (ref 70–99)
Potassium: 3.7 mmol/L (ref 3.5–5.1)
Sodium: 137 mmol/L (ref 135–145)
Total Bilirubin: 0.1 mg/dL — ABNORMAL LOW (ref 0.3–1.2)
Total Protein: 5.6 g/dL — ABNORMAL LOW (ref 6.5–8.1)

## 2018-10-18 LAB — CBC
HCT: 34.8 % — ABNORMAL LOW (ref 36.0–46.0)
Hemoglobin: 11.7 g/dL — ABNORMAL LOW (ref 12.0–15.0)
MCH: 31.6 pg (ref 26.0–34.0)
MCHC: 33.6 g/dL (ref 30.0–36.0)
MCV: 94.1 fL (ref 80.0–100.0)
Platelets: 176 10*3/uL (ref 150–400)
RBC: 3.7 MIL/uL — ABNORMAL LOW (ref 3.87–5.11)
RDW: 17.1 % — ABNORMAL HIGH (ref 11.5–15.5)
WBC: 9.6 10*3/uL (ref 4.0–10.5)
nRBC: 0 % (ref 0.0–0.2)

## 2018-10-18 LAB — STREP GP B NAA: Strep Gp B NAA: NEGATIVE

## 2018-10-18 LAB — SARS CORONAVIRUS 2 BY RT PCR (HOSPITAL ORDER, PERFORMED IN ~~LOC~~ HOSPITAL LAB): SARS Coronavirus 2: NEGATIVE

## 2018-10-18 LAB — RPR: RPR Ser Ql: NONREACTIVE

## 2018-10-18 NOTE — MAU Note (Signed)
Covid swab collected. PT tolerated well.PT asymptomatic. Pt waiting for lab to draw blood for CBC/RPR/ type and screen

## 2018-10-20 ENCOUNTER — Encounter (HOSPITAL_COMMUNITY)
Admission: AD | Disposition: A | Discharge status: Home / Self Care | Payer: Self-pay | Source: Home / Self Care | Attending: Obstetrics and Gynecology

## 2018-10-20 ENCOUNTER — Other Ambulatory Visit: Payer: Self-pay

## 2018-10-20 ENCOUNTER — Inpatient Hospital Stay (HOSPITAL_COMMUNITY)
Admission: AD | Admit: 2018-10-20 | Discharge: 2018-10-22 | DRG: 788 | Disposition: A | Discharge status: Home / Self Care | Payer: Medicaid Other | Attending: Obstetrics and Gynecology | Admitting: Obstetrics and Gynecology

## 2018-10-20 ENCOUNTER — Inpatient Hospital Stay (HOSPITAL_COMMUNITY): Payer: Medicaid Other | Admitting: Anesthesiology

## 2018-10-20 ENCOUNTER — Encounter (HOSPITAL_COMMUNITY): Payer: Self-pay | Admitting: Anesthesiology

## 2018-10-20 DIAGNOSIS — O26899 Other specified pregnancy related conditions, unspecified trimester: Secondary | ICD-10-CM

## 2018-10-20 DIAGNOSIS — Z23 Encounter for immunization: Secondary | ICD-10-CM | POA: Diagnosis not present

## 2018-10-20 DIAGNOSIS — Z8751 Personal history of pre-term labor: Secondary | ICD-10-CM

## 2018-10-20 DIAGNOSIS — Z3A37 37 weeks gestation of pregnancy: Secondary | ICD-10-CM

## 2018-10-20 DIAGNOSIS — O26893 Other specified pregnancy related conditions, third trimester: Secondary | ICD-10-CM | POA: Diagnosis present

## 2018-10-20 DIAGNOSIS — O9902 Anemia complicating childbirth: Secondary | ICD-10-CM | POA: Diagnosis present

## 2018-10-20 DIAGNOSIS — D649 Anemia, unspecified: Secondary | ICD-10-CM | POA: Diagnosis present

## 2018-10-20 DIAGNOSIS — Z6791 Unspecified blood type, Rh negative: Secondary | ICD-10-CM | POA: Diagnosis not present

## 2018-10-20 DIAGNOSIS — Z3483 Encounter for supervision of other normal pregnancy, third trimester: Secondary | ICD-10-CM

## 2018-10-20 DIAGNOSIS — Z98891 History of uterine scar from previous surgery: Secondary | ICD-10-CM

## 2018-10-20 DIAGNOSIS — Z3490 Encounter for supervision of normal pregnancy, unspecified, unspecified trimester: Secondary | ICD-10-CM

## 2018-10-20 DIAGNOSIS — O34212 Maternal care for vertical scar from previous cesarean delivery: Secondary | ICD-10-CM | POA: Diagnosis present

## 2018-10-20 DIAGNOSIS — O34218 Maternal care for other type scar from previous cesarean delivery: Secondary | ICD-10-CM | POA: Diagnosis not present

## 2018-10-20 SURGERY — Surgical Case
Anesthesia: Spinal

## 2018-10-20 MED ORDER — PHENYLEPHRINE HCL (PRESSORS) 10 MG/ML IV SOLN
INTRAVENOUS | Status: DC | PRN
  Administered 2018-10-20: 80 ug via INTRAVENOUS

## 2018-10-20 MED ORDER — FENTANYL CITRATE (PF) 100 MCG/2ML IJ SOLN
INTRAMUSCULAR | Status: DC | PRN
  Administered 2018-10-20: 15 ug via INTRATHECAL

## 2018-10-20 MED ORDER — OXYTOCIN 40 UNITS IN NORMAL SALINE INFUSION - SIMPLE MED: 2.5000 [IU]/h | INTRAVENOUS | Status: AC

## 2018-10-20 MED ORDER — OXYTOCIN 40 UNITS IN NORMAL SALINE INFUSION - SIMPLE MED
INTRAVENOUS | Status: AC
  Filled 2018-10-20: qty 1000

## 2018-10-20 MED ORDER — NALOXONE HCL 4 MG/10ML IJ SOLN
1.0000 ug/kg/h | INTRAVENOUS | Status: DC | PRN
  Filled 2018-10-20: qty 5

## 2018-10-20 MED ORDER — KETOROLAC TROMETHAMINE 30 MG/ML IJ SOLN: 30.0000 mg | Freq: Once | INTRAMUSCULAR | Status: DC | PRN

## 2018-10-20 MED ORDER — ONDANSETRON HCL 4 MG/2ML IJ SOLN: 4.0000 mg | Freq: Three times a day (TID) | INTRAMUSCULAR | Status: DC | PRN

## 2018-10-20 MED ORDER — CEFAZOLIN SODIUM-DEXTROSE 2-4 GM/100ML-% IV SOLN: 2.0000 g | INTRAVENOUS | Status: DC

## 2018-10-20 MED ORDER — METOCLOPRAMIDE HCL 5 MG/ML IJ SOLN
INTRAMUSCULAR | Status: AC
  Filled 2018-10-20: qty 2

## 2018-10-20 MED ORDER — ENOXAPARIN SODIUM 40 MG/0.4ML ~~LOC~~ SOLN
40.0000 mg | SUBCUTANEOUS | Status: DC
  Administered 2018-10-21 – 2018-10-22 (×2): 40 mg via SUBCUTANEOUS
  Filled 2018-10-20 (×2): qty 0.4

## 2018-10-20 MED ORDER — SODIUM CHLORIDE 0.9% FLUSH: 3.0000 mL | INTRAVENOUS | Status: DC | PRN

## 2018-10-20 MED ORDER — SENNOSIDES-DOCUSATE SODIUM 8.6-50 MG PO TABS
2.0000 | ORAL_TABLET | ORAL | Status: DC
  Administered 2018-10-20 – 2018-10-21 (×2): 2 via ORAL
  Filled 2018-10-20 (×2): qty 2

## 2018-10-20 MED ORDER — BUPIVACAINE IN DEXTROSE 0.75-8.25 % IT SOLN
INTRATHECAL | Status: DC | PRN
  Administered 2018-10-20: 1.6 mL via INTRATHECAL

## 2018-10-20 MED ORDER — DEXAMETHASONE SODIUM PHOSPHATE 4 MG/ML IJ SOLN
INTRAMUSCULAR | Status: DC | PRN
  Administered 2018-10-20: 4 mg via INTRAVENOUS

## 2018-10-20 MED ORDER — DIBUCAINE (PERIANAL) 1 % EX OINT: 1.0000 "application " | TOPICAL_OINTMENT | CUTANEOUS | Status: DC | PRN

## 2018-10-20 MED ORDER — METOCLOPRAMIDE HCL 5 MG/ML IJ SOLN
INTRAMUSCULAR | Status: DC | PRN
  Administered 2018-10-20: 10 mg via INTRAVENOUS

## 2018-10-20 MED ORDER — NALBUPHINE HCL 10 MG/ML IJ SOLN
5.0000 mg | Freq: Once | INTRAMUSCULAR | Status: DC | PRN
  Filled 2018-10-20: qty 0.5

## 2018-10-20 MED ORDER — PHENYLEPHRINE HCL-NACL 20-0.9 MG/250ML-% IV SOLN
INTRAVENOUS | Status: AC
  Filled 2018-10-20: qty 250

## 2018-10-20 MED ORDER — DIPHENHYDRAMINE HCL 25 MG PO CAPS
25.0000 mg | ORAL_CAPSULE | ORAL | Status: DC | PRN
  Administered 2018-10-20 – 2018-10-21 (×3): 25 mg via ORAL
  Filled 2018-10-20 (×4): qty 1

## 2018-10-20 MED ORDER — COCONUT OIL OIL
1.0000 "application " | TOPICAL_OIL | Status: DC | PRN
  Administered 2018-10-21: 1 via TOPICAL

## 2018-10-20 MED ORDER — SCOPOLAMINE 1 MG/3DAYS TD PT72
MEDICATED_PATCH | TRANSDERMAL | Status: AC
  Filled 2018-10-20: qty 1

## 2018-10-20 MED ORDER — DEXAMETHASONE SODIUM PHOSPHATE 4 MG/ML IJ SOLN
INTRAMUSCULAR | Status: AC
  Filled 2018-10-20: qty 7

## 2018-10-20 MED ORDER — HYDROMORPHONE HCL 1 MG/ML IJ SOLN: 0.2500 mg | INTRAMUSCULAR | Status: DC | PRN

## 2018-10-20 MED ORDER — SODIUM CHLORIDE 0.9 % IV SOLN
INTRAVENOUS | Status: DC | PRN
  Administered 2018-10-20: 60 ug/min via INTRAVENOUS

## 2018-10-20 MED ORDER — MENTHOL 3 MG MT LOZG: 1.0000 | LOZENGE | OROMUCOSAL | Status: DC | PRN

## 2018-10-20 MED ORDER — MEPERIDINE HCL 25 MG/ML IJ SOLN: 6.2500 mg | INTRAMUSCULAR | Status: DC | PRN

## 2018-10-20 MED ORDER — CEFAZOLIN SODIUM-DEXTROSE 2-4 GM/100ML-% IV SOLN
INTRAVENOUS | Status: AC
  Filled 2018-10-20: qty 100

## 2018-10-20 MED ORDER — DIPHENHYDRAMINE HCL 50 MG/ML IJ SOLN: 12.5000 mg | INTRAMUSCULAR | Status: DC | PRN

## 2018-10-20 MED ORDER — PHENYLEPHRINE 40 MCG/ML (10ML) SYRINGE FOR IV PUSH (FOR BLOOD PRESSURE SUPPORT)
PREFILLED_SYRINGE | INTRAVENOUS | Status: AC
  Filled 2018-10-20: qty 10

## 2018-10-20 MED ORDER — HYDROCODONE-ACETAMINOPHEN 5-325 MG PO TABS
1.0000 | ORAL_TABLET | ORAL | Status: DC | PRN
  Administered 2018-10-21 – 2018-10-22 (×2): 1 via ORAL
  Filled 2018-10-20 (×3): qty 1

## 2018-10-20 MED ORDER — SIMETHICONE 80 MG PO CHEW: 80.0000 mg | CHEWABLE_TABLET | ORAL | Status: DC | PRN

## 2018-10-20 MED ORDER — LACTATED RINGERS IV SOLN
INTRAVENOUS | Status: DC
  Administered 2018-10-20 (×3): via INTRAVENOUS

## 2018-10-20 MED ORDER — DIPHENHYDRAMINE HCL 25 MG PO CAPS
25.0000 mg | ORAL_CAPSULE | Freq: Four times a day (QID) | ORAL | Status: DC | PRN
  Administered 2018-10-20 – 2018-10-21 (×2): 25 mg via ORAL
  Filled 2018-10-20: qty 1

## 2018-10-20 MED ORDER — PROMETHAZINE HCL 25 MG/ML IJ SOLN: 6.2500 mg | INTRAMUSCULAR | Status: DC | PRN

## 2018-10-20 MED ORDER — CEFAZOLIN SODIUM-DEXTROSE 2-3 GM-%(50ML) IV SOLR
INTRAVENOUS | Status: DC | PRN
  Administered 2018-10-20: 2 g via INTRAVENOUS

## 2018-10-20 MED ORDER — SODIUM CHLORIDE 0.9 % IV SOLN
INTRAVENOUS | Status: DC | PRN
  Administered 2018-10-20: 09:00:00 via INTRAVENOUS

## 2018-10-20 MED ORDER — NALBUPHINE HCL 10 MG/ML IJ SOLN
5.0000 mg | INTRAMUSCULAR | Status: DC | PRN
  Filled 2018-10-20: qty 0.5

## 2018-10-20 MED ORDER — ACETAMINOPHEN 500 MG PO TABS
1000.0000 mg | ORAL_TABLET | Freq: Four times a day (QID) | ORAL | Status: AC
  Administered 2018-10-20 – 2018-10-21 (×3): 1000 mg via ORAL
  Filled 2018-10-20 (×3): qty 2

## 2018-10-20 MED ORDER — PRENATAL MULTIVITAMIN CH
1.0000 | ORAL_TABLET | Freq: Every day | ORAL | Status: DC
  Administered 2018-10-21 – 2018-10-22 (×2): 1 via ORAL
  Filled 2018-10-20 (×2): qty 1

## 2018-10-20 MED ORDER — KETOROLAC TROMETHAMINE 30 MG/ML IJ SOLN: 30.0000 mg | Freq: Four times a day (QID) | INTRAMUSCULAR | Status: AC | PRN

## 2018-10-20 MED ORDER — KETOROLAC TROMETHAMINE 30 MG/ML IJ SOLN
30.0000 mg | Freq: Four times a day (QID) | INTRAMUSCULAR | Status: AC
  Administered 2018-10-20 – 2018-10-21 (×3): 30 mg via INTRAVENOUS
  Filled 2018-10-20 (×3): qty 1

## 2018-10-20 MED ORDER — SCOPOLAMINE 1 MG/3DAYS TD PT72
MEDICATED_PATCH | TRANSDERMAL | Status: DC | PRN
  Administered 2018-10-20: 1 via TRANSDERMAL

## 2018-10-20 MED ORDER — KETOROLAC TROMETHAMINE 30 MG/ML IJ SOLN
30.0000 mg | Freq: Four times a day (QID) | INTRAMUSCULAR | Status: AC | PRN
  Administered 2018-10-20: 11:00:00 30 mg via INTRAVENOUS

## 2018-10-20 MED ORDER — WITCH HAZEL-GLYCERIN EX PADS: 1.0000 "application " | MEDICATED_PAD | CUTANEOUS | Status: DC | PRN

## 2018-10-20 MED ORDER — KETOROLAC TROMETHAMINE 30 MG/ML IJ SOLN
INTRAMUSCULAR | Status: AC
  Filled 2018-10-20: qty 1

## 2018-10-20 MED ORDER — SCOPOLAMINE 1 MG/3DAYS TD PT72: 1.0000 | MEDICATED_PATCH | Freq: Once | TRANSDERMAL | Status: DC

## 2018-10-20 MED ORDER — ONDANSETRON HCL 4 MG/2ML IJ SOLN
INTRAMUSCULAR | Status: DC | PRN
  Administered 2018-10-20: 4 mg via INTRAVENOUS

## 2018-10-20 MED ORDER — MORPHINE SULFATE (PF) 0.5 MG/ML IJ SOLN
INTRAMUSCULAR | Status: DC | PRN
  Administered 2018-10-20: 150 ug via INTRATHECAL

## 2018-10-20 MED ORDER — LACTATED RINGERS IV SOLN
INTRAVENOUS | Status: DC
  Administered 2018-10-20 – 2018-10-21 (×2): via INTRAVENOUS

## 2018-10-20 MED ORDER — OXYTOCIN 10 UNIT/ML IJ SOLN
INTRAMUSCULAR | Status: DC | PRN
  Administered 2018-10-20: 40 [IU]

## 2018-10-20 MED ORDER — IBUPROFEN 800 MG PO TABS
800.0000 mg | ORAL_TABLET | Freq: Four times a day (QID) | ORAL | Status: DC
  Administered 2018-10-21 – 2018-10-22 (×4): 800 mg via ORAL
  Filled 2018-10-20 (×5): qty 1

## 2018-10-20 MED ORDER — SIMETHICONE 80 MG PO CHEW
80.0000 mg | CHEWABLE_TABLET | Freq: Three times a day (TID) | ORAL | Status: DC
  Administered 2018-10-20 – 2018-10-22 (×5): 80 mg via ORAL
  Filled 2018-10-20 (×5): qty 1

## 2018-10-20 MED ORDER — SIMETHICONE 80 MG PO CHEW
80.0000 mg | CHEWABLE_TABLET | ORAL | Status: DC
  Administered 2018-10-20 – 2018-10-21 (×2): 80 mg via ORAL
  Filled 2018-10-20 (×2): qty 1

## 2018-10-20 MED ORDER — RHO D IMMUNE GLOBULIN 1500 UNIT/2ML IJ SOSY
300.0000 ug | PREFILLED_SYRINGE | Freq: Once | INTRAMUSCULAR | Status: AC
  Administered 2018-10-21: 300 ug via INTRAMUSCULAR
  Filled 2018-10-20: qty 2

## 2018-10-20 MED ORDER — FENTANYL CITRATE (PF) 100 MCG/2ML IJ SOLN
INTRAMUSCULAR | Status: AC
  Filled 2018-10-20: qty 2

## 2018-10-20 MED ORDER — NALOXONE HCL 0.4 MG/ML IJ SOLN: 0.4000 mg | INTRAMUSCULAR | Status: DC | PRN

## 2018-10-20 MED ORDER — ONDANSETRON HCL 4 MG/2ML IJ SOLN
INTRAMUSCULAR | Status: AC
  Filled 2018-10-20: qty 2

## 2018-10-20 MED ORDER — MORPHINE SULFATE (PF) 0.5 MG/ML IJ SOLN
INTRAMUSCULAR | Status: AC
  Filled 2018-10-20: qty 10

## 2018-10-20 MED ORDER — INFLUENZA VAC SPLIT QUAD 0.5 ML IM SUSY: 0.5000 mL | PREFILLED_SYRINGE | INTRAMUSCULAR | Status: DC

## 2018-10-20 SURGICAL SUPPLY — 33 items
BENZOIN TINCTURE PRP APPL 2/3 (GAUZE/BANDAGES/DRESSINGS) ×2 IMPLANT
CANISTER SUCT 3000ML PPV (MISCELLANEOUS) ×2 IMPLANT
CHLORAPREP W/TINT 26ML (MISCELLANEOUS) ×2 IMPLANT
DRSG OPSITE POSTOP 4X10 (GAUZE/BANDAGES/DRESSINGS) ×2 IMPLANT
ELECT REM PT RETURN 9FT ADLT (ELECTROSURGICAL) ×2
ELECTRODE REM PT RTRN 9FT ADLT (ELECTROSURGICAL) ×1 IMPLANT
EXTRACTOR VACUUM KIWI (MISCELLANEOUS) ×2 IMPLANT
GLOVE BIOGEL PI IND STRL 7.0 (GLOVE) ×2 IMPLANT
GLOVE BIOGEL PI IND STRL 7.5 (GLOVE) ×1 IMPLANT
GLOVE BIOGEL PI INDICATOR 7.0 (GLOVE) ×2
GLOVE BIOGEL PI INDICATOR 7.5 (GLOVE) ×1
GLOVE SKINSENSE NS SZ7.0 (GLOVE) ×1
GLOVE SKINSENSE STRL SZ7.0 (GLOVE) ×1 IMPLANT
GOWN STRL REUS W/ TWL LRG LVL3 (GOWN DISPOSABLE) ×2 IMPLANT
GOWN STRL REUS W/ TWL XL LVL3 (GOWN DISPOSABLE) ×1 IMPLANT
GOWN STRL REUS W/TWL LRG LVL3 (GOWN DISPOSABLE) ×2
GOWN STRL REUS W/TWL XL LVL3 (GOWN DISPOSABLE) ×1
NS IRRIG 1000ML POUR BTL (IV SOLUTION) ×2 IMPLANT
PACK C SECTION WH (CUSTOM PROCEDURE TRAY) ×2 IMPLANT
PAD ABD 7.5X8 STRL (GAUZE/BANDAGES/DRESSINGS) ×2 IMPLANT
PAD OB MATERNITY 4.3X12.25 (PERSONAL CARE ITEMS) ×2 IMPLANT
PAD PREP 24X48 CUFFED NSTRL (MISCELLANEOUS) ×2 IMPLANT
PENCIL SMOKE EVAC W/HOLSTER (ELECTROSURGICAL) ×2 IMPLANT
STRIP CLOSURE SKIN 1/2X4 (GAUZE/BANDAGES/DRESSINGS) ×2 IMPLANT
SUT MNCRL 0 VIOLET CTX 36 (SUTURE) ×2 IMPLANT
SUT MON AB 4-0 PS1 27 (SUTURE) ×2 IMPLANT
SUT MONOCRYL 0 CTX 36 (SUTURE) ×2
SUT PLAIN 2 0 XLH (SUTURE) ×2 IMPLANT
SUT VIC AB 0 CT1 36 (SUTURE) ×4 IMPLANT
SUT VIC AB 3-0 CT1 27 (SUTURE) ×1
SUT VIC AB 3-0 CT1 TAPERPNT 27 (SUTURE) ×1 IMPLANT
TOWEL OR 17X24 6PK STRL BLUE (TOWEL DISPOSABLE) ×4 IMPLANT
WATER STERILE IRR 1000ML POUR (IV SOLUTION) ×2 IMPLANT

## 2018-10-20 NOTE — H&P (Signed)
Obstetrics Admission History & Physical  10/20/2018 - 7:52 AM Primary OBGYN: [facility]  Chief Complaint: scheduled rpt c/s  History of Present Illness  28 y.o. G2P0100 @ [redacted]w[redacted]d, with the above CC. Pregnancy complicated by: h/o classical c-section, h/o PTD with neonatal demise, Rh neg  [name] denies any OB issues. She states she'd like to be induced  Review of Systems: as noted in the History of Present Illness.  Patient Active Problem List   Diagnosis Date Noted  . Supervision of normal pregnancy 05/07/2018  . History of preterm delivery 03/20/2018  . History of classical cesarean section 03/20/2018  . Grief reaction 05/31/2015  . Seasonal allergies 04/07/2015  . Rh negative state in antepartum period 09/01/2014     PMHx:  Past Medical History:  Diagnosis Date  . Allergy    seasonal  . Anxiety 03/08/2011  . Depression 03/08/2011  . Nausea and vomiting during pregnancy 08/10/2014  . Threatened miscarriage in early pregnancy 08/10/2014  . Vaginal bleeding in pregnancy 08/10/2014   PSHx:  Past Surgical History:  Procedure Laterality Date  . CESAREAN SECTION N/A 12/16/2014   Procedure: CESAREAN SECTION;  Surgeon: Woodroe Mode, MD;  Location: [facility];  Service: Obstetrics;  Laterality: N/A;  . MOUTH SURGERY     Medications:  Facility-Administered Medications Prior to Admission  Medication Dose Route Frequency Provider Last Rate Last Dose  . HYDROXYprogesterone Caproate SOAJ 275 mg  275 mg Subcutaneous Weekly Florian Buff, MD   275 mg at 10/11/18 1112   Medications Prior to Admission  Medication Sig Dispense Refill Last Dose  . acetaminophen (TYLENOL) 500 MG tablet Take 1,000 mg by mouth every 6 (six) hours as needed (pain.).     Marland Kitchen ferrous sulfate 325 (65 FE) MG tablet Take 1 tablet (325 mg total) by mouth 2 (two) times daily with a meal. 60 tablet 3   . Phenylephrine-Acetaminophen 5-325 MG TABS Take 2 tablets by mouth 2 (two) times daily as needed (sinus  pressure/pain.). Sudafed PE Pressure + Pain     . Prenatal MV & Min w/FA-DHA (PRENATAL ADULT GUMMY/DHA/FA PO) Take 1 tablet by mouth daily.        Allergies: has No Known Allergies. OBHx:  OB History  Gravida Para Term Preterm AB Living  2 1   1    0  SAB TAB Ectopic Multiple Live Births        0 1    # Outcome Date GA Lbr Len/2nd Weight Sex Delivery Anes PTL Lv  2 Current           1 Preterm 12/16/14 [redacted]w[redacted]d  860 g M CS-LTranv Gen  ND     Complications: History of preterm premature rupture of membranes (PPROM), Placental abruption           FHx:  Family History  Problem Relation Age of Onset  . Colon cancer Mother   . Hypertension Father   . Heart disease Maternal Grandmother   . Glaucoma Paternal Grandmother   . Diabetes Maternal Grandfather   . Hypertension Maternal Grandfather   . Hypertension Paternal Aunt   . Diabetes Paternal Aunt   . Diabetes Paternal Uncle   . Hypertension Paternal Uncle    Soc Hx:  Social History   Socioeconomic History  . Marital status: Married    Spouse name: Jeneen Rinks  . Number of children: 1  . Years of education: Not on file  . Highest education level: Not on file  Occupational  History  . Not on file  Social Needs  . Financial resource strain: Not hard at all  . Food insecurity    Worry: Never true    Inability: Never true  . Transportation needs    Medical: No    Non-medical: No  Tobacco Use  . Smoking status: Never Smoker  . Smokeless tobacco: Never Used  Substance and Sexual Activity  . Alcohol use: Not Currently    Comment: wine   . Drug use: No    Types: Marijuana  . Sexual activity: Yes    Partners: Male    Birth control/protection: None  Lifestyle  . Physical activity    Days per week: Not on file    Minutes per session: Not on file  . Stress: Not on file  Relationships  . Social Musician on phone: Not on file    Gets together: Not on file    Attends religious service: Not on file    Active member of  club or organization: Not on file    Attends meetings of clubs or organizations: Not on file    Relationship status: Not on file  . Intimate partner violence    Fear of current or ex partner: Not on file    Emotionally abused: Not on file    Physically abused: Not on file    Forced sexual activity: Not on file  Other Topics Concern  . Not on file  Social History Narrative  . Not on file    Objective    Current Vital Signs 24h Vital Sign Ranges  T 98.3 F (36.8 C) Temp  Avg: 98.3 F (36.8 C)  Min: 98.3 F (36.8 C)  Max: 98.3 F (36.8 C)  BP 106/71 BP  Min: 106/71  Max: 106/71  HR   No data recorded  RR 18 Resp  Avg: 18  Min: 18  Max: 18  SaO2     No data recorded       24 Hour I/O Current Shift I/O  Time Ins Outs No intake/output data recorded. No intake/output data recorded.   EFM: normal FHR. NST just started  Toco: see above  General: Well nourished, well developed female in no acute distress.  Skin:  Warm and dry.  Cardiovascular: S1, S2 normal, no murmur, rub or gallop, regular rate and rhythm Respiratory:  Clear to auscultation bilateral. Normal respiratory effort Abdomen: gravid, nttp Neuro/Psych:  Normal mood and affect.   Labs  COVID: negative Recent Labs  Lab 10/18/18 0925  WBC 9.6  HGB 11.7*  HCT 34.8*  PLT 176    Recent Labs  Lab 10/18/18 0925  NA 137  K 3.7  CL 109  CO2 21*  BUN <5*  CREATININE 0.54  CALCIUM 8.9  PROT 5.6*  BILITOT 0.1*  ALKPHOS 99  ALT 13  AST 17  GLUCOSE 72   O NEG  Radiology Anterior placenta  Assessment & Plan   28 y.o. G2P0100 @ [redacted]w[redacted]d doing well *Pregnancy: d/w her that given type of hysterotomy that labor is not recommended. Pt okay with rpt c/s *Rh neg: rhogam PP PRN  Can proceed when OR is ready  Cornelia Copa MD Attending Center for [facility])

## 2018-10-20 NOTE — Anesthesia Postprocedure Evaluation (Signed)
Anesthesia Post Note  Patient: Kelly Villarreal  Procedure(s) Performed: REPEAT CESAREAN SECTION (N/A )     Patient location during evaluation: PACU Anesthesia Type: Spinal Level of consciousness: awake Pain management: pain level controlled Vital Signs Assessment: post-procedure vital signs reviewed and stable Respiratory status: spontaneous breathing Cardiovascular status: stable Postop Assessment: no headache, no backache, spinal receding, patient able to bend at knees and no apparent nausea or vomiting Anesthetic complications: no    Last Vitals:  Vitals:   10/20/18 1115 10/20/18 1130  BP: 99/67 100/69  Pulse: 84 73  Resp: 13 11  Temp: (!) 36.3 C (!) 36.3 C  SpO2: 100% 100%    Last Pain:  Vitals:   10/20/18 1130  TempSrc:   PainSc: 0-No pain   Pain Goal:    LLE Motor Response: Purposeful movement (10/20/18 1130)   RLE Motor Response: Purposeful movement (10/20/18 1130)       Epidural/Spinal Function Cutaneous sensation: Tingles (10/20/18 1130), Patient able to flex knees: Yes (10/20/18 1130), Patient able to lift hips off bed: No (10/20/18 1130), Back pain beyond tenderness at insertion site: No (10/20/18 1130), Progressively worsening motor and/or sensory loss: No (10/20/18 1130), Bowel and/or bladder incontinence post epidural: No (10/20/18 1130)  Huston Foley

## 2018-10-20 NOTE — Transfer of Care (Signed)
Immediate Anesthesia Transfer of Care Note  Patient: Kelly Villarreal  Procedure(s) Performed: REPEAT CESAREAN SECTION (N/A )  Patient Location: PACU  Anesthesia Type:Spinal  Level of Consciousness: awake, alert  and oriented  Airway & Oxygen Therapy: Patient Spontanous Breathing  Post-op Assessment: Report given to RN and Post -op Vital signs reviewed and stable  Post vital signs: Reviewed and stable  Last Vitals:  Vitals Value Taken Time  BP    Temp    Pulse 82 10/20/18 1018  Resp 14 10/20/18 1018  SpO2 100 % 10/20/18 1018  Vitals shown include unvalidated device data.  Last Pain:  Vitals:   10/20/18 0717  TempSrc: Oral         Complications: No apparent anesthesia complications

## 2018-10-20 NOTE — Anesthesia Preprocedure Evaluation (Signed)
Anesthesia Evaluation  Patient identified by MRN, date of birth, ID band Patient awake    Reviewed: Allergy & Precautions, H&P , NPO status , Patient's Chart, lab work & pertinent test results  Airway Mallampati: I  TM Distance: >3 FB Neck ROM: full    Dental no notable dental hx. (+) Teeth Intact   Pulmonary neg pulmonary ROS,    Pulmonary exam normal breath sounds clear to auscultation       Cardiovascular negative cardio ROS Normal cardiovascular exam Rhythm:regular Rate:Normal     Neuro/Psych PSYCHIATRIC DISORDERS Anxiety Depression negative neurological ROS     GI/Hepatic negative GI ROS, Neg liver ROS,   Endo/Other  negative endocrine ROS  Renal/GU negative Renal ROS     Musculoskeletal   Abdominal Normal abdominal exam  (+) - obese,   Peds  Hematology negative hematology ROS (+)   Anesthesia Other Findings   Reproductive/Obstetrics (+) Pregnancy                             Anesthesia Physical Anesthesia Plan  ASA: II  Anesthesia Plan: Spinal   Post-op Pain Management:    Induction:   PONV Risk Score and Plan: Ondansetron, Dexamethasone and Scopolamine patch - Pre-op  Airway Management Planned: Nasal Cannula and Natural Airway  Additional Equipment: None  Intra-op Plan:   Post-operative Plan:   Informed Consent: I have reviewed the patients History and Physical, chart, labs and discussed the procedure including the risks, benefits and alternatives for the proposed anesthesia with the patient or authorized representative who has indicated his/her understanding and acceptance.       Plan Discussed with: CRNA  Anesthesia Plan Comments:         Anesthesia Quick Evaluation

## 2018-10-20 NOTE — Lactation Note (Signed)
This note was copied from a baby's chart. Lactation Consultation Note  Patient Name: [place] KGYJE'H Date: 10/20/2018 Reason for consult: Initial assessment;Other (Comment);1st time breastfeeding;Early term 37-38.6wks(DAT (+))  12 hours ETI female who is being exclusively BF by his mother, she's a P2 but this is her first time BF, she experienced a neonatal demise at 26 weeks, she also reported that her milk came in between day 3-4 after her first baby was born. She reported (+) breast changes during this pregnancy. Mom participated in the [facility] program at [facility] and she's already familiar with hand expression. When reviewing hand expression with mom, unable to get colostrum at this point, mom has areola edema, she's still on IV fluids; but her tissue is compressible.  Baby was born through a c-section, he was very spitty and not ready to feed, very gaggy too; [place] had to use the bulb syringe when he was having an episode during [facility] consultation. Asked mom to call for assistance when needed. Baby is DAT (+) mom understands about the higher risk of jaundice and she's willing to start pumping tonight. Barry set mom up with breast shells and a DEBP, instructions, cleaning and storage were reviewed. Mom also has a [place] baby DEBP at home. Reviewed normal newborn behavior, cluster feeding, feeding cues, lactogenesis II and newborn jaundice.   Feeding plan:  1. Encouraged mom to feed baby STS 8-12 times/24 hours or sooner if feeding cues are present 2. She'll pump after feedings, at least 6-8 times/24 hours and will offer any amount of EBM she may get to baby 3. Hand expression and spoon feeding were also encouraged 4. Mom will start wearing her shells tomorrow morning, daytime only  BF brochure, BF resources and feeding diary were reviewed. Mom reported all questions and concerns were answered, she's aware of [facility] OP services and will call PRN.  Maternal Data Formula Feeding for Exclusion:  No Has patient been taught Hand Expression?: Yes Does the patient have breastfeeding experience prior to this delivery?: No  Feeding Feeding Type: Breast Fed   Interventions Interventions: Breast feeding basics reviewed;Skin to skin;Breast massage;Hand express;Breast compression;DEBP;Shells  Lactation Tools Discussed/Used Tools: Shells;Pump Shell Type: Inverted Breast pump type: Double-Electric Breast Pump WIC Program: Yes Pump Review: Setup, frequency, and cleaning Initiated by:: MPeck Date initiated:: 10/20/18   Consult Status Consult Status: Follow-up Date: 10/21/18 Follow-up type: In-patient    Tore Carreker Francene Boyers 10/20/2018, 9:38 PM

## 2018-10-20 NOTE — Anesthesia Procedure Notes (Signed)
Spinal  Patient location during procedure: OR Start time: 10/20/2018 9:00 AM End time: 10/20/2018 9:03 AM Staffing Anesthesiologist: Lyn Hollingshead, MD Other anesthesia staff: Estill Batten, RN Performed: other anesthesia staff  Preanesthetic Checklist Completed: patient identified, site marked, surgical consent, pre-op evaluation, timeout performed, IV checked, risks and benefits discussed and monitors and equipment checked Spinal Block Patient position: sitting Prep: DuraPrep Patient monitoring: heart rate, cardiac monitor, continuous pulse ox and blood pressure Approach: midline Location: L3-4 Injection technique: single-shot Needle Needle type: Sprotte  Needle gauge: 24 G Needle length: 9 cm Assessment Sensory level: T4

## 2018-10-20 NOTE — Discharge Summary (Signed)
Postpartum Discharge Summary      Patient Name: Kelly Villarreal DOB: 1990/03/26 MRN: [id number]  Date of admission: 10/20/2018 Delivering Provider: Aletha Halim   Date of discharge: 10/22/2018  Admitting diagnosis: previous classical c section Intrauterine pregnancy: [redacted]w[redacted]d    Secondary diagnosis:  Active Problems:   Rh negative state in antepartum period   History of preterm delivery   History of classical cesarean section   Supervision of normal pregnancy   Cesarean delivery delivered  Additional problems: None     Discharge diagnosis: Term Pregnancy Delivered                                                                                                Post partum procedures:rhogam  Augmentation: n/a  Complications: None  Hospital course:  Sceduled C/S   28y.o. yo G2P0100 at 29w0das admitted to the hospital 10/20/2018 for scheduled cesarean section with the following indication:history of a classical cesarean.  Membrane Rupture Time/Date: 9:21 AM ,10/20/2018   Patient delivered a Viable infant.10/20/2018  Details of operation can be found in separate operative note.  Pateint had an uncomplicated postpartum course.  She is ambulating, tolerating a regular diet, passing flatus, and urinating well. Patient is discharged home in stable condition on  10/22/18        Delivery time: 9:21 AM    Magnesium Sulfate received: No BMZ received: No Rhophylac:Yes MMR:N/A Transfusion:No  Physical exam  Vitals:   10/21/18 0306 10/21/18 1428 10/21/18 2109 10/22/18 0524  BP: (!) 95/55 114/70 113/73 109/66  Pulse: 70  94 85  Resp: 16 16    Temp: 98 F (36.7 C) 99 F (37.2 C) 98.4 F (36.9 C) 98.2 F (36.8 C)  TempSrc:  Oral Oral Oral  SpO2: 100%  100%   Weight:      Height:       General: alert, cooperative and no distress Lochia: appropriate Uterine Fundus: firm Incision: Healing well with no significant drainage DVT Evaluation: No evidence of DVT seen on physical  exam. Labs: Lab Results  Component Value Date   WBC 14.5 (H) 10/21/2018   HGB 11.3 (L) 10/21/2018   HCT 33.9 (L) 10/21/2018   MCV 95.5 10/21/2018   PLT 196 10/21/2018   CMP Latest Ref Rng & Units 10/18/2018  Glucose 70 - 99 mg/dL 72  BUN 6 - 20 mg/dL <5(L)  Creatinine 0.44 - 1.00 mg/dL 0.54  Sodium 135 - 145 mmol/L 137  Potassium 3.5 - 5.1 mmol/L 3.7  Chloride 98 - 111 mmol/L 109  CO2 22 - 32 mmol/L 21(L)  Calcium 8.9 - 10.3 mg/dL 8.9  Total Protein 6.5 - 8.1 g/dL 5.6(L)  Total Bilirubin 0.3 - 1.2 mg/dL 0.1(L)  Alkaline Phos 38 - 126 U/L 99  AST 15 - 41 U/L 17  ALT 0 - 44 U/L 13    Discharge instruction: per After Visit Summary and "Baby and Me Booklet".  After visit meds:  Allergies as of 10/22/2018   No Known Allergies     Medication List    STOP taking these medications   acetaminophen  500 MG tablet Commonly known as: TYLENOL   Phenylephrine-Acetaminophen 5-325 MG Tabs     TAKE these medications   ferrous sulfate 325 (65 FE) MG tablet Take 1 tablet (325 mg total) by mouth 2 (two) times daily with a meal.   ibuprofen 800 MG tablet Commonly known as: ADVIL Take 1 tablet (800 mg total) by mouth every 8 (eight) hours as needed.   oxyCODONE-acetaminophen 5-325 MG tablet Commonly known as: Percocet Take 1-2 tablets by mouth every 6 (six) hours as needed for severe pain.   PRENATAL ADULT GUMMY/DHA/FA PO Take 1 tablet by mouth daily.       Diet: routine diet  Activity: Advance as tolerated. Pelvic rest for 6 weeks.   Outpatient follow up:6 weeks Follow up Appt: Future Appointments  Date Time Provider [place]  10/28/2018  2:30 PM Florian Buff, MD [facility]   Follow up Visit:  Please schedule this patient for Postpartum visit in: 6 weeks with the following provider: Any provider For C/S patients schedule nurse incision check in weeks 2 weeks: yes High risk pregnancy complicated by: Hx placental abruption w IUFD at 72 weeks, hx of  classical cesarean Delivery mode:  CS Anticipated Birth Control:  POPs PP Procedures needed: Incision check  Schedule Integrated BH visit: no     Newborn Data: Live born female  Birth Weight:   APGAR: 6, 78  Newborn Delivery   Birth date/time: 10/20/2018 09:21:00 Delivery type: C-Section, Low Transverse Trial of labor: No C-section categorization: Repeat      Baby Feeding: Breast Disposition:home with mother  Marcille Buffy DNP, CNM  10/22/18  10:47 AM

## 2018-10-21 ENCOUNTER — Encounter (HOSPITAL_COMMUNITY): Payer: Self-pay | Admitting: *Deleted

## 2018-10-21 LAB — CBC
HCT: 33.9 % — ABNORMAL LOW (ref 36.0–46.0)
Hemoglobin: 11.3 g/dL — ABNORMAL LOW (ref 12.0–15.0)
MCH: 31.8 pg (ref 26.0–34.0)
MCHC: 33.3 g/dL (ref 30.0–36.0)
MCV: 95.5 fL (ref 80.0–100.0)
Platelets: 196 10*3/uL (ref 150–400)
RBC: 3.55 MIL/uL — ABNORMAL LOW (ref 3.87–5.11)
RDW: 16.8 % — ABNORMAL HIGH (ref 11.5–15.5)
WBC: 14.5 10*3/uL — ABNORMAL HIGH (ref 4.0–10.5)
nRBC: 0 % (ref 0.0–0.2)

## 2018-10-21 MED ORDER — INFLUENZA VAC SPLIT QUAD 0.5 ML IM SUSY
0.5000 mL | PREFILLED_SYRINGE | INTRAMUSCULAR | Status: AC
  Administered 2018-10-21: 0.5 mL via INTRAMUSCULAR
  Filled 2018-10-21: qty 0.5

## 2018-10-21 MED ORDER — INFLUENZA VAC A&B SA ADJ QUAD 0.5 ML IM PRSY: 0.5000 mL | PREFILLED_SYRINGE | INTRAMUSCULAR | Status: DC

## 2018-10-21 NOTE — Progress Notes (Addendum)
POSTPARTUM PROGRESS NOTE  POD #1  Subjective:  Kelly Villarreal is a 28 y.o. G2P0100 s/p LTCS at [redacted]w[redacted]d.  She reports she doing well. No acute events overnight. She reports she is doing well . She denies any problems with ambulating, voiding or po intake. Denies nausea or vomiting. She has passed flatus. Pain is well controlled.  Lochia is moderate but decreasing.  Objective: Blood pressure (!) 95/55, pulse 70, temperature 98 F (36.7 C), resp. rate 16, height 5\' 6"  (1.676 m), weight 75.3 kg, last menstrual period 02/03/2018, SpO2 100 %.  Physical Exam:  General: alert, cooperative and no distress Chest: no respiratory distress Heart:regular rate, distal pulses intact Abdomen: soft, + bowel sounds  Uterine Fundus: firm, appropriately tender DVT Evaluation: No calf swelling or tenderness Extremities:edema Skin: warm, dry; incision clean/dry/intact w/  honeycomb dressing in place  Recent Labs    10/18/18 0925 10/21/18 0547  HGB 11.7* 11.3*  HCT 34.8* 33.9*    Assessment/Plan: Kelly Villarreal is a 28 y.o. G2P0100 s/p LTCS at [redacted]w[redacted]d for hx of classical c-section.  POD#1 - Doing welll; pain well controlled. H/H appropriate  Routine postpartum care  OOB, ambulated  Lovenox for VTE prophylaxis Anemia: asymptomatic  Contraception: POP  Feeding: breast   Dispo: Plan for discharge tomorrow or Wednesday    LOS: 1 day   Kelly Shave, MD  PGY-1, [facility]  10/21/2018, 8:03 AM  OB FELLOW ATTESTATION  I have seen and examined this patient and agree with above documentation in the resident's note except as noted below.  Agree with above  Augustin Coupe, MD/MPH OB Fellow  10/21/2018, 10:25 AM

## 2018-10-21 NOTE — Progress Notes (Signed)
MOB was referred for history of depression/anxiety. * Referral screened out by Clinical Social Worker because none of the following criteria appear to apply: ~ History of anxiety/depression during this pregnancy, or of post-partum depression following prior delivery. ~ Diagnosis of anxiety and/or depression within last 3 years. Per Mob's chart review, MOB diagnosed with anxiety/depression in 2013. OR * MOB's symptoms currently being treated with medication and/or therapy.    CSW aware that MOB scored 5 on Edinburgh with no concerns to CSW.       Izell Labat S. Jaeli Grubb, MSW, LCSW [facility] [phone]      

## 2018-10-21 NOTE — Lactation Note (Addendum)
This note was copied from a baby's chart. Lactation Consultation Note  Patient Name: Boy Leonila Speranza XIPJA'S Date: 10/21/2018 Reason for consult: Follow-up assessment;Early term 37-38.6wks;1st time breastfeeding  P1 mother whose infant is now 78 hours old.  This is an ETI at 37+0 weeks.  Mother had a fetal demise at 28 weeks with her first pregnancy.  Mother stated that she had no questions/concerns related to breast feeding.  The DEBP was set up by the previous LC, however, mother has not pumped at all today.  She stated, "I tried once but didn't get anything so I stopped."  Educated her on the importance of using the DEBP for breast stimulation to help encourage a milk supply.  Discussed the ETI and the relationship between feedings, weight loss, jaundice and the need for supplementation.  Mother will pump now and after every feeding for 15 minutes.  Asked her continue doing hand expression before/after pumping and to give back any EBM she obtains to baby.    Mother informed me that he is sleepy at times but she has heard him swallow.  Since I have not witnessed a feeding I suggested she call her RN/LC when baby is ready to feed again so we may observe.  He had recently fed and was asleep in her arms.  Mother verbalized understanding.  I asked her to feed at least every three hours or sooner if he shows cues.  She has a DEBP for home use.  Father present. RN updated and will have day shift RN inform night shift RN to closely monitor this baby for adequate feedings and supplementation if mother should get EBM.   Maternal Data Formula Feeding for Exclusion: No Has patient been taught Hand Expression?: Yes Does the patient have breastfeeding experience prior to this delivery?: No  Feeding Feeding Type: Breast Fed  LATCH Score                   Interventions    Lactation Tools Discussed/Used WIC Program: Yes   Consult Status Consult Status: Follow-up Date:  10/22/18 Follow-up type: In-patient    Little Ishikawa 10/21/2018, 5:46 PM

## 2018-10-22 ENCOUNTER — Ambulatory Visit: Payer: Self-pay

## 2018-10-22 LAB — BPAM RBC
Blood Product Expiration Date: 202010182359
Blood Product Expiration Date: 202010212359
Unit Type and Rh: 9500
Unit Type and Rh: 9500

## 2018-10-22 LAB — TYPE AND SCREEN
ABO/RH(D): O NEG
Antibody Screen: POSITIVE
Unit division: 0
Unit division: 0

## 2018-10-22 LAB — RH IG WORKUP (INCLUDES ABO/RH)
ABO/RH(D): O NEG
Fetal Screen: NEGATIVE
Gestational Age(Wks): 37
Unit division: 0

## 2018-10-22 LAB — SURGICAL PATHOLOGY

## 2018-10-22 MED ORDER — OXYCODONE-ACETAMINOPHEN 5-325 MG PO TABS: 1.0000 | ORAL_TABLET | Freq: Four times a day (QID) | ORAL | 0 refills | Status: DC | PRN

## 2018-10-22 MED ORDER — IBUPROFEN 800 MG PO TABS: 800.0000 mg | ORAL_TABLET | Freq: Three times a day (TID) | ORAL | 0 refills | Status: DC | PRN

## 2018-10-22 MED ORDER — FLUMAZENIL 0.5 MG/5ML IV SOLN
INTRAVENOUS | Status: AC
  Filled 2018-10-22: qty 5

## 2018-10-22 NOTE — Op Note (Signed)
Attestation signed by Aletha Halim, MD at 10/22/2018 11:06 AM  Agree with above. I was present and scrubbed for the entire procedure.   Durene Romans MD Attending Center for [facility])         Show:Clear all [x] Manual[x] Template[] Copied  Added by: [x] Clarnce Flock, MD  [] Hover for details Operative Note   SURGERY DATE: 10/20/2018  PRE-OP DIAGNOSIS:  *Pregnancy at [redacted]w[redacted]d *History of classical cesarean  POST-OP DIAGNOSIS: Same   PROCEDURE: repeat low transverse cesarean section via pfannenstiel skin incision with double layer uterine closure  SURGEON: Surgeon(s) and Role:    * Aletha Halim, MD - Primary    * Nicolette Bang, DO - Resident - Assisting    * Dione Plover, Annice Needy, MD - Resident - Assisting  ANESTHESIA: spinal  ESTIMATED BLOOD LOSS: 401 cc  DRAINS: 150 mL UOP via indwelling foley  TOTAL IV FLUIDS: 2000 mL crystalloid  VTE PROPHYLAXIS: SCDs to bilateral lower extremities  ANTIBIOTICS: Two grams of Cefazolin were given., within 1 hour of skin incision  SPECIMENS: Placenta to pathology  COMPLICATIONS: none  INDICATIONS: history of classical cesarean  FINDINGS: No intra-abdominal adhesions were noted. Grossly normal uterus, tubes and ovaries. Clear amniotic fluid, cephalic female infant, weight 2850 gm, APGARs 8/9, intact placenta.  PROCEDURE IN DETAIL: The patient was taken to the operating room where anesthesia was administered and normal fetal heart tones were confirmed. She was then prepped and draped in the normal fashion in the dorsal supine position with a leftward tilt.  After a time out was performed, a pfannensteil skin incision was made with the scalpel and carried through to the underlying layer of fascia. The fascia was then incised at the midline and this incision was extended laterally with the mayo scissors. Attention was turned to the superior aspect of the fascial  incision which was grasped with the kocher clamps x 2, tented up and the rectus muscles were dissected off with the scalpel. In a similar fashion the inferior aspect of the fascial incision was grasped with the kocher clamps, tented up and the rectus muscles dissected off with the mayo scissors. The rectus muscles were then separated in the midline and the peritoneum was entered bluntly. The bladder blade was inserted and the vesicouterine peritoneum was identified, tented up and entered with the metzenbaum scissors. This incision was extended laterally and the bladder flap was created digitally. The bladder blade was reinserted.  A low transverse hysterotomy was made with the scalpel until the endometrial cavity was breached and the amniotic sac ruptured, yielding clear amniotic fluid. This incision was extended bluntly and the infant's head, shoulders and body were delivered atraumatically.The cord was clamped x 2 and cut, and the infant was handed to the awaiting pediatricians. The placenta was then gradually expressed from the uterus and then the uterus was exteriorized and cleared of all clots and debris. The hysterotomy was repaired with a running suture of 0 Monocryl. A second imbricating layer of 0 Monocryl suture was then placed.  The uterus and adnexa were then returned to the abdomen, and the hysterotomy and all operative sites were reinspected and excellent hemostasis was noted after irrigation and suction of the abdomen with warm saline.  The peritoneum was closed with a running stitch of 3-0 Vicryl. The fascia was reapproximated with 0 Vicryl in a simple running fashion bilaterally. The subcutaneous layer was less than 3 cm in depth and was not re-approximated. The skin was then closed with 4-0  monocryl, in a subcuticular fashion.  The patient  tolerated the procedure well. Sponge, lap, needle, and instrument counts were correct x 2. The patient was transferred to the recovery room awake,  alert and breathing independently in stable condition.  Zack Seal, MD/MPH [facility] Fellow Center for [employer] (Faculty Practice)         Cosigned by: [place] Bing, MD at 10/22/2018 11:06 AM  Electronically signed by Venora Maples, MD at 10/20/2018 8:19 PM  Electronically signed by Kirby Bing, MD at 10/22/2018 11:06 AM

## 2018-10-22 NOTE — Lactation Note (Signed)
This note was copied from a baby's chart. Lactation Consultation Note:  [place] arrived in Mothers room. Infant lying in crib cuing.  Mother reports that she just tried to feed him and he only suckled a few times.   Mother sat on the side of the breast to try and feed infant in cradle hold swaddled in blankets.  Offered assistance. Mother agreeable to sit in the chair.  Mother has bilateral positional strips on her nipples.    Mother assist with placing infant in cross cradle hold STS. Assist mother with hand expression. Mother taught to latch infant with off sided latch.  Mother reports slight discomfort until infants lips flanged wide. Infant sustained latch for 15 mins.  Lots of teaching with mother.  Assist with placing infant on the alternate breast in football hold.  Infant suckled with good steady rhythm.  Advised mother to use these tips and technique to latch infant.   Mother was given comfort gels and a hand pump. Encouraged to continue to hand express before and after feeding and pumping.  Mother to post pump for 15 mins.  after feedings to offer ebm for supplement.   Discussed that infant is a LPI and that he should be supplemented with ebm after breastfeeding. Mother reports that she has a pump at home.   Mother was given LPI guidelines with review.   Mother to continue to breastfeed infant 8-12 times in 24 hours. Discussed  Cluster feeding and cue base feeding.   Encouraged mother to follow up with [facility] services. She would like call to schedule an appt.  Patient Name: Boy Zuleika Gallus [id number] Date: 10/22/2018 Reason for consult: Follow-up assessment   Maternal Data    Feeding Feeding Type: Breast Fed  LATCH Score Latch: Grasps breast easily, tongue down, lips flanged, rhythmical sucking.  Audible Swallowing: Spontaneous and intermittent  Type of Nipple: Everted at rest and after stimulation  Comfort (Breast/Nipple): Filling, red/small blisters or bruises, mild/mod  discomfort(positional strips)  Hold (Positioning): Assistance needed to correctly position infant at breast and maintain latch.  LATCH Score: 8  Interventions Interventions: Assisted with latch;Skin to skin;Hand express;Breast compression;Adjust position;Support pillows;Position options;Comfort gels;Hand pump  Lactation Tools Discussed/Used     Consult Status Consult Status: Complete    Darla Lesches 10/22/2018, 1:29 PM

## 2018-10-22 NOTE — Progress Notes (Addendum)
POSTPARTUM PROGRESS NOTE  POD #2  Subjective:  Kelly Villarreal is a 28 y.o. G2P1101 s/p LTCS at [redacted]w[redacted]d.  She reports she doing well. No acute events overnight. She denies any problems with ambulating, voiding or po intake. Denies nausea or vomiting. She has passed flatus and had one bowel movement. Pain is moderate but is well controlled.  Lochia is mild.  Objective: Blood pressure 109/66, pulse 85, temperature 98.2 F (36.8 C), temperature source Oral, resp. rate 16, height 5\' 6"  (1.676 m), weight 75.3 kg, last menstrual period 02/03/2018, SpO2 100 %, unknown if currently breastfeeding.  Physical Exam:  General: alert, cooperative and no distress Chest: no respiratory distress Heart:regular rate, distal pulses intact Abdomen: soft, nontender,  Uterine Fundus: firm, appropriately tender DVT Evaluation: No calf swelling or tenderness Extremities: No lower extremity edema Skin: warm, dry; incision clean/dry/intact w/ honeycomb dressing in place  Recent Labs    10/21/18 0547  HGB 11.3*  HCT 33.9*    Assessment/Plan: Kelly Villarreal is a 28 y.o. G2P1101 s/p LTCS at [redacted]w[redacted]d for hx of classical c-section .  POD#2 - Doing welll; pain well controlled. H/H appropriate  Routine postpartum care  OOB, ambulated  Ambulate frequently for VTE prophylaxis, add on SCD's if not Anemia: asymptomatic  Start po ferrous sulfate BID at discharge Contraception: POPs Feeding: Breast  Dispo: Plan for discharge 10/6 or 10/7. Discussed dispo plans with patient. She would like to go home if baby does, but would like to stay as patient if baby stays. Will have day team check in the afternoon to discuss dispo again.   LOS: 2 days   Mina Marble, D.O. [place], PGY2 10/22/2018, 8:03 AM

## 2018-10-24 ENCOUNTER — Encounter: Payer: Self-pay | Admitting: Family Medicine

## 2018-10-28 ENCOUNTER — Other Ambulatory Visit: Payer: Self-pay

## 2018-10-28 ENCOUNTER — Encounter: Payer: Self-pay | Admitting: Obstetrics & Gynecology

## 2018-10-28 ENCOUNTER — Ambulatory Visit (INDEPENDENT_AMBULATORY_CARE_PROVIDER_SITE_OTHER): Payer: Medicaid Other | Admitting: Obstetrics & Gynecology

## 2018-10-28 VITALS — BP 119/80 | HR 98 | Ht 66.25 in | Wt 155.0 lb

## 2018-10-28 DIAGNOSIS — Z98891 History of uterine scar from previous surgery: Secondary | ICD-10-CM

## 2018-10-28 NOTE — Progress Notes (Signed)
  HPI: Patient returns for routine postoperative follow-up having undergone repeat C section on 10/20/2018.  The patient's immediate postoperative recovery has been unremarkable. Since hospital discharge the patient reports no problems.   Current Outpatient Medications: ibuprofen (ADVIL) 800 MG tablet, Take 1 tablet (800 mg total) by mouth every 8 (eight) hours as needed., Disp: 30 tablet, Rfl: 0 oxyCODONE-acetaminophen (PERCOCET) 5-325 MG tablet, Take 1-2 tablets by mouth every 6 (six) hours as needed for severe pain., Disp: 20 tablet, Rfl: 0 Prenatal MV & Min w/FA-DHA (PRENATAL ADULT GUMMY/DHA/FA PO), Take 1 tablet by mouth daily., Disp: , Rfl:  ferrous sulfate 325 (65 FE) MG tablet, Take 1 tablet (325 mg total) by mouth 2 (two) times daily with a meal., Disp: 60 tablet, Rfl: 3  No current facility-administered medications for this visit.     Blood pressure 119/80, pulse 98, height 5' 6.25" (1.683 m), weight 155 lb (70.3 kg), currently breastfeeding.  Physical Exam: Incision clean dry intact Abdomen soft non tender  Diagnostic Tests:   Pathology:   Impression: S/p repeat Caeasrean section  Plan:   Follow up: 5  weeks  Florian Buff, MD

## 2018-12-02 ENCOUNTER — Encounter: Payer: Self-pay | Admitting: Women's Health

## 2018-12-02 ENCOUNTER — Ambulatory Visit (INDEPENDENT_AMBULATORY_CARE_PROVIDER_SITE_OTHER): Payer: Medicaid Other | Admitting: Women's Health

## 2018-12-02 ENCOUNTER — Other Ambulatory Visit: Payer: Self-pay

## 2018-12-02 DIAGNOSIS — Z1389 Encounter for screening for other disorder: Secondary | ICD-10-CM | POA: Diagnosis not present

## 2018-12-02 DIAGNOSIS — Z98891 History of uterine scar from previous surgery: Secondary | ICD-10-CM

## 2018-12-02 DIAGNOSIS — Z3202 Encounter for pregnancy test, result negative: Secondary | ICD-10-CM

## 2018-12-02 DIAGNOSIS — Z30011 Encounter for initial prescription of contraceptive pills: Secondary | ICD-10-CM

## 2018-12-02 DIAGNOSIS — Z8751 Personal history of pre-term labor: Secondary | ICD-10-CM

## 2018-12-02 LAB — POCT URINE PREGNANCY: Preg Test, Ur: NEGATIVE

## 2018-12-02 MED ORDER — NORETHINDRONE 0.35 MG PO TABS: 1.0000 | ORAL_TABLET | Freq: Every day | ORAL | 11 refills | Status: DC

## 2018-12-02 NOTE — Progress Notes (Signed)
POSTPARTUM VISIT Patient name: Kelly Villarreal MRN [id number]  Date of birth: Mar 01, 1990 Chief Complaint:   Postpartum Care  History of Present Illness:   Kelly Villarreal is a 28 y.o. G83P1101 African American female being seen today for a postpartum visit. She is 6 weeks postpartum following a repeat c/s, low transverse this time, prior was classical at 37.0 gestational weeks. Anesthesia: spinal. Laceration: n/a. I have fully reviewed the prenatal and intrapartum course. Pregnancy complicated by h/o 24wk PPROM/abruption w/ neonatal death. Postpartum course has been uncomplicated. Bleeding no bleeding. Bowel function is normal. Bladder function is normal.  Patient is not sexually active. Last sexual activity: prior to birth of baby.  Contraception method is wants POPs.    Last pap 09/16/15.  Results were normal .  No LMP recorded.  Baby's course has been uncomplicated. Baby is feeding by breast    Edinburgh Postpartum Depression Screening: negative Edinburgh Postnatal Depression Scale - 12/02/18 1444      Edinburgh Postnatal Depression Scale:  In the Past 7 Days   I have been able to laugh and see the funny side of things.  0    I have looked forward with enjoyment to things.  0    I have blamed myself unnecessarily when things went wrong.  1    I have been anxious or worried for no good reason.  0    I have felt scared or panicky for no good reason.  0    Things have been getting on top of me.  1    I have been so unhappy that I have had difficulty sleeping.  0    I have felt sad or miserable.  0    I have been so unhappy that I have been crying.  0    The thought of harming myself has occurred to me.  0    Edinburgh Postnatal Depression Scale Total  2      Review of Systems:   Pertinent items are noted in HPI Denies Abnormal vaginal discharge w/ itching/odor/irritation, headaches, visual changes, shortness of breath, chest pain, abdominal pain, severe nausea/vomiting, or problems  with urination or bowel movements. Pertinent History Reviewed:  Reviewed past medical,surgical, obstetrical and family history.  Reviewed problem list, medications and allergies. OB History  Gravida Para Term Preterm AB Living  2 2 1 1   1   SAB TAB Ectopic Multiple Live Births        0 2    # Outcome Date GA Lbr Len/2nd Weight Sex Delivery Anes PTL Lv  2 Term 10/20/18 [redacted]w[redacted]d  6 lb 4.5 oz (2.85 kg) M CS-LTranv Spinal  LIV  1 Preterm 12/16/14 [redacted]w[redacted]d  1 lb 14.3 oz (0.86 kg) M CS-LTranv Gen  ND     Complications: History of preterm premature rupture of membranes (PPROM), Placental abruption   Physical Assessment:   Vitals:   12/02/18 1441  BP: 121/72  Pulse: 92  Weight: 167 lb 6.4 oz (75.9 kg)  Height: 5' 6.25" (1.683 m)  Body mass index is 26.82 kg/m.       Physical Examination:   General appearance: alert, well appearing, and in no distress  Mental status: alert, oriented to person, place, and time  Skin: warm & dry   Cardiovascular: normal heart rate noted   Respiratory: normal respiratory effort, no distress   Breasts: deferred, no complaints   Abdomen: soft, non-tender, C/S incision well healed  Pelvic:not examined  Extremities: no edema  Results for orders placed or performed in visit on 12/02/18 (from the past 24 hour(s))  POCT urine pregnancy   Collection Time: 12/02/18  2:47 PM  Result Value Ref Range   Preg Test, Ur Negative Negative    Assessment & Plan:  1) Postpartum exam 2) 6 wks s/p RCS 3) Breastfeeding 4) Depression screening 5) Contraception counseling, pt prefers oral progesterone-only contraceptive. Rx micronor w/ 11RF, understands has to take at exact same time daily to be effective, if late taking use condom as back-up   Meds:  Meds ordered this encounter  Medications  . norethindrone (MICRONOR) 0.35 MG tablet    Sig: Take 1 tablet (0.35 mg total) by mouth daily.    Dispense:  1 Package    Refill:  11    Order Specific Question:    Supervising Provider    Answer:   Florian Buff [[id number]]    Follow-up: Return for 1st available, Pap & physical.   Orders Placed This Encounter  Procedures  . POCT urine pregnancy    [place], [facility] 12/02/2018 3:03 PM

## 2018-12-02 NOTE — Patient Instructions (Signed)
Norethindrone tablets (contraception) What is this medicine? NORETHINDRONE (nor eth IN drone) is an oral contraceptive. The product contains a female hormone known as a progestin. It is used to prevent pregnancy. This medicine may be used for other purposes; ask your health care provider or pharmacist if you have questions. COMMON BRAND NAME(S): Camila, Deblitane 28-Day, Errin, Heather, Jencycla, Jolivette, Lyza, Nor-QD, Nora-BE, Norlyroc, Ortho Micronor, Sharobel 28-Day What should I tell my health care provider before I take this medicine? They need to know if you have any of these conditions:  blood vessel disease or blood clots  breast, cervical, or vaginal cancer  diabetes  heart disease  kidney disease  liver disease  mental depression  migraine  seizures  stroke  vaginal bleeding  an unusual or allergic reaction to norethindrone, other medicines, foods, dyes, or preservatives  pregnant or trying to get pregnant  breast-feeding How should I use this medicine? Take this medicine by mouth with a glass of water. You may take it with or without food. Follow the directions on the prescription label. Take this medicine at the same time each day and in the order directed on the package. Do not take your medicine more often than directed. Contact your pediatrician regarding the use of this medicine in children. Special care may be needed. This medicine has been used in female children who have started having menstrual periods. A patient package insert for the product will be given with each prescription and refill. Read this sheet carefully each time. The sheet may change frequently. Overdosage: If you think you have taken too much of this medicine contact a poison control center or emergency room at once. NOTE: This medicine is only for you. Do not share this medicine with others. What if I miss a dose? Try not to miss a dose. Every time you miss a dose or take a dose late  your chance of pregnancy increases. When 1 pill is missed (even if only 3 hours late), take the missed pill as soon as possible and continue taking a pill each day at the regular time (use a back up method of birth control for the next 48 hours). If more than 1 dose is missed, use an additional birth control method for the rest of your pill pack until menses occurs. Contact your health care professional if more than 1 dose has been missed. What may interact with this medicine? Do not take this medicine with any of the following medications:  amprenavir or fosamprenavir  bosentan This medicine may also interact with the following medications:  antibiotics or medicines for infections, especially rifampin, rifabutin, rifapentine, and griseofulvin, and possibly penicillins or tetracyclines  aprepitant  barbiturate medicines, such as phenobarbital  carbamazepine  felbamate  modafinil  oxcarbazepine  phenytoin  ritonavir or other medicines for HIV infection or AIDS  St. John's wort  topiramate This list may not describe all possible interactions. Give your health care provider a list of all the medicines, herbs, non-prescription drugs, or dietary supplements you use. Also tell them if you smoke, drink alcohol, or use illegal drugs. Some items may interact with your medicine. What should I watch for while using this medicine? Visit your doctor or health care professional for regular checks on your progress. You will need a regular breast and pelvic exam and Pap smear while on this medicine. Use an additional method of birth control during the first cycle that you take these tablets. If you have any reason to think you   are pregnant, stop taking this medicine right away and contact your doctor or health care professional. If you are taking this medicine for hormone related problems, it may take several cycles of use to see improvement in your condition. This medicine does not protect you  against HIV infection (AIDS) or any other sexually transmitted diseases. What side effects may I notice from receiving this medicine? Side effects that you should report to your doctor or health care professional as soon as possible:  breast tenderness or discharge  pain in the abdomen, chest, groin or leg  severe headache  skin rash, itching, or hives  sudden shortness of breath  unusually weak or tired  vision or speech problems  yellowing of skin or eyes Side effects that usually do not require medical attention (report to your doctor or health care professional if they continue or are bothersome):  changes in sexual desire  change in menstrual flow  facial hair growth  fluid retention and swelling  headache  irritability  nausea  weight gain or loss This list may not describe all possible side effects. Call your doctor for medical advice about side effects. You may report side effects to FDA at 1-800-FDA-1088. Where should I keep my medicine? Keep out of the reach of children. Store at room temperature between 15 and 30 degrees C (59 and 86 degrees F). Throw away any unused medicine after the expiration date. NOTE: This sheet is a summary. It may not cover all possible information. If you have questions about this medicine, talk to your doctor, pharmacist, or health care provider.  2020 Elsevier/Gold Standard (2011-09-22 16:41:35)  

## 2018-12-17 ENCOUNTER — Other Ambulatory Visit: Payer: Medicaid Other | Admitting: Women's Health

## 2019-01-20 ENCOUNTER — Ambulatory Visit: Payer: Self-pay | Admitting: Family Medicine

## 2019-01-21 ENCOUNTER — Ambulatory Visit (INDEPENDENT_AMBULATORY_CARE_PROVIDER_SITE_OTHER): Payer: Medicaid Other | Admitting: Family Medicine

## 2019-01-21 ENCOUNTER — Encounter: Payer: Self-pay | Admitting: Family Medicine

## 2019-01-21 ENCOUNTER — Other Ambulatory Visit: Payer: Self-pay

## 2019-01-21 VITALS — BP 112/64 | HR 90 | Temp 98.7°F | Resp 14 | Ht 66.25 in | Wt 176.0 lb

## 2019-01-21 DIAGNOSIS — M1712 Unilateral primary osteoarthritis, left knee: Secondary | ICD-10-CM

## 2019-01-21 DIAGNOSIS — G8929 Other chronic pain: Secondary | ICD-10-CM | POA: Diagnosis not present

## 2019-01-21 DIAGNOSIS — M25561 Pain in right knee: Secondary | ICD-10-CM

## 2019-01-21 DIAGNOSIS — D509 Iron deficiency anemia, unspecified: Secondary | ICD-10-CM | POA: Diagnosis not present

## 2019-01-21 DIAGNOSIS — M25562 Pain in left knee: Secondary | ICD-10-CM

## 2019-01-21 MED ORDER — DICLOFENAC SODIUM 1 % EX GEL: CUTANEOUS | 1 refills | Status: DC

## 2019-01-21 NOTE — Patient Instructions (Signed)
Referral orthopedics We will call with results F/u as needed

## 2019-01-21 NOTE — Progress Notes (Signed)
   Subjective:    Patient ID: Kelly Villarreal, female    DOB: 24-Dec-1990, 29 y.o.   MRN: [id number]  Patient presents for B Knee Pain (x months- states that since she had baby in Oct 2020, knees feel weak and stiff)    Pt here with bilat knee pain.Ths has been present the past few months. She feels like her gives will out when she tries to stand up or step a certain. Pain also with squatting or direct pressure on knees. She does get some swelling in ankle. She takes tylenol as needed No back pain  Xray left knee from April 2018 IMPRESSION: Mild medial compartment narrowing may be projectional versus early Osteoarthrosis. She also has underlying iron deficiency anemia she has been on iron supplements but has not had this checked since she delivered.  She is also on prenatal vitamins and Micronor.  She states that her birth control does cause her to have daily nausea but she is breast-feeding.  Review Of Systems:  GEN- denies fatigue, fever, weight loss,weakness, recent illness HEENT- denies eye drainage, change in vision, nasal discharge, CVS- denies chest pain, palpitations RESP- denies SOB, cough, wheeze ABD- denies N/V, change in stools, abd pain GU- denies dysuria, hematuria, dribbling, incontinence MSK- + joint pain, muscle aches, injury Neuro- denies headache, dizziness, syncope, seizure activity       Objective:    BP 112/64   Pulse 90   Temp 98.7 F (37.1 C) (Temporal)   Resp 14   Ht 5' 6.25" (1.683 m)   Wt 176 lb (79.8 kg)   SpO2 98%   BMI 28.19 kg/m  GEN- NAD, alert and oriented x3 HEENT- PERRL, EOMI, non injected sclera, pink conjunctiva, MMM, oropharynx clear CVS- RRR, no murmur RESP-CTAB Musculoskeletal good range of motion bilateral knees no effusion normal appearance crepitus right greater than left ligaments grossly intact mild tender to palpation anterior knee bilaterally EXT- No edema Pulses- Radial2+        Assessment & Plan:      Problem List  Items Addressed This Visit    None    Visit Diagnoses    Chronic pain of both knees    -  Primary   Chronic pain of the knees with history of early arthritis on x-ray 3 years ago.  We will have her evaluated by orthopedics.  Have her use topical Voltaren gel as she is currently breast-feeding want to minimize medications that she is taking.  She can also use the acetaminophen if need be as well.  My concern is that there is more than typical patellofemoral syndrome that we would see at her age   Relevant Orders   Ambulatory referral to Orthopedic Surgery   Primary osteoarthritis of left knee       Relevant Orders   Ambulatory referral to Orthopedic Surgery   Iron deficiency anemia, unspecified iron deficiency anemia type       Obtain CBC along with iron level to see if she needs to continue with chronic iron therapy   Relevant Orders   CBC with Differential   Iron, TIBC and Ferritin Panel      Note: This dictation was prepared with Dragon dictation along with smaller phrase technology. Any transcriptional errors that result from this process are unintentional.

## 2019-01-22 LAB — CBC WITH DIFFERENTIAL/PLATELET
Absolute Monocytes: 692 cells/uL (ref 200–950)
Basophils Absolute: 30 cells/uL (ref 0–200)
Basophils Relative: 0.4 %
Eosinophils Absolute: 129 cells/uL (ref 15–500)
Eosinophils Relative: 1.7 %
HCT: 40 % (ref 35.0–45.0)
Hemoglobin: 13.1 g/dL (ref 11.7–15.5)
Lymphs Abs: 2478 cells/uL (ref 850–3900)
MCH: 29.4 pg (ref 27.0–33.0)
MCHC: 32.8 g/dL (ref 32.0–36.0)
MCV: 89.9 fL (ref 80.0–100.0)
MPV: 9.6 fL (ref 7.5–12.5)
Monocytes Relative: 9.1 %
Neutro Abs: 4271 cells/uL (ref 1500–7800)
Neutrophils Relative %: 56.2 %
Platelets: 320 10*3/uL (ref 140–400)
RBC: 4.45 10*6/uL (ref 3.80–5.10)
RDW: 12.1 % (ref 11.0–15.0)
Total Lymphocyte: 32.6 %
WBC: 7.6 10*3/uL (ref 3.8–10.8)

## 2019-01-22 LAB — IRON,TIBC AND FERRITIN PANEL
%SAT: 24 % (calc) (ref 16–45)
Ferritin: 20 ng/mL (ref 16–154)
Iron: 84 ug/dL (ref 40–190)
TIBC: 353 mcg/dL (calc) (ref 250–450)

## 2019-01-23 ENCOUNTER — Encounter: Payer: Self-pay | Admitting: Family Medicine

## 2019-01-30 ENCOUNTER — Ambulatory Visit: Payer: Medicaid Other | Admitting: Orthopaedic Surgery

## 2019-02-03 ENCOUNTER — Other Ambulatory Visit: Payer: Medicaid Other | Admitting: Advanced Practice Midwife

## 2019-02-10 ENCOUNTER — Other Ambulatory Visit: Payer: Self-pay

## 2019-02-10 ENCOUNTER — Other Ambulatory Visit (HOSPITAL_COMMUNITY)
Admission: RE | Admit: 2019-02-10 | Discharge: 2019-02-10 | Disposition: A | Discharge status: Home / Self Care | Payer: Medicaid Other | Source: Ambulatory Visit | Attending: Advanced Practice Midwife | Admitting: Advanced Practice Midwife

## 2019-02-10 ENCOUNTER — Encounter: Payer: Self-pay | Admitting: Advanced Practice Midwife

## 2019-02-10 ENCOUNTER — Ambulatory Visit (INDEPENDENT_AMBULATORY_CARE_PROVIDER_SITE_OTHER): Payer: Medicaid Other | Admitting: Advanced Practice Midwife

## 2019-02-10 VITALS — BP 121/73 | HR 100 | Ht 66.0 in | Wt 184.0 lb

## 2019-02-10 DIAGNOSIS — Z01419 Encounter for gynecological examination (general) (routine) without abnormal findings: Secondary | ICD-10-CM

## 2019-02-10 DIAGNOSIS — Z0000 Encounter for general adult medical examination without abnormal findings: Secondary | ICD-10-CM | POA: Diagnosis not present

## 2019-02-10 MED ORDER — NORETHINDRONE 0.35 MG PO TABS: 1.0000 | ORAL_TABLET | Freq: Every day | ORAL | 11 refills | Status: DC

## 2019-02-10 NOTE — Progress Notes (Signed)
   WELL-WOMAN EXAMINATION Patient name: Kelly Villarreal MRN [id number]  Date of birth: 03-19-1990 Chief Complaint:   Gynecologic Exam  History of Present Illness:   SCOTTLYN MCHANEY is a 29 y.o. G80P1101 African American female being seen today for a routine well-woman exam.  Current complaints: breastfeeding her 82 month old son- going well; doing well on Micronor; no concerns.  PCP: Dr Kingsley Spittle      does not desire labs (PCP follows) No LMP recorded. (Menstrual status: Lactating). The current method of family planning is oral progesterone-only contraceptive.  Last pap Aug 2017. Results were: normal. H/O abnormal pap: No Last mammogram: n/a. Family h/o breast cancer: No Last colonoscopy: n/a. Family h/o colorectal cancer: Yes- mother dx at age 64/47 Review of Systems:   Pertinent items are noted in HPI Denies any headaches, blurred vision, fatigue, shortness of breath, chest pain, abdominal pain, abnormal vaginal discharge/itching/odor/irritation, problems with periods, bowel movements, urination, or intercourse unless otherwise stated above. Pertinent History Reviewed:  Reviewed past medical,surgical, social and family history.  Reviewed problem list, medications and allergies. Physical Assessment:   Vitals:   02/10/19 1516  BP: 121/73  Pulse: 100  Weight: 184 lb (83.5 kg)  Height: 5\' 6"  (1.676 m)  Body mass index is 29.7 kg/m.        Physical Examination:   General appearance - well appearing, and in no distress  Mental status - alert, oriented to person, place, and time  Psych:  She has a normal mood and affect  Skin - warm and dry, normal color, no suspicious lesions noted  Chest - effort normal, all lung fields clear to auscultation bilaterally  Heart - normal rate and regular rhythm  Neck:  midline trachea, no thyromegaly or nodules  Breasts - deferred due to lactating status  Abdomen - soft, nontender, nondistended, no masses or organomegaly  Pelvic - VULVA: normal  appearing vulva with no masses, tenderness or lesions  VAGINA: normal appearing vagina with normal color and discharge, no lesions  CERVIX: normal appearing cervix without discharge or lesions, no CMT  Thin prep pap is done with HR HPV cotesting  UTERUS: uterus is felt to be normal size, shape, consistency and nontender   ADNEXA: No adnexal masses or tenderness noted.  Extremities:  No swelling or varicosities noted  Chaperone: Amanda Rash    No results found for this or any previous visit (from the past 24 hour(s)).  Assessment & Plan:  1) Well-Woman Exam  2) Micronor for contraception> rec to call to switch to OCPs when she weans; rx renewed for one year  Labs/procedures today: Pap/GC/chlam  Mammogram age 57 or sooner if problems Colonoscopy age 7 (60yrs prior to mom's dx) or sooner if problems  No orders of the defined types were placed in this encounter.   Meds:  Meds ordered this encounter  Medications  . norethindrone (MICRONOR) 0.35 MG tablet    Sig: Take 1 tablet (0.35 mg total) by mouth daily.    Dispense:  1 Package    Refill:  11    Order Specific Question:   Supervising Provider    Answer:   4yr [[id number]]    Follow-up: Return in about 1 year (around 02/10/2020) for Physical.  02/12/2020 CNM 02/10/2019 3:50 PM

## 2019-02-11 ENCOUNTER — Ambulatory Visit: Payer: Medicaid Other | Admitting: Orthopaedic Surgery

## 2019-02-11 ENCOUNTER — Encounter: Payer: Self-pay | Admitting: Orthopaedic Surgery

## 2019-02-11 ENCOUNTER — Ambulatory Visit: Payer: Medicaid Other

## 2019-02-11 VITALS — BP 119/84 | HR 76 | Temp 96.8°F | Ht 66.0 in | Wt 182.0 lb

## 2019-02-11 DIAGNOSIS — G8929 Other chronic pain: Secondary | ICD-10-CM | POA: Diagnosis not present

## 2019-02-11 DIAGNOSIS — M25562 Pain in left knee: Secondary | ICD-10-CM | POA: Diagnosis not present

## 2019-02-11 DIAGNOSIS — M25561 Pain in right knee: Secondary | ICD-10-CM

## 2019-02-11 NOTE — Progress Notes (Signed)
   Subjective:    Patient ID: Kelly Villarreal, female    DOB: Jan 18, 1990, 29 y.o.   MRN: [id number]  HPI She began having bilateral knee pain after her baby boy was born in October.  He is doing well and so is she except for the knee pain.  She is breast feeding and cannot take oral medicine.  She has used a gel for the knees and it helps. She has no swelling.  She has had occasional giving way. She has no redness, no trauma.  She saw Dr. Jeanice Lim recently and I have reviewed her notes.  The patient is concerned that if the knees give way while carrying the baby, the baby could be hurt.   Review of Systems  Constitutional: Positive for activity change.  Musculoskeletal: Positive for gait problem.  All other systems reviewed and are negative.      Objective:   Physical Exam Vitals and nursing note reviewed.  Constitutional:      Appearance: She is well-developed.  HENT:     Head: Normocephalic and atraumatic.  Eyes:     Conjunctiva/sclera: Conjunctivae normal.     Pupils: Pupils are equal, round, and reactive to light.  Cardiovascular:     Rate and Rhythm: Normal rate and regular rhythm.  Pulmonary:     Effort: Pulmonary effort is normal.  Abdominal:     Palpations: Abdomen is soft.  Musculoskeletal:     Cervical back: Normal range of motion and neck supple.       Legs:  Skin:    General: Skin is warm and dry.  Neurological:     Mental Status: She is alert and oriented to person, place, and time.     Cranial Nerves: No cranial nerve deficit.     Motor: No abnormal muscle tone.     Coordination: Coordination normal.     Deep Tendon Reflexes: Reflexes are normal and symmetric. Reflexes normal.  Psychiatric:        Behavior: Behavior normal.        Thought Content: Thought content normal.        Judgment: Judgment normal.    X-rays were done of both knees, reported separately.  They were negative.       Assessment & Plan:   Encounter Diagnoses  Name Primary?  .  Chronic pain of left knee Yes  . Chronic pain of right knee    I told her to continue the gel she is using.  I cannot give her any other medicine.  I will see her in one month. She may need MRI of the knee.  Sometimes after pregnancy joint pain may develop.  It usually resolves after stopping breast feeding.  I did not ask her to stop breast feeding, quite the opposite.  Call if any problem.  Precautions discussed.   Electronically Signed Darreld Mclean, MD 1/26/20214:06 PM

## 2019-02-11 NOTE — Patient Instructions (Signed)
Heat on your knees then Use Aspercreme, Biofreeze or Voltaren gel over the counter 2-3 times daily make sure you rub it in well each time you use it. Then you can apply ice to the knees, after

## 2019-02-13 LAB — CYTOLOGY - PAP
Chlamydia: NEGATIVE
Comment: NEGATIVE
Comment: NEGATIVE
Comment: NORMAL
Diagnosis: NEGATIVE
High risk HPV: NEGATIVE
Neisseria Gonorrhea: NEGATIVE

## 2019-03-13 ENCOUNTER — Ambulatory Visit: Payer: Medicaid Other | Admitting: Orthopaedic Surgery

## 2019-03-13 ENCOUNTER — Other Ambulatory Visit: Payer: Self-pay

## 2019-03-13 ENCOUNTER — Encounter: Payer: Self-pay | Admitting: Orthopaedic Surgery

## 2019-03-13 VITALS — BP 122/86 | HR 84 | Temp 97.2°F | Ht 66.0 in | Wt 180.8 lb

## 2019-03-13 DIAGNOSIS — G8929 Other chronic pain: Secondary | ICD-10-CM

## 2019-03-13 DIAGNOSIS — M25561 Pain in right knee: Secondary | ICD-10-CM | POA: Diagnosis not present

## 2019-03-13 DIAGNOSIS — M25562 Pain in left knee: Secondary | ICD-10-CM | POA: Diagnosis not present

## 2019-03-13 NOTE — Progress Notes (Signed)
Patient Kelly Villarreal, female DOB:10-25-1990, 29 y.o. MGQ:[id number]  Chief Complaint  Patient presents with  . Knee Pain    follow up from pain in both knees is a little better than before    HPI  Kelly Villarreal is a 29 y.o. female who has bilateral knee pain.  She is getting better.  She has less pain now and no swelling. She is using the rubs.  She has no giving way. She feels so much better.   Body mass index is 29.18 kg/m.  ROS  Review of Systems  Constitutional: Positive for activity change.  Musculoskeletal: Positive for gait problem.  All other systems reviewed and are negative.   All other systems reviewed and are negative.  The following is a summary of the past history medically, past history surgically, known current medicines, social history and family history.  This information is gathered electronically by the computer from prior information and documentation.  I review this each visit and have found including this information at this point in the chart is beneficial and informative.    Past Medical History:  Diagnosis Date  . Allergy    seasonal  . Anxiety 03/08/2011  . Depression 03/08/2011  . Nausea and vomiting during pregnancy 08/10/2014  . Threatened miscarriage in early pregnancy 08/10/2014  . Vaginal bleeding in pregnancy 08/10/2014    Past Surgical History:  Procedure Laterality Date  . CESAREAN SECTION N/A 12/16/2014   Procedure: CESAREAN SECTION;  Surgeon: Woodroe Mode, MD;  Location: [facility];  Service: Obstetrics;  Laterality: N/A;  . CESAREAN SECTION N/A 10/20/2018   Procedure: REPEAT CESAREAN SECTION;  Surgeon: Aletha Halim, MD;  Location: [facility];  Service: Obstetrics;  Laterality: N/A;  . MOUTH SURGERY      Family History  Problem Relation Age of Onset  . Colon cancer Mother   . Hypertension Father   . Heart disease Maternal Grandmother   . Glaucoma Paternal Grandmother   . Diabetes Maternal Grandfather   . Hypertension  Maternal Grandfather   . Hypertension Paternal Aunt   . Diabetes Paternal Aunt   . Diabetes Paternal Uncle   . Hypertension Paternal Uncle     Social History Social History   Tobacco Use  . Smoking status: Never Smoker  . Smokeless tobacco: Never Used  Substance Use Topics  . Alcohol use: Not Currently    Comment: wine   . Drug use: No    Types: Marijuana    No Known Allergies  Current Outpatient Medications  Medication Sig Dispense Refill  . diclofenac Sodium (VOLTAREN) 1 % GEL Apply to knees tid prn 100 g 1  . norethindrone (MICRONOR) 0.35 MG tablet Take 1 tablet (0.35 mg total) by mouth daily. 1 Package 11  . Prenatal MV & Min w/FA-DHA (PRENATAL ADULT GUMMY/DHA/FA PO) Take 1 tablet by mouth daily.     No current facility-administered medications for this visit.     Physical Exam  Blood pressure 122/86, pulse 84, temperature (!) 97.2 F (36.2 C), height 5\' 6"  (1.676 m), weight 180 lb 12.8 oz (82 kg), currently breastfeeding.  Constitutional: overall normal hygiene, normal nutrition, well developed, normal grooming, normal body habitus. Assistive device:none  Musculoskeletal: gait and station Limp none, muscle tone and strength are normal, no tremors or atrophy is present.  .  Neurological: coordination overall normal.  Deep tendon reflex/nerve stretch intact.  Sensation normal.  Cranial nerves II-XII intact.   Skin:   Normal overall no scars, lesions,  ulcers or rashes. No psoriasis.  Psychiatric: Alert and oriented x 3.  Recent memory intact, remote memory unclear.  Normal mood and affect. Well groomed.  Good eye contact.  Cardiovascular: overall no swelling, no varicosities, no edema bilaterally, normal temperatures of the legs and arms, no clubbing, cyanosis and good capillary refill.  Lymphatic: palpation is normal.  Gait is normal.  Knees full ROM and no effusion.  All other systems reviewed and are negative   The patient has been educated about the  nature of the problem(s) and counseled on treatment options.  The patient appeared to understand what I have discussed and is in agreement with it.  Encounter Diagnoses  Name Primary?  . Chronic pain of right knee Yes  . Chronic pain of left knee     PLAN Call if any problems.  Precautions discussed.  Continue current medications.   Return to clinic 6 weeks   I do not feel she need MRI.  If she continues to improved, she can cancel next visit.  Electronically Signed Darreld Mclean, MD 2/25/202111:06 AM

## 2019-04-24 ENCOUNTER — Encounter: Payer: Self-pay | Admitting: Orthopaedic Surgery

## 2019-04-24 ENCOUNTER — Ambulatory Visit: Payer: Medicaid Other | Admitting: Orthopaedic Surgery

## 2019-07-03 DIAGNOSIS — H5213 Myopia, bilateral: Secondary | ICD-10-CM | POA: Diagnosis not present

## 2019-07-08 ENCOUNTER — Ambulatory Visit: Payer: Medicaid Other | Admitting: Orthopaedic Surgery

## 2019-07-15 ENCOUNTER — Encounter: Payer: Self-pay | Admitting: Orthopaedic Surgery

## 2019-07-15 ENCOUNTER — Other Ambulatory Visit: Payer: Self-pay

## 2019-07-15 ENCOUNTER — Ambulatory Visit: Payer: Medicaid Other | Admitting: Orthopaedic Surgery

## 2019-07-15 VITALS — BP 126/79 | HR 86 | Ht 66.0 in | Wt 178.0 lb

## 2019-07-15 DIAGNOSIS — G8929 Other chronic pain: Secondary | ICD-10-CM | POA: Diagnosis not present

## 2019-07-15 DIAGNOSIS — M25562 Pain in left knee: Secondary | ICD-10-CM | POA: Diagnosis not present

## 2019-07-15 NOTE — Progress Notes (Signed)
Patient QA:ESLP Kelly Villarreal, female DOB:Oct 13, 1990, 29 y.o. NPY:[id number]  Chief Complaint  Patient presents with  . Knee Pain    Bilateral, feet swelling    HPI  Kelly Villarreal is a 29 y.o. female who has more pain of the left knee with giving way now and swelling and popping.  She has no new trauma.  I am concerned about meniscus tear.  I would like to get MRI of the left knee.   Body mass index is 28.73 kg/m.  ROS  Review of Systems  Constitutional: Positive for activity change.  Musculoskeletal: Positive for gait problem.  All other systems reviewed and are negative.   All other systems reviewed and are negative.  The following is a summary of the past history medically, past history surgically, known current medicines, social history and family history.  This information is gathered electronically by the computer from prior information and documentation.  I review this each visit and have found including this information at this point in the chart is beneficial and informative.    Past Medical History:  Diagnosis Date  . Allergy    seasonal  . Anxiety 03/08/2011  . Depression 03/08/2011  . Nausea and vomiting during pregnancy 08/10/2014  . Threatened miscarriage in early pregnancy 08/10/2014  . Vaginal bleeding in pregnancy 08/10/2014    Past Surgical History:  Procedure Laterality Date  . CESAREAN SECTION N/A 12/16/2014   Procedure: CESAREAN SECTION;  Surgeon: Adam Phenix, MD;  Location: [facility];  Service: Obstetrics;  Laterality: N/A;  . CESAREAN SECTION N/A 10/20/2018   Procedure: REPEAT CESAREAN SECTION;  Surgeon: Ruskin Bing, MD;  Location: [facility];  Service: Obstetrics;  Laterality: N/A;  . MOUTH SURGERY      Family History  Problem Relation Age of Onset  . Colon cancer Mother   . Hypertension Father   . Heart disease Maternal Grandmother   . Glaucoma Paternal Grandmother   . Diabetes Maternal Grandfather   . Hypertension Maternal Grandfather   .  Hypertension Paternal Aunt   . Diabetes Paternal Aunt   . Diabetes Paternal Uncle   . Hypertension Paternal Uncle     Social History Social History   Tobacco Use  . Smoking status: Never Smoker  . Smokeless tobacco: Never Used  Vaping Use  . Vaping Use: Never used  Substance Use Topics  . Alcohol use: Not Currently    Comment: wine   . Drug use: No    Types: Marijuana    No Known Allergies  Current Outpatient Medications  Medication Sig Dispense Refill  . diclofenac Sodium (VOLTAREN) 1 % GEL Apply to knees tid prn 100 g 1  . norethindrone (MICRONOR) 0.35 MG tablet Take 1 tablet (0.35 mg total) by mouth daily. 1 Package 11  . Prenatal MV & Min w/FA-DHA (PRENATAL ADULT GUMMY/DHA/FA PO) Take 1 tablet by mouth daily.     No current facility-administered medications for this visit.     Physical Exam  Blood pressure 126/79, pulse 86, height 5\' 6"  (1.676 m), weight 178 lb (80.7 kg), currently breastfeeding.  Constitutional: overall normal hygiene, normal nutrition, well developed, normal grooming, normal body habitus. Assistive device:none  Musculoskeletal: gait and station Limp left, muscle tone and strength are normal, no tremors or atrophy is present.  .  Neurological: coordination overall normal.  Deep tendon reflex/nerve stretch intact.  Sensation normal.  Cranial nerves II-XII intact.   Skin:   Normal overall no scars, lesions, ulcers or rashes. No  psoriasis.  Psychiatric: Alert and oriented x 3.  Recent memory intact, remote memory unclear.  Normal mood and affect. Well groomed.  Good eye contact.  Cardiovascular: overall no swelling, no varicosities, no edema bilaterally, normal temperatures of the legs and arms, no clubbing, cyanosis and good capillary refill.  Lymphatic: palpation is normal.  Left knee has effusion, crepitus, ROM 0 to 105, positive medial McMurray, limp left, NV intact.  All other systems reviewed and are negative   The patient has been  educated about the nature of the problem(s) and counseled on treatment options.  The patient appeared to understand what I have discussed and is in agreement with it.  Encounter Diagnosis  Name Primary?  . Chronic pain of left knee Yes    PLAN Call if any problems.  Precautions discussed.  Continue current medications.   Return to clinic 2 weeks   Get MRI of the left knee.  Electronically Signed Darreld Mclean, MD 6/29/20213:16 PM

## 2019-07-15 NOTE — Progress Notes (Signed)
mri

## 2019-07-17 ENCOUNTER — Ambulatory Visit (HOSPITAL_COMMUNITY): Admission: RE | Admit: 2019-07-17 | Payer: Medicaid Other | Source: Ambulatory Visit

## 2019-07-28 DIAGNOSIS — H52223 Regular astigmatism, bilateral: Secondary | ICD-10-CM | POA: Diagnosis not present

## 2019-07-28 DIAGNOSIS — H5213 Myopia, bilateral: Secondary | ICD-10-CM | POA: Diagnosis not present

## 2019-07-29 ENCOUNTER — Ambulatory Visit: Payer: Medicaid Other | Admitting: Family Medicine

## 2019-07-31 ENCOUNTER — Ambulatory Visit: Payer: Medicaid Other | Admitting: Orthopaedic Surgery

## 2019-07-31 ENCOUNTER — Ambulatory Visit (INDEPENDENT_AMBULATORY_CARE_PROVIDER_SITE_OTHER): Payer: Medicaid Other | Admitting: Nurse Practitioner

## 2019-07-31 ENCOUNTER — Other Ambulatory Visit: Payer: Self-pay

## 2019-07-31 VITALS — BP 114/70 | HR 86 | Temp 98.8°F | Resp 18 | Wt 178.0 lb

## 2019-07-31 DIAGNOSIS — W57XXXA Bitten or stung by nonvenomous insect and other nonvenomous arthropods, initial encounter: Secondary | ICD-10-CM | POA: Diagnosis not present

## 2019-07-31 DIAGNOSIS — L239 Allergic contact dermatitis, unspecified cause: Secondary | ICD-10-CM | POA: Diagnosis not present

## 2019-07-31 NOTE — Progress Notes (Signed)
Established Patient Office Visit  Subjective:  Patient ID: Kelly Villarreal, female    DOB: 12/11/1990  Age: 29 y.o. MRN: [id number]  CC: I had insect bite that caused itching and swelling now it is better  HPI [facility] [name] is a 29 year old female that presents to clinic with reports of insect bite on the back of her neck last Tuesday, to her right thigh and left hand this Wednesday. She is not sure of the insect that is biting her but she thinks that it is mosquitos as the skin redness and swelling along with the itching is like prior bites from that insect. Today her sxs have resolved. She reports that she used benadryl to help the sxs to resolve. She came to the appt today as she was concerned about her reaction and wanted to know for future reactions if there might be other treatment plans.     Past Medical History:  Diagnosis Date  . Allergy    seasonal  . Anxiety 03/08/2011  . Depression 03/08/2011  . Nausea and vomiting during pregnancy 08/10/2014  . Threatened miscarriage in early pregnancy 08/10/2014  . Vaginal bleeding in pregnancy 08/10/2014    Past Surgical History:  Procedure Laterality Date  . CESAREAN SECTION N/A 12/16/2014   Procedure: CESAREAN SECTION;  Surgeon: Kelly Phenix, MD;  Location: [facility];  Service: Obstetrics;  Laterality: N/A;  . CESAREAN SECTION N/A 10/20/2018   Procedure: REPEAT CESAREAN SECTION;  Surgeon: [place] Bing, MD;  Location: [facility];  Service: Obstetrics;  Laterality: N/A;  . MOUTH SURGERY      Family History  Problem Relation Age of Onset  . Colon cancer Mother   . Hypertension Father   . Heart disease Maternal Grandmother   . Glaucoma Paternal Grandmother   . Diabetes Maternal Grandfather   . Hypertension Maternal Grandfather   . Hypertension Paternal Aunt   . Diabetes Paternal Aunt   . Diabetes Paternal Uncle   . Hypertension Paternal Uncle     Social History   Socioeconomic History  . Marital status: Married    Spouse  name: Kelly Villarreal  . Number of children: 1  . Years of education: Not on file  . Highest education level: Not on file  Occupational History  . Not on file  Tobacco Use  . Smoking status: Never Smoker  . Smokeless tobacco: Never Used  Vaping Use  . Vaping Use: Never used  Substance and Sexual Activity  . Alcohol use: Not Currently    Comment: wine   . Drug use: No    Types: Marijuana  . Sexual activity: Not Currently    Partners: Male    Birth control/protection: None  Other Topics Concern  . Not on file  Social History Narrative  . Not on file   Social Determinants of Health   Financial Resource Strain: Low Risk   . Difficulty of Paying Living Expenses: Not hard at all  Food Insecurity: No Food Insecurity  . Worried About Programme researcher, broadcasting/film/video in the Last Year: Never true  . Ran Out of Food in the Last Year: Never true  Transportation Needs: No Transportation Needs  . Lack of Transportation (Medical): No  . Lack of Transportation (Non-Medical): No  Physical Activity:   . Days of Exercise per Week:   . Minutes of Exercise per Session:   Stress:   . Feeling of Stress :   Social Connections:   . Frequency of Communication with  Friends and Family:   . Frequency of Social Gatherings with Friends and Family:   . Attends Religious Services:   . Active Member of Clubs or Organizations:   . Attends Banker Meetings:   Marland Kitchen Marital Status:   Intimate Partner Violence:   . Fear of Current or Ex-Partner:   . Emotionally Abused:   Marland Kitchen Physically Abused:   . Sexually Abused:     Outpatient Medications Prior to Visit  Medication Sig Dispense Refill  . diclofenac Sodium (VOLTAREN) 1 % GEL Apply to knees tid prn 100 g 1  . norethindrone (MICRONOR) 0.35 MG tablet Take 1 tablet (0.35 mg total) by mouth daily. 1 Package 11  . Prenatal MV & Min w/FA-DHA (PRENATAL ADULT GUMMY/DHA/FA PO) Take 1 tablet by mouth daily.     No facility-administered medications prior to visit.     No Known Allergies  ROS Review of Systems  All other systems reviewed and are negative.     Objective:    Physical Exam Vitals and nursing note reviewed.  Constitutional:      Appearance: Normal appearance. She is well-developed and well-groomed. She is not ill-appearing, toxic-appearing or diaphoretic.  HENT:     Head: Normocephalic.     Right Ear: Hearing, ear canal and external ear normal.     Left Ear: Hearing, ear canal and external ear normal.  Eyes:     General: Lids are normal. Lids are everted, no foreign bodies appreciated.     Extraocular Movements:     Right eye: Normal extraocular motion.     Left eye: Normal extraocular motion.     Conjunctiva/sclera: Conjunctivae normal.     Pupils: Pupils are equal, round, and reactive to light. Pupils are equal.  Neck:     Vascular: No JVD.  Cardiovascular:     Rate and Rhythm: Normal rate.  Pulmonary:     Effort: Pulmonary effort is normal.  Abdominal:     General: Abdomen is flat.     Tenderness: There is no guarding.  Musculoskeletal:        General: No swelling. Normal range of motion.     Cervical back: Full passive range of motion without pain, normal range of motion and neck supple.     Right lower leg: No edema.     Left lower leg: No edema.  Skin:    General: Skin is warm and dry.     Coloration: Skin is not jaundiced.     Findings: No bruising, erythema or rash.  Neurological:     General: No focal deficit present.     Mental Status: She is alert and oriented to person, place, and time.     GCS: GCS eye subscore is 4. GCS verbal subscore is 5. GCS motor subscore is 6.  Psychiatric:        Attention and Perception: Attention and perception normal.        Mood and Affect: Mood and affect normal.        Speech: Speech normal.        Behavior: Behavior normal. Behavior is cooperative.        Thought Content: Thought content normal.        Cognition and Memory: Cognition normal.        Judgment:  Judgment normal.     BP 114/70 (BP Location: Right Arm, Patient Position: Sitting, Cuff Size: Normal)   Pulse 86   Temp 98.8 F (37.1 C) (Temporal)  Resp 18   Wt 178 lb (80.7 kg)   SpO2 98%   BMI 28.73 kg/m  Wt Readings from Last 3 Encounters:  07/31/19 178 lb (80.7 kg)  07/15/19 178 lb (80.7 kg)  03/13/19 180 lb 12.8 oz (82 kg)     Health Maintenance Due  Topic Date Due  . Hepatitis C Screening  Never done    There are no preventive care reminders to display for this patient.  No results found for: TSH Lab Results  Component Value Date   WBC 7.6 01/21/2019   HGB 13.1 01/21/2019   HCT 40.0 01/21/2019   MCV 89.9 01/21/2019   PLT 320 01/21/2019   Lab Results  Component Value Date   NA 137 10/18/2018   K 3.7 10/18/2018   CO2 21 (L) 10/18/2018   GLUCOSE 72 10/18/2018   BUN <5 (L) 10/18/2018   CREATININE 0.54 10/18/2018   BILITOT 0.1 (L) 10/18/2018   ALKPHOS 99 10/18/2018   AST 17 10/18/2018   ALT 13 10/18/2018   PROT 5.6 (L) 10/18/2018   ALBUMIN 2.5 (L) 10/18/2018   CALCIUM 8.9 10/18/2018   ANIONGAP 7 10/18/2018   No results found for: CHOL No results found for: HDL No results found for: LDLCALC No results found for: TRIG No results found for: CHOLHDL No results found for: KVQQ5Z    Assessment & Plan:   Problem List Items Addressed This Visit    None    Visit Diagnoses    Insect bite, unspecified site, initial encounter    -  Primary   Allergic dermatitis        For insect bite and allergic reaction you may use over the counter antihistamine non drowsy for daytime such as Claritin and bedtime Benadryl. You may also use H2 antihistamine blocker Pepcid over the counter. You may use topical steroid cream for limited affected areas. Ice may help to reduce inflammation. For reactions that do not decrease with this treatment seek medical attention. For shortness of breath or swelling of the throat call 911 or seek urgent medical attention or go to the ER>    No orders of the defined types were placed in this encounter.   Follow-up: Return if symptoms worsen or fail to improve.    Elmore Guise, FNP

## 2019-08-15 ENCOUNTER — Ambulatory Visit (HOSPITAL_COMMUNITY)
Admission: RE | Admit: 2019-08-15 | Discharge: 2019-08-15 | Disposition: A | Discharge status: Home / Self Care | Payer: Medicaid Other | Source: Ambulatory Visit | Attending: Orthopaedic Surgery | Admitting: Orthopaedic Surgery

## 2019-08-15 ENCOUNTER — Other Ambulatory Visit: Payer: Self-pay

## 2019-08-15 DIAGNOSIS — M25562 Pain in left knee: Secondary | ICD-10-CM | POA: Diagnosis not present

## 2019-08-15 DIAGNOSIS — M25462 Effusion, left knee: Secondary | ICD-10-CM | POA: Diagnosis not present

## 2019-08-15 DIAGNOSIS — R600 Localized edema: Secondary | ICD-10-CM | POA: Diagnosis not present

## 2019-08-15 DIAGNOSIS — G8929 Other chronic pain: Secondary | ICD-10-CM | POA: Diagnosis not present

## 2019-08-19 ENCOUNTER — Ambulatory Visit: Payer: Medicaid Other | Admitting: Orthopaedic Surgery

## 2019-08-19 ENCOUNTER — Other Ambulatory Visit: Payer: Self-pay

## 2019-08-19 ENCOUNTER — Encounter: Payer: Self-pay | Admitting: Orthopaedic Surgery

## 2019-08-19 VITALS — BP 134/83 | HR 93 | Ht 66.0 in | Wt 178.0 lb

## 2019-08-19 DIAGNOSIS — G8929 Other chronic pain: Secondary | ICD-10-CM

## 2019-08-19 DIAGNOSIS — M25562 Pain in left knee: Secondary | ICD-10-CM

## 2019-08-19 MED ORDER — NAPROXEN 500 MG PO TABS: 500.0000 mg | ORAL_TABLET | Freq: Two times a day (BID) | ORAL | 5 refills | Status: DC

## 2019-08-19 NOTE — Progress Notes (Signed)
Patient IH:KVQQ Kelly Villarreal, female DOB:01-29-1990, 29 y.o. VZD:[id number]  Chief Complaint  Patient presents with  . Knee Pain    left     HPI  Kelly Villarreal is a 29 y.o. female who has left knee pain.  She had the MRI done and it showed: IMPRESSION: 1. Moderate to high-grade cartilage loss along the inferior lateral patellar facet with underlying subchondral marrow signal changes. 2. Prominent soft tissue edema within the superolateral aspect of Hoffa's fat pad, which can be seen in the setting of patellar tendon-lateral femoral condyle friction syndrome. 3. Mild pes anserine bursal edema. 4. Intact menisci, cruciate, and collateral ligaments.  I have independently reviewed the MRI.     I have explained the findings to her.  She does not need surgery however this will take time to get better.  I will begin Naprosyn 500 po bid pc.     Body mass index is 28.73 kg/m.  ROS  Review of Systems  Constitutional: Positive for activity change.  Musculoskeletal: Positive for gait problem.  All other systems reviewed and are negative.   All other systems reviewed and are negative.  The following is a summary of the past history medically, past history surgically, known current medicines, social history and family history.  This information is gathered electronically by the computer from prior information and documentation.  I review this each visit and have found including this information at this point in the chart is beneficial and informative.    Past Medical History:  Diagnosis Date  . Allergy    seasonal  . Anxiety 03/08/2011  . Depression 03/08/2011  . Nausea and vomiting during pregnancy 08/10/2014  . Threatened miscarriage in early pregnancy 08/10/2014  . Vaginal bleeding in pregnancy 08/10/2014    Past Surgical History:  Procedure Laterality Date  . CESAREAN SECTION N/A 12/16/2014   Procedure: CESAREAN SECTION;  Surgeon: Adam Phenix, MD;  Location: [facility];  Service:  Obstetrics;  Laterality: N/A;  . CESAREAN SECTION N/A 10/20/2018   Procedure: REPEAT CESAREAN SECTION;  Surgeon: [place] Bing, MD;  Location: [facility];  Service: Obstetrics;  Laterality: N/A;  . MOUTH SURGERY      Family History  Problem Relation Age of Onset  . Colon cancer Mother   . Hypertension Father   . Heart disease Maternal Grandmother   . Glaucoma Paternal Grandmother   . Diabetes Maternal Grandfather   . Hypertension Maternal Grandfather   . Hypertension Paternal Aunt   . Diabetes Paternal Aunt   . Diabetes Paternal Uncle   . Hypertension Paternal Uncle     Social History Social History   Tobacco Use  . Smoking status: Never Smoker  . Smokeless tobacco: Never Used  Vaping Use  . Vaping Use: Never used  Substance Use Topics  . Alcohol use: Not Currently    Comment: wine   . Drug use: No    Types: Marijuana    No Known Allergies  Current Outpatient Medications  Medication Sig Dispense Refill  . diclofenac Sodium (VOLTAREN) 1 % GEL Apply to knees tid prn 100 g 1  . norethindrone (MICRONOR) 0.35 MG tablet Take 1 tablet (0.35 mg total) by mouth daily. 1 Package 11  . Prenatal MV & Min w/FA-DHA (PRENATAL ADULT GUMMY/DHA/FA PO) Take 1 tablet by mouth daily.    . naproxen (NAPROSYN) 500 MG tablet Take 1 tablet (500 mg total) by mouth 2 (two) times daily with a meal. 60 tablet 5   No  current facility-administered medications for this visit.     Physical Exam  Blood pressure 134/83, pulse 93, height 5\' 6"  (1.676 m), weight 178 lb (80.7 kg), last menstrual period 08/02/2019, unknown if currently breastfeeding.  Constitutional: overall normal hygiene, normal nutrition, well developed, normal grooming, normal body habitus. Assistive device:none  Musculoskeletal: gait and station Limp left, muscle tone and strength are normal, no tremors or atrophy is present.  .  Neurological: coordination overall normal.  Deep tendon reflex/nerve stretch intact.  Sensation  normal.  Cranial nerves II-XII intact.   Skin:   Normal overall no scars, lesions, ulcers or rashes. No psoriasis.  Psychiatric: Alert and oriented x 3.  Recent memory intact, remote memory unclear.  Normal mood and affect. Well groomed.  Good eye contact.  Cardiovascular: overall no swelling, no varicosities, no edema bilaterally, normal temperatures of the legs and arms, no clubbing, cyanosis and good capillary refill.  Lymphatic: palpation is normal. . Left knee has good ROM but pain of the patella area.  NV intact.  Gait is good with slight left limp.  All other systems reviewed and are negative   The patient has been educated about the nature of the problem(s) and counseled on treatment options.  The patient appeared to understand what I have discussed and is in agreement with it.  Encounter Diagnosis  Name Primary?  . Chronic pain of left knee Yes    PLAN Call if any problems.  Precautions discussed.  Continue current medications. Begin Naprosyn.   Return to clinic 1 month   Electronically Signed 08/04/2019, MD 8/3/202110:53 AM

## 2019-08-26 ENCOUNTER — Ambulatory Visit: Payer: Medicaid Other | Admitting: Orthopedic Surgery

## 2019-09-18 ENCOUNTER — Ambulatory Visit: Payer: Medicaid Other | Admitting: Orthopaedic Surgery

## 2019-09-23 ENCOUNTER — Other Ambulatory Visit: Payer: Self-pay

## 2019-09-23 ENCOUNTER — Encounter: Payer: Self-pay | Admitting: Orthopaedic Surgery

## 2019-09-23 ENCOUNTER — Ambulatory Visit (INDEPENDENT_AMBULATORY_CARE_PROVIDER_SITE_OTHER): Payer: Medicaid Other | Admitting: Orthopaedic Surgery

## 2019-09-23 VITALS — BP 128/86 | HR 83 | Ht 66.0 in | Wt 182.0 lb

## 2019-09-23 DIAGNOSIS — M25562 Pain in left knee: Secondary | ICD-10-CM

## 2019-09-23 DIAGNOSIS — G8929 Other chronic pain: Secondary | ICD-10-CM | POA: Diagnosis not present

## 2019-09-23 NOTE — Progress Notes (Signed)
Patient Kelly Villarreal, female DOB:[birth year], 29 y.o. [id number]  Chief Complaint  Patient presents with  . Knee Pain    HPI  Kelly Villarreal is a 29 y.o. female who has patella pain on the left.  It has good and bad days. She takes Naprosyn and uses the Voltaren Gel.  She has no new trauma.  She has no redness.   Body mass index is 29.38 kg/m.  ROS  Review of Systems  Constitutional: Positive for activity change.  Musculoskeletal: Positive for gait problem.  All other systems reviewed and are negative.   All other systems reviewed and are negative.  The following is a summary of the past history medically, past history surgically, known current medicines, social history and family history.  This information is gathered electronically by the computer from prior information and documentation.  I review this each visit and have found including this information at this point in the chart is beneficial and informative.    Past Medical History:  Diagnosis Date  . Allergy    seasonal  . Anxiety 03/08/2011  . Depression 03/08/2011  . Nausea and vomiting during pregnancy 08/10/2014  . Threatened miscarriage in early pregnancy 08/10/2014  . Vaginal bleeding in pregnancy 08/10/2014    Past Surgical History:  Procedure Laterality Date  . CESAREAN SECTION N/A 12/16/2014   Procedure: CESAREAN SECTION;  Surgeon: Adam Phenix, MD;  Location: [facility];  Service: Obstetrics;  Laterality: N/A;  . CESAREAN SECTION N/A 10/20/2018   Procedure: REPEAT CESAREAN SECTION;  Surgeon: [place] Bing, MD;  Location: [facility];  Service: Obstetrics;  Laterality: N/A;  . MOUTH SURGERY      Family History  Problem Relation Age of Onset  . Colon cancer Mother   . Hypertension Father   . Heart disease Maternal Grandmother   . Glaucoma Paternal Grandmother   . Diabetes Maternal Grandfather   . Hypertension Maternal Grandfather   . Hypertension Paternal Aunt   . Diabetes Paternal Aunt   .  Diabetes Paternal Uncle   . Hypertension Paternal Uncle     Social History Social History   Tobacco Use  . Smoking status: Never Smoker  . Smokeless tobacco: Never Used  Vaping Use  . Vaping Use: Never used  Substance Use Topics  . Alcohol use: Not Currently    Comment: wine   . Drug use: No    Types: Marijuana    No Known Allergies  Current Outpatient Medications  Medication Sig Dispense Refill  . diclofenac Sodium (VOLTAREN) 1 % GEL Apply to knees tid prn 100 g 1  . naproxen (NAPROSYN) 500 MG tablet Take 1 tablet (500 mg total) by mouth 2 (two) times daily with a meal. 60 tablet 5  . norethindrone (MICRONOR) 0.35 MG tablet Take 1 tablet (0.35 mg total) by mouth daily. 1 Package 11  . Prenatal MV & Min w/FA-DHA (PRENATAL ADULT GUMMY/DHA/FA PO) Take 1 tablet by mouth daily.     No current facility-administered medications for this visit.     Physical Exam  Blood pressure 128/86, pulse 83, height 5\' 6"  (1.676 m), weight 182 lb (82.6 kg), unknown if currently breastfeeding.  Constitutional: overall normal hygiene, normal nutrition, well developed, normal grooming, normal body habitus. Assistive device:none  Musculoskeletal: gait and station Limp left, muscle tone and strength are normal, no tremors or atrophy is present.  .  Neurological: coordination overall normal.  Deep tendon reflex/nerve stretch intact.  Sensation normal.  Cranial nerves II-XII intact.  Skin:   Normal overall no scars, lesions, ulcers or rashes. No psoriasis.  Psychiatric: Alert and oriented x 3.  Recent memory intact, remote memory unclear.  Normal mood and affect. Well groomed.  Good eye contact.  Cardiovascular: overall no swelling, no varicosities, no edema bilaterally, normal temperatures of the legs and arms, no clubbing, cyanosis and good capillary refill.  Lymphatic: palpation is normal.  Left knee no effusion, crepitus, patella pain, ROM full.  All other systems reviewed and are  negative   The patient has been educated about the nature of the problem(s) and counseled on treatment options.  The patient appeared to understand what I have discussed and is in agreement with it.  Encounter Diagnosis  Name Primary?  . Chronic pain of left knee Yes    PLAN Call if any problems.  Precautions discussed.  Continue current medications.   Return to clinic 2 months   Electronically Signed Darreld Mclean, MD 9/7/202110:57 AM

## 2019-10-07 ENCOUNTER — Ambulatory Visit: Payer: Medicaid Other | Admitting: Orthopaedic Surgery

## 2019-11-05 ENCOUNTER — Ambulatory Visit
Admission: RE | Admit: 2019-11-05 | Discharge: 2019-11-05 | Disposition: A | Discharge status: Home / Self Care | Payer: Medicaid Other | Source: Ambulatory Visit | Attending: Emergency Medicine | Admitting: Emergency Medicine

## 2019-11-05 ENCOUNTER — Other Ambulatory Visit: Payer: Self-pay

## 2019-11-05 VITALS — BP 127/85 | HR 91 | Temp 99.4°F | Resp 17

## 2019-11-05 DIAGNOSIS — H109 Unspecified conjunctivitis: Secondary | ICD-10-CM | POA: Diagnosis not present

## 2019-11-05 MED ORDER — KETOTIFEN FUMARATE 0.025 % OP SOLN: 1.0000 [drp] | Freq: Two times a day (BID) | OPHTHALMIC | 0 refills | Status: DC

## 2019-11-05 MED ORDER — POLYMYXIN B-TRIMETHOPRIM 10000-0.1 UNIT/ML-% OP SOLN: OPHTHALMIC | 0 refills | Status: DC

## 2019-11-05 NOTE — ED Provider Notes (Signed)
[facility]   [id number] 11/05/19 Arrival Time: 1749  CC: Red eye  SUBJECTIVE:  Kelly Villarreal is a 29 y.o. female who presents with complaint of eye redness, irritation, and drainage that began this morning.  Denies precipitating event or trauma.  Denies alleviating or aggravating factors.  Reports similar symptoms in the past with allergies.  Denies fever, chills, nausea, vomiting, eye pain, painful eye movements, itching, vision changes, double vision, FB sensation, periorbital erythema.     Denies contact lens use.    ROS: As per HPI.  All other pertinent ROS negative.     Past Medical History:  Diagnosis Date  . Allergy    seasonal  . Anxiety 03/08/2011  . Depression 03/08/2011  . Nausea and vomiting during pregnancy 08/10/2014  . Threatened miscarriage in early pregnancy 08/10/2014  . Vaginal bleeding in pregnancy 08/10/2014   Past Surgical History:  Procedure Laterality Date  . CESAREAN SECTION N/A 12/16/2014   Procedure: CESAREAN SECTION;  Surgeon: Adam Phenix, MD;  Location: [facility];  Service: Obstetrics;  Laterality: N/A;  . CESAREAN SECTION N/A 10/20/2018   Procedure: REPEAT CESAREAN SECTION;  Surgeon: [place] Bing, MD;  Location: [facility];  Service: Obstetrics;  Laterality: N/A;  . MOUTH SURGERY     No Known Allergies No current facility-administered medications on file prior to encounter.   Current Outpatient Medications on File Prior to Encounter  Medication Sig Dispense Refill  . diclofenac Sodium (VOLTAREN) 1 % GEL Apply to knees tid prn 100 g 1  . norethindrone (MICRONOR) 0.35 MG tablet Take 1 tablet (0.35 mg total) by mouth daily. 1 Package 11  . Prenatal MV & Min w/FA-DHA (PRENATAL ADULT GUMMY/DHA/FA PO) Take 1 tablet by mouth daily.     Social History   Socioeconomic History  . Marital status: Married    Spouse name: Kelly Villarreal  . Number of children: 1  . Years of education: Not on file  . Highest education level: Not on file  Occupational  History  . Not on file  Tobacco Use  . Smoking status: Never Smoker  . Smokeless tobacco: Never Used  Vaping Use  . Vaping Use: Never used  Substance and Sexual Activity  . Alcohol use: Not Currently    Comment: wine   . Drug use: No    Types: Marijuana  . Sexual activity: Not Currently    Partners: Male    Birth control/protection: None  Other Topics Concern  . Not on file  Social History Narrative  . Not on file   Social Determinants of Health   Financial Resource Strain:   . Difficulty of Paying Living Expenses: Not on file  Food Insecurity:   . Worried About Programme researcher, broadcasting/film/video in the Last Year: Not on file  . Ran Out of Food in the Last Year: Not on file  Transportation Needs:   . Lack of Transportation (Medical): Not on file  . Lack of Transportation (Non-Medical): Not on file  Physical Activity:   . Days of Exercise per Week: Not on file  . Minutes of Exercise per Session: Not on file  Stress:   . Feeling of Stress : Not on file  Social Connections:   . Frequency of Communication with Friends and Family: Not on file  . Frequency of Social Gatherings with Friends and Family: Not on file  . Attends Religious Services: Not on file  . Active Member of Clubs or Organizations: Not on file  .  Attends Banker Meetings: Not on file  . Marital Status: Not on file  Intimate Partner Violence:   . Fear of Current or Ex-Partner: Not on file  . Emotionally Abused: Not on file  . Physically Abused: Not on file  . Sexually Abused: Not on file   Family History  Problem Relation Age of Onset  . Colon cancer Mother   . Hypertension Father   . Heart disease Maternal Grandmother   . Glaucoma Paternal Grandmother   . Diabetes Maternal Grandfather   . Hypertension Maternal Grandfather   . Hypertension Paternal Aunt   . Diabetes Paternal Aunt   . Diabetes Paternal Uncle   . Hypertension Paternal Uncle     OBJECTIVE:   Vitals:   11/05/19 1822  BP: 127/85    Pulse: 91  Resp: 17  Temp: 99.4 F (37.4 C)  TempSrc: Oral  SpO2: 95%    General appearance: alert; no distress Eyes: Mild conjunctival erythema to RT eye. PERRL; EOMI without discomfort; clear drainage ENT: Ears: EACs clear, TMs pearly gray; Nose: nares patent no rhinorrhea; tonsils nonerythematous, uvula midline  Neck: supple Lungs: Normal respiratory effort Skin: warm and dry Psychological: alert and cooperative; normal mood and affect   ASSESSMENT & PLAN:  1. Conjunctivitis of right eye, unspecified conjunctivitis type     Meds ordered this encounter  Medications  . trimethoprim-polymyxin b (POLYTRIM) ophthalmic solution    Sig: Instill 1 drop of polymyxin B sulfate and trimethoprim sulfate ophthalmic solution (polymyxin B 94174 units/trimethoprim 1 mg per mL) to affected eye(s) every 3 hours for 7 to 10 days    Dispense:  10 mL    Refill:  0    Order Specific Question:   Supervising Provider    Answer:   Eustace Moore [[id number]]  . ketotifen (ZADITOR) 0.025 % ophthalmic solution    Sig: Place 1 drop into the right eye 2 (two) times daily.    Dispense:  5 mL    Refill:  0    Order Specific Question:   Supervising Provider    Answer:   Eustace Moore [[id number]]   Use eye drops as prescribed and to completion Dispose of old contacts and wear glasses until you have finished course of antibiotic eye drops Wash pillow cases, wash hands regularly with soap and water, avoid touching your face and eyes, wash door handles, light switches, remotes and other objects you frequently touch Return or follow up with PCP if symptoms persists such as fever, chills, redness, swelling, eye pain, painful eye movements, vision changes, etc...  Reviewed expectations re: course of current medical issues. Questions answered. Outlined signs and symptoms indicating need for more acute intervention. Patient verbalized understanding. After Visit Summary given.   Kelly Harding,  PA-C 11/05/19 1839

## 2019-11-05 NOTE — Discharge Instructions (Signed)
Use eye drops as prescribed and to completion Dispose of old contacts and wear glasses until you have finished course of antibiotic eye drops Wash pillow cases, wash hands regularly with soap and water, avoid touching your face and eyes, wash door handles, light switches, remotes and other objects you frequently touch Return or follow up with PCP if symptoms persists such as fever, chills, redness, swelling, eye pain, painful eye movements, vision changes, etc... 

## 2019-11-05 NOTE — ED Triage Notes (Signed)
Pain, redness and drainage for RT eye that started this morning when she woke up.

## 2019-11-19 ENCOUNTER — Ambulatory Visit: Payer: Medicaid Other | Admitting: Family Medicine

## 2019-11-25 ENCOUNTER — Ambulatory Visit: Payer: Medicaid Other | Admitting: Orthopaedic Surgery

## 2019-12-23 ENCOUNTER — Ambulatory Visit: Payer: Medicaid Other | Admitting: Orthopaedic Surgery

## 2020-02-10 ENCOUNTER — Other Ambulatory Visit: Payer: Medicaid Other | Admitting: Women's Health

## 2020-03-23 ENCOUNTER — Other Ambulatory Visit: Payer: Self-pay

## 2020-03-23 ENCOUNTER — Other Ambulatory Visit (HOSPITAL_COMMUNITY)
Admission: RE | Admit: 2020-03-23 | Discharge: 2020-03-23 | Disposition: A | Discharge status: Home / Self Care | Payer: Medicaid Other | Source: Ambulatory Visit | Attending: Obstetrics & Gynecology | Admitting: Obstetrics & Gynecology

## 2020-03-23 ENCOUNTER — Ambulatory Visit: Payer: Medicaid Other | Admitting: Women's Health

## 2020-03-23 ENCOUNTER — Encounter: Payer: Self-pay | Admitting: Women's Health

## 2020-03-23 VITALS — BP 116/72 | HR 84 | Ht 66.25 in | Wt 156.0 lb

## 2020-03-23 DIAGNOSIS — Z01419 Encounter for gynecological examination (general) (routine) without abnormal findings: Secondary | ICD-10-CM

## 2020-03-23 DIAGNOSIS — Z800 Family history of malignant neoplasm of digestive organs: Secondary | ICD-10-CM

## 2020-03-23 DIAGNOSIS — Z113 Encounter for screening for infections with a predominantly sexual mode of transmission: Secondary | ICD-10-CM | POA: Diagnosis not present

## 2020-03-23 NOTE — Progress Notes (Signed)
WELL-WOMAN EXAMINATION Patient name: Kelly Villarreal MRN [id number]  Date of birth: [birth year] Chief Complaint:   Gynecologic Exam (Pt wants STD testing.)  History of Present Illness:   XIARA Villarreal is a 30 y.o. G82P1101 African American female being seen today for a routine well-woman exam.  Current complaints: none, wants STD screening  Depression screen [facility] 2/9 03/23/2020 02/10/2019 05/07/2018 05/03/2016 10/27/2015  Decreased Interest 0 0 0 0 1  Down, Depressed, Hopeless 0 0 0 1 0  PHQ - 2 Score 0 0 0 1 1  Altered sleeping 0 - 0 2 -  Tired, decreased energy 1 - 0 1 -  Change in appetite 0 - 0 1 -  Feeling bad or failure about yourself  0 - 0 0 -  Trouble concentrating 0 - 0 2 -  Moving slowly or fidgety/restless 0 - 0 0 -  Suicidal thoughts 0 - 0 0 -  PHQ-9 Score 1 - 0 7 -  Difficult doing work/chores - - - Not difficult at all -     PCP: none, hers in leaving      Patient's last menstrual period was 03/16/2020. The current method of family planning is none, wants to get pregnant, taking pnv Last pap 02/10/19. Results were: NILM w/ HRHPV negative. H/O abnormal pap: no Last mammogram: never. Results were: N/A. Family h/o breast cancer: no Last colonoscopy: never. Results were: N/A. Family h/o colorectal cancer: yes mom dx @ 46yo Review of Systems:   Pertinent items are noted in HPI Denies any headaches, blurred vision, fatigue, shortness of breath, chest pain, abdominal pain, abnormal vaginal discharge/itching/odor/irritation, problems with periods, bowel movements, urination, or intercourse unless otherwise stated above. Pertinent History Reviewed:  Reviewed past medical,surgical, social and family history.  Reviewed problem list, medications and allergies. Physical Assessment:   Vitals:   03/23/20 1057  BP: 116/72  Pulse: 84  Weight: 156 lb (70.8 kg)  Height: 5' 6.25" (1.683 m)  Body mass index is 24.99 kg/m.        Physical Examination: by Albertine Grates, SNP    General appearance - well appearing, and in no distress  Mental status - alert, oriented to person, place, and time  Psych:  She has a normal mood and affect  Skin - warm and dry, normal color, no suspicious lesions noted  Chest - effort normal, all lung fields clear to auscultation bilaterally  Heart - normal rate and regular rhythm  Neck:  midline trachea, no thyromegaly or nodules  Breasts - breasts appear normal, no suspicious masses, no skin or nipple changes or  axillary nodes  Abdomen - soft, nontender, nondistended, no masses or organomegaly  Pelvic - VULVA: normal appearing vulva with no masses, tenderness or lesions  VAGINA: normal appearing vagina with normal color and discharge, no lesions  CERVIX: normal appearing cervix without discharge or lesions, no CMT  Thin prep pap is not done  UTERUS: uterus is felt to be normal size, shape, consistency and nontender   ADNEXA: No adnexal masses or tenderness noted.  Extremities:  No swelling or varicosities noted  Chaperone: me    No results found for this or any previous visit (from the past 24 hour(s)).  Assessment & Plan:  1) Well-Woman Exam  2) STD screen  3) Wants to get pregnant> continue pnv, let us know when gets +HPT  Labs/procedures today: exam, labs  Mammogram: @ 30yo, or sooner if problems Colonoscopy: @ 30yo d/t family history, or sooner if  problems  Orders Placed This Encounter  Procedures  . RPR  . HIV Antibody (routine testing w rflx)    Meds: No orders of the defined types were placed in this encounter.   Follow-up: Return in about 1 year (around 03/23/2021) for Physical.  Cheral Marker CNM, WHNP-BC 03/23/2020 11:30 AM

## 2020-03-23 NOTE — Patient Instructions (Signed)
Primary Care Providers  Dr. Dwana Melena [place]) [phone]  [facility] [phone]  Primary Well[facility]) Dr. Karilyn Cota [phone]  [facility] [place]) [phone]  [facility] [place]) [phone]  [facility] [phone]  [facility] [phone]

## 2020-03-24 LAB — CERVICOVAGINAL ANCILLARY ONLY
Chlamydia: NEGATIVE
Comment: NEGATIVE
Comment: NEGATIVE
Comment: NORMAL
Neisseria Gonorrhea: NEGATIVE
Trichomonas: NEGATIVE

## 2020-03-24 LAB — HIV ANTIBODY (ROUTINE TESTING W REFLEX): HIV Screen 4th Generation wRfx: NONREACTIVE

## 2020-03-24 LAB — RPR: RPR Ser Ql: NONREACTIVE

## 2020-05-12 ENCOUNTER — Ambulatory Visit: Payer: Medicaid Other | Admitting: Nurse Practitioner

## 2020-05-12 ENCOUNTER — Other Ambulatory Visit: Payer: Self-pay

## 2020-05-12 ENCOUNTER — Encounter: Payer: Self-pay | Admitting: Nurse Practitioner

## 2020-05-12 DIAGNOSIS — Z7689 Persons encountering health services in other specified circumstances: Secondary | ICD-10-CM | POA: Diagnosis not present

## 2020-05-12 DIAGNOSIS — R1030 Lower abdominal pain, unspecified: Secondary | ICD-10-CM | POA: Diagnosis not present

## 2020-05-12 DIAGNOSIS — Z139 Encounter for screening, unspecified: Secondary | ICD-10-CM | POA: Diagnosis not present

## 2020-05-12 DIAGNOSIS — R109 Unspecified abdominal pain: Secondary | ICD-10-CM | POA: Insufficient documentation

## 2020-05-12 NOTE — Assessment & Plan Note (Signed)
-  records available in Epic from Dr. Jeanice Lim

## 2020-05-12 NOTE — Assessment & Plan Note (Signed)
-  will check for HCV with next set of labs

## 2020-05-12 NOTE — Patient Instructions (Signed)
Please have fasting labs drawn 2-3 days prior to your appointment so we can discuss the results during your office visit.  

## 2020-05-12 NOTE — Assessment & Plan Note (Signed)
-  unsure of etiology, but she states it comes and goes -will get U/S abd and pelvis -will check labs prior to physical

## 2020-05-12 NOTE — Progress Notes (Signed)
New Patient Office Visit  Subjective:  Patient ID: Kelly Villarreal, female    DOB: [birth year]  Age: 30 y.o. MRN: [id number]  CC:  Chief Complaint  Patient presents with  . New Patient (Initial Visit)    HPI [facility] presents for new patient visit. Transferring care form Dr. Buelah Manis. Last physical was over a year ago, but she had well woman exam with OBGYN on 03/23/20. No recent PCP labs.  She has mild abdominal pain that she rates at 4-5/10 that comes and goes. She has hx of c-section, and her pain started after c-section. Today, she states pain started yesterday, but had occurred previously 4-5 months ago.  Takes PRN tylenol. Denies dysuria, change in bowel habits, N/V/D.  Past Medical History:  Diagnosis Date  . Allergy    seasonal  . Anxiety 03/08/2011  . Depression 03/08/2011  . Grief reaction 05/31/2015  . History of classical cesarean section 03/20/2018   2016: PPROM/abruption @ 24.5wks     2020: LTCS @ 37wks  . History of preterm delivery 03/20/2018   24.5wks cramping>PPROM>low-lying placenta>abruption, neonatal demise       . Nausea and vomiting during pregnancy 08/10/2014  . Threatened miscarriage in early pregnancy 08/10/2014  . Vaginal bleeding in pregnancy 08/10/2014    Past Surgical History:  Procedure Laterality Date  . CESAREAN SECTION N/A 12/16/2014   Procedure: CESAREAN SECTION;  Surgeon: Woodroe Mode, MD;  Location: [facility];  Service: Obstetrics;  Laterality: N/A;  . CESAREAN SECTION N/A 10/20/2018   Procedure: REPEAT CESAREAN SECTION;  Surgeon: Aletha Halim, MD;  Location: [facility];  Service: Obstetrics;  Laterality: N/A;  . MOUTH SURGERY      Family History  Problem Relation Age of Onset  . Colon cancer Mother   . Hypertension Father   . Heart disease Maternal Grandmother   . Glaucoma Paternal Grandmother   . Diabetes Maternal Grandfather   . Hypertension Maternal Grandfather   . Hypertension Paternal Aunt   . Diabetes Paternal Aunt   .  Diabetes Paternal Uncle   . Hypertension Paternal Uncle     Social History   Socioeconomic History  . Marital status: Married    Spouse name: Jeneen Rinks  . Number of children: 1  . Years of education: Not on file  . Highest education level: Not on file  Occupational History  . 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 Smoking status: Never Smoker  . Smokeless tobacco: Never Used  Vaping Use  . Vaping Use: Never used  Substance and Sexual Activity  . Alcohol use: Yes    Comment: wine - occassionally- about 2 glasses, 3-4 x per week  . Drug use: No  . Sexual activity: Yes    Partners: Male    Birth control/protection: None  Other Topics Concern  . Not on file  Social History Narrative  . Not on file   Social Determinants of Health   Financial Resource Strain: Low Risk   . Difficulty of Paying Living Expenses: Not very hard  Food Insecurity: No Food Insecurity  . Worried About Charity fundraiser in the Last Year: Never true  . Ran Out of Food in the Last Year: Never true  Transportation Needs: No Transportation Needs  . Lack of Transportation (Medical): No  . Lack of Transportation (Non-Medical): No  Physical Activity: Insufficiently Active  . Days of Exercise per Week: 2 days  . Minutes  of Exercise per Session: 30 min  Stress: No Stress Concern Present  . Feeling of Stress : Not at all  Social Connections: Moderately Isolated  . Frequency of Communication with Friends and Family: Twice a week  . Frequency of Social Gatherings with Friends and Family: Once a week  . Attends Religious Services: Never  . Active Member of Clubs or Organizations: No  . Attends Archivist Meetings: Never  . Marital Status: Married  Human resources officer Violence: Not At Risk  . Fear of Current or Ex-Partner: No  . Emotionally Abused: No  . Physically Abused: No  . Sexually Abused: No    ROS Review of Systems  Constitutional: Negative.    Respiratory: Negative.   Cardiovascular: Negative.   Genitourinary: Positive for pelvic pain. Negative for decreased urine volume, difficulty urinating, dysuria, flank pain, frequency and hematuria.  Psychiatric/Behavioral: Negative.     Objective:   Today's Vitals: BP 119/80   Pulse 92   Temp 98.2 F (36.8 C)   Resp 20   Ht 5' 6.25" (1.683 m)   Wt 157 lb (71.2 kg)   SpO2 98%   BMI 25.15 kg/m   Physical Exam Constitutional:      Appearance: Normal appearance.  Cardiovascular:     Rate and Rhythm: Normal rate and regular rhythm.     Pulses: Normal pulses.     Heart sounds: Normal heart sounds.  Pulmonary:     Effort: Pulmonary effort is normal.     Breath sounds: Normal breath sounds.  Abdominal:     General: Abdomen is flat. There is no distension.     Palpations: Abdomen is soft. There is no mass.     Tenderness: There is abdominal tenderness. There is no guarding or rebound.     Hernia: No hernia is present.     Comments: Midline tenderness below her umbilicus  Musculoskeletal:        General: Normal range of motion.  Neurological:     Mental Status: She is alert.  Psychiatric:        Mood and Affect: Mood normal.        Behavior: Behavior normal.        Thought Content: Thought content normal.        Judgment: Judgment normal.     Assessment & Plan:   Problem List Items Addressed This Visit      Other   Encounter to establish care    -records available in Epic from Dr. Buelah Manis      Relevant Orders   CBC with Differential/Platelet   CMP14+EGFR   HCV Ab w/Rflx to Verification   Lipid Panel With LDL/HDL Ratio   Screening due    -will check for HCV with next set of labs      Relevant Orders   HCV Ab w/Rflx to Verification   Abdominal pain    -unsure of etiology, but she states it comes and goes -will get U/S abd and pelvis -will check labs prior to physical      Relevant Orders   US Pelvic Complete With Transvaginal   US Abdomen Complete       Outpatient Encounter Medications as of 05/12/2020  Medication Sig  . Acetaminophen (TYLENOL PO) Take by mouth.  . diclofenac Sodium (VOLTAREN) 1 % GEL Apply to knees tid prn  . Prenatal MV & Min w/FA-DHA (PRENATAL ADULT GUMMY/DHA/FA PO) Take 1 tablet by mouth daily.   No facility-administered encounter medications on file as  of 05/12/2020.    Follow-up: Return in about 2 weeks (around 05/26/2020) for Physical Exam (No-PAP).   Noreene Larsson, NP

## 2020-05-24 ENCOUNTER — Encounter (HOSPITAL_COMMUNITY): Payer: Self-pay

## 2020-05-24 ENCOUNTER — Ambulatory Visit (HOSPITAL_COMMUNITY): Payer: Medicaid Other

## 2020-05-26 ENCOUNTER — Other Ambulatory Visit (HOSPITAL_COMMUNITY): Payer: Medicaid Other

## 2020-05-26 ENCOUNTER — Ambulatory Visit (HOSPITAL_COMMUNITY): Payer: Medicaid Other

## 2020-05-31 ENCOUNTER — Encounter: Payer: Medicaid Other | Admitting: Nurse Practitioner

## 2020-06-01 ENCOUNTER — Ambulatory Visit (HOSPITAL_COMMUNITY): Payer: Medicaid Other

## 2020-06-09 ENCOUNTER — Ambulatory Visit (HOSPITAL_COMMUNITY)
Admission: RE | Admit: 2020-06-09 | Discharge: 2020-06-09 | Disposition: A | Discharge status: Home / Self Care | Payer: Medicaid Other | Source: Ambulatory Visit | Attending: Nurse Practitioner | Admitting: Nurse Practitioner

## 2020-06-09 DIAGNOSIS — K8020 Calculus of gallbladder without cholecystitis without obstruction: Secondary | ICD-10-CM | POA: Diagnosis not present

## 2020-06-09 DIAGNOSIS — R1030 Lower abdominal pain, unspecified: Secondary | ICD-10-CM | POA: Diagnosis not present

## 2020-06-09 DIAGNOSIS — N3289 Other specified disorders of bladder: Secondary | ICD-10-CM | POA: Diagnosis not present

## 2020-06-09 NOTE — Progress Notes (Signed)
Ultrasound results came back. You have gallstones present, but they are not causing a blockage of your bile duct, so they would not be responsible for pain. There was nothing obvious with any other organs that would indicate the cause of your pain. Are you still having symptoms?

## 2020-06-10 ENCOUNTER — Encounter: Payer: Medicaid Other | Admitting: Nurse Practitioner

## 2020-06-22 ENCOUNTER — Encounter: Payer: Medicaid Other | Admitting: Nurse Practitioner

## 2020-07-05 ENCOUNTER — Encounter: Payer: Medicaid Other | Admitting: Nurse Practitioner

## 2020-09-15 ENCOUNTER — Telehealth: Payer: Self-pay

## 2020-09-15 NOTE — Telephone Encounter (Signed)
error 

## 2020-10-09 ENCOUNTER — Emergency Department (HOSPITAL_COMMUNITY)
Admission: EM | Admit: 2020-10-09 | Discharge: 2020-10-09 | Disposition: A | Discharge status: Home / Self Care | Payer: Medicaid Other | Attending: Emergency Medicine | Admitting: Emergency Medicine

## 2020-10-09 ENCOUNTER — Encounter (HOSPITAL_COMMUNITY): Payer: Self-pay | Admitting: *Deleted

## 2020-10-09 ENCOUNTER — Other Ambulatory Visit: Payer: Self-pay

## 2020-10-09 DIAGNOSIS — B349 Viral infection, unspecified: Secondary | ICD-10-CM | POA: Diagnosis not present

## 2020-10-09 DIAGNOSIS — R059 Cough, unspecified: Secondary | ICD-10-CM | POA: Diagnosis present

## 2020-10-09 DIAGNOSIS — Z20822 Contact with and (suspected) exposure to covid-19: Secondary | ICD-10-CM | POA: Insufficient documentation

## 2020-10-09 LAB — RESP PANEL BY RT-PCR (FLU A&B, COVID) ARPGX2
Influenza A by PCR: NEGATIVE
Influenza B by PCR: NEGATIVE
SARS Coronavirus 2 by RT PCR: NEGATIVE

## 2020-10-09 MED ORDER — SODIUM CHLORIDE 0.9 % IV BOLUS
1000.0000 mL | Freq: Once | INTRAVENOUS | Status: AC
  Administered 2020-10-09: 1000 mL via INTRAVENOUS

## 2020-10-09 MED ORDER — ONDANSETRON HCL 4 MG/2ML IJ SOLN
4.0000 mg | Freq: Once | INTRAMUSCULAR | Status: AC
  Administered 2020-10-09: 4 mg via INTRAVENOUS
  Filled 2020-10-09: qty 2

## 2020-10-09 MED ORDER — ACETAMINOPHEN 325 MG PO TABS
650.0000 mg | ORAL_TABLET | Freq: Once | ORAL | Status: AC
  Administered 2020-10-09: 650 mg via ORAL
  Filled 2020-10-09: qty 2

## 2020-10-09 NOTE — Discharge Instructions (Addendum)
Drink plenty of fluids.  Take Tylenol for any fevers and aches and follow-up with your doctor next week if problem

## 2020-10-09 NOTE — ED Triage Notes (Signed)
Fever, cough, body aches for 2 days

## 2020-10-09 NOTE — ED Provider Notes (Signed)
[facility] EMERGENCY DEPARTMENT Provider Note   CSN: [id number] Arrival date & time: 10/09/20  1742     History Chief Complaint  Patient presents with   Fever   Generalized Body Aches    Kelly Villarreal is a 30 y.o. female.  Patient complains of cough and mild aches.  Her child has RSV.  The history is provided by the patient and medical records.  Cough Cough characteristics:  Productive Sputum characteristics:  Nondescript Severity:  Moderate Onset quality:  Sudden Timing:  Constant Progression:  Worsening Chronicity:  New Smoker: no   Context: not animal exposure   Relieved by:  Nothing Worsened by:  Nothing Associated symptoms: no chest pain, no eye discharge, no headaches and no rash       Past Medical History:  Diagnosis Date   Allergy    seasonal   Anxiety 03/08/2011   Depression 03/08/2011   Grief reaction 05/31/2015   History of classical cesarean section 03/20/2018   2016: PPROM/abruption @ 24.5wks     2020: LTCS @ 37wks   History of preterm delivery 03/20/2018   24.5wks cramping>PPROM>low-lying placenta>abruption, neonatal demise        Nausea and vomiting during pregnancy 08/10/2014   Threatened miscarriage in early pregnancy 08/10/2014   Vaginal bleeding in pregnancy 08/10/2014    Patient Active Problem List   Diagnosis Date Noted   Screening due 05/12/2020   Abdominal pain 05/12/2020   Family history of colon cancer in mother 03/23/2020   Encounter to establish care 03/20/2018   Seasonal allergies 04/07/2015    Past Surgical History:  Procedure Laterality Date   CESAREAN SECTION N/A 12/16/2014   Procedure: CESAREAN SECTION;  Surgeon: Adam Phenix, MD;  Location: [facility];  Service: Obstetrics;  Laterality: N/A;   CESAREAN SECTION N/A 10/20/2018   Procedure: REPEAT CESAREAN SECTION;  Surgeon: [place] Bing, MD;  Location: [facility];  Service: Obstetrics;  Laterality: N/A;   MOUTH SURGERY       OB History     Gravida  2   Para  2   Term   1   Preterm  1   AB      Living  1      SAB      IAB      Ectopic      Multiple  0   Live Births  2           Family History  Problem Relation Age of Onset   Colon cancer Mother    Hypertension Father    Heart disease Maternal Grandmother    Glaucoma Paternal Grandmother    Diabetes Maternal Grandfather    Hypertension Maternal Grandfather    Hypertension Paternal Aunt    Diabetes Paternal Aunt    Diabetes Paternal Uncle    Hypertension Paternal Uncle     Social History   Tobacco Use   Smoking status: Never   Smokeless tobacco: Never  Vaping Use   Vaping Use: Never used  Substance Use Topics   Alcohol use: Yes    Comment: wine - occassionally- about 2 glasses, 3-4 x per week   Drug use: No    Home Medications Prior to Admission medications   Medication Sig Start Date End Date Taking? Authorizing Provider  Acetaminophen (TYLENOL PO) Take by mouth.    [provider]  diclofenac Sodium (VOLTAREN) 1 % GEL Apply to knees tid prn 01/21/19   Salley Scarlet, MD  Prenatal MV &  Min w/FA-DHA (PRENATAL ADULT GUMMY/DHA/FA PO) Take 1 tablet by mouth daily.    [provider]    Allergies    Patient has no known allergies.  Review of Systems   Review of Systems  Constitutional:  Negative for appetite change and fatigue.  HENT:  Negative for congestion, ear discharge and sinus pressure.   Eyes:  Negative for discharge.  Respiratory:  Positive for cough.   Cardiovascular:  Negative for chest pain.  Gastrointestinal:  Negative for abdominal pain and diarrhea.  Genitourinary:  Negative for frequency and hematuria.  Musculoskeletal:  Negative for back pain.  Skin:  Negative for rash.  Neurological:  Negative for seizures and headaches.  Psychiatric/Behavioral:  Negative for hallucinations.    Physical Exam Updated Vital Signs BP 125/90   Pulse 91   Temp 99 F (37.2 C) (Oral)   Resp 18   Ht 5' 6.5" (1.689 m)   Wt 64 kg   LMP  10/03/2020   SpO2 100%   BMI 22.43 kg/m   Physical Exam Vitals and nursing note reviewed.  Constitutional:      Appearance: She is well-developed.  HENT:     Head: Normocephalic.     Nose: Nose normal.  Eyes:     General: No scleral icterus.    Conjunctiva/sclera: Conjunctivae normal.  Neck:     Thyroid: No thyromegaly.  Cardiovascular:     Rate and Rhythm: Normal rate and regular rhythm.     Heart sounds: No murmur heard.   No friction rub. No gallop.  Pulmonary:     Breath sounds: No stridor. No wheezing or rales.  Chest:     Chest wall: No tenderness.  Abdominal:     General: There is no distension.     Tenderness: There is no abdominal tenderness. There is no rebound.  Musculoskeletal:        General: Normal range of motion.     Cervical back: Neck supple.  Lymphadenopathy:     Cervical: No cervical adenopathy.  Skin:    Findings: No erythema or rash.  Neurological:     Mental Status: She is alert and oriented to person, place, and time.     Motor: No abnormal muscle tone.     Coordination: Coordination normal.  Psychiatric:        Behavior: Behavior normal.    ED Results / Procedures / Treatments   Labs (all labs ordered are listed, but only abnormal results are displayed) Labs Reviewed  RESP PANEL BY RT-PCR (FLU A&B, COVID) ARPGX2    EKG None  Radiology No results found.  Procedures Procedures   Medications Ordered in ED Medications  sodium chloride 0.9 % bolus 1,000 mL (0 mLs Intravenous Stopped 10/09/20 2054)  ondansetron (ZOFRAN) injection 4 mg (4 mg Intravenous Given 10/09/20 1843)  acetaminophen (TYLENOL) tablet 650 mg (650 mg Oral Given 10/09/20 2133)    ED Course  I have reviewed the triage vital signs and the nursing notes.  Pertinent labs & imaging results that were available during my care of the patient were reviewed by me and considered in my medical decision making (see chart for details).    MDM Rules/Calculators/A&P                            Patient with viral syndrome Final Clinical Impression(s) / ED Diagnoses Final diagnoses:  Viral syndrome    Rx / DC Orders ED Discharge Orders  None        Bethann Berkshire, MD 10/11/20 1101

## 2020-10-11 ENCOUNTER — Telehealth: Payer: Self-pay

## 2020-10-11 NOTE — Telephone Encounter (Signed)
Transition Care Management Follow-up Telephone Call Date of discharge and from where: 10/09/2020-[place] How have you been since you were released from the hospital? Patient stated she is doing fine.  Any questions or concerns? No  Items Reviewed: Did the pt receive and understand the discharge instructions provided? Yes  Medications obtained and verified? Yes  Other? No  Any new allergies since your discharge? No  Dietary orders reviewed? No Do you have support at home? Yes   Home Care and Equipment/Supplies: Were home health services ordered? not applicable If so, what is the name of the agency? N/A  Has the agency set up a time to come to the patient's home? not applicable Were any new equipment or medical supplies ordered?  No What is the name of the medical supply agency? N/A Were you able to get the supplies/equipment? not applicable Do you have any questions related to the use of the equipment or supplies? No  Functional Questionnaire: (I = Independent and D = Dependent) ADLs: I  Bathing/Dressing- I  Meal Prep- I  Eating- I  Maintaining continence- I  Transferring/Ambulation- I  Managing Meds- I  Follow up appointments reviewed:  PCP Hospital f/u appt confirmed? No  Specialist Hospital f/u appt confirmed? No  Are transportation arrangements needed? No  If their condition worsens, is the pt aware to call PCP or go to the Emergency Dept.? Yes Was the patient provided with contact information for the PCP's office or ED? Yes Was to pt encouraged to call back with questions or concerns? Yes

## 2020-12-16 ENCOUNTER — Encounter: Payer: Self-pay | Admitting: *Deleted

## 2020-12-16 ENCOUNTER — Ambulatory Visit (INDEPENDENT_AMBULATORY_CARE_PROVIDER_SITE_OTHER): Payer: Medicaid Other | Admitting: *Deleted

## 2020-12-16 ENCOUNTER — Other Ambulatory Visit: Payer: Self-pay

## 2020-12-16 VITALS — Ht 66.0 in | Wt 152.5 lb

## 2020-12-16 DIAGNOSIS — Z3201 Encounter for pregnancy test, result positive: Secondary | ICD-10-CM | POA: Diagnosis not present

## 2020-12-16 MED ORDER — PROMETHAZINE HCL 25 MG PO TABS
25.0000 mg | ORAL_TABLET | Freq: Four times a day (QID) | ORAL | 1 refills | Status: DC | PRN
Start: 2020-12-16 — End: 2021-07-06

## 2020-12-16 NOTE — Progress Notes (Signed)
Chart reviewed for nurse visit. Agree with plan of care. Rx sent in for phenergan  Adline Potter, NP 12/16/2020 4:47 PM

## 2020-12-16 NOTE — Addendum Note (Signed)
Addended by: Cyril Mourning A on: 12/16/2020 04:47 PM   Modules accepted: Orders

## 2020-12-16 NOTE — Progress Notes (Signed)
   NURSE VISIT- PREGNANCY CONFIRMATION   SUBJECTIVE:  Kelly Villarreal is a 30 y.o. G51P1101 female at [redacted]w[redacted]d by certain LMP of Patient's last menstrual period was 10/28/2020. Here for pregnancy confirmation.  Home pregnancy test: positive x 2.   She reports  spotting and cramping ; also nausea.  She is not taking prenatal vitamins.    OBJECTIVE:  Ht 5\' 6"  (1.676 m)   Wt 152 lb 8 oz (69.2 kg)   LMP 10/28/2020   Breastfeeding Yes   BMI 24.61 kg/m   Appears well, in no apparent distress  No results found for this or any previous visit (from the past 24 hour(s)).  ASSESSMENT: Positive pregnancy test, [redacted]w[redacted]d by LMP    PLAN: Schedule for dating ultrasound  will wait for quant results.  Prenatal vitamins: plans to begin OTC ASAP   Nausea medicines: requested-note routed to [facility] to send prescription   OB packet given: Yes  [redacted]w[redacted]d  12/16/2020 4:36 PM

## 2020-12-17 LAB — BETA HCG QUANT (REF LAB): hCG Quant: 63591 m[IU]/mL

## 2020-12-17 LAB — PROGESTERONE: Progesterone: 12.4 ng/mL

## 2021-01-13 ENCOUNTER — Other Ambulatory Visit: Payer: Self-pay | Admitting: Obstetrics & Gynecology

## 2021-01-13 DIAGNOSIS — O3680X0 Pregnancy with inconclusive fetal viability, not applicable or unspecified: Secondary | ICD-10-CM

## 2021-01-14 ENCOUNTER — Other Ambulatory Visit: Payer: Self-pay

## 2021-01-14 ENCOUNTER — Ambulatory Visit (INDEPENDENT_AMBULATORY_CARE_PROVIDER_SITE_OTHER): Payer: Medicaid Other

## 2021-01-14 DIAGNOSIS — O3680X0 Pregnancy with inconclusive fetal viability, not applicable or unspecified: Secondary | ICD-10-CM | POA: Diagnosis not present

## 2021-01-14 DIAGNOSIS — Z3A11 11 weeks gestation of pregnancy: Secondary | ICD-10-CM | POA: Diagnosis not present

## 2021-01-17 ENCOUNTER — Encounter: Payer: Self-pay | Admitting: Women's Health

## 2021-01-31 DIAGNOSIS — Z8759 Personal history of other complications of pregnancy, childbirth and the puerperium: Secondary | ICD-10-CM | POA: Insufficient documentation

## 2021-01-31 DIAGNOSIS — Z98891 History of uterine scar from previous surgery: Secondary | ICD-10-CM | POA: Insufficient documentation

## 2021-01-31 DIAGNOSIS — Z3490 Encounter for supervision of normal pregnancy, unspecified, unspecified trimester: Secondary | ICD-10-CM | POA: Insufficient documentation

## 2021-02-02 ENCOUNTER — Ambulatory Visit: Payer: Medicaid Other | Admitting: *Deleted

## 2021-02-02 ENCOUNTER — Ambulatory Visit (INDEPENDENT_AMBULATORY_CARE_PROVIDER_SITE_OTHER): Payer: Medicaid Other | Admitting: Women's Health

## 2021-02-02 ENCOUNTER — Encounter: Payer: Self-pay | Admitting: Women's Health

## 2021-02-02 ENCOUNTER — Other Ambulatory Visit: Payer: Self-pay

## 2021-02-02 ENCOUNTER — Other Ambulatory Visit (HOSPITAL_COMMUNITY)
Admission: RE | Admit: 2021-02-02 | Discharge: 2021-02-02 | Disposition: A | Discharge status: Home / Self Care | Payer: Medicaid Other | Source: Ambulatory Visit | Attending: Women's Health | Admitting: Women's Health

## 2021-02-02 VITALS — BP 113/69 | HR 87 | Wt 142.0 lb

## 2021-02-02 DIAGNOSIS — O26891 Other specified pregnancy related conditions, first trimester: Secondary | ICD-10-CM

## 2021-02-02 DIAGNOSIS — Z3481 Encounter for supervision of other normal pregnancy, first trimester: Secondary | ICD-10-CM | POA: Diagnosis not present

## 2021-02-02 DIAGNOSIS — Z6791 Unspecified blood type, Rh negative: Secondary | ICD-10-CM | POA: Diagnosis not present

## 2021-02-02 DIAGNOSIS — Z8759 Personal history of other complications of pregnancy, childbirth and the puerperium: Secondary | ICD-10-CM | POA: Diagnosis not present

## 2021-02-02 DIAGNOSIS — O26899 Other specified pregnancy related conditions, unspecified trimester: Secondary | ICD-10-CM | POA: Diagnosis not present

## 2021-02-02 DIAGNOSIS — Z98891 History of uterine scar from previous surgery: Secondary | ICD-10-CM

## 2021-02-02 DIAGNOSIS — Z3480 Encounter for supervision of other normal pregnancy, unspecified trimester: Secondary | ICD-10-CM | POA: Diagnosis not present

## 2021-02-02 DIAGNOSIS — N898 Other specified noninflammatory disorders of vagina: Secondary | ICD-10-CM | POA: Insufficient documentation

## 2021-02-02 DIAGNOSIS — Z3A13 13 weeks gestation of pregnancy: Secondary | ICD-10-CM | POA: Diagnosis not present

## 2021-02-02 LAB — POCT URINALYSIS DIPSTICK OB
Blood, UA: NEGATIVE
Glucose, UA: NEGATIVE
Ketones, UA: NEGATIVE
Leukocytes, UA: NEGATIVE
Nitrite, UA: NEGATIVE
POC,PROTEIN,UA: NEGATIVE

## 2021-02-02 NOTE — Patient Instructions (Signed)
Kelly Villarreal, thank you for choosing our office today! We appreciate the opportunity to meet your healthcare needs. You may receive a short survey by mail, e-mail, or through [employer]. If you are happy with your care we would appreciate if you could take just a few minutes to complete the survey questions. We read all of your comments and take your feedback very seriously. Thank you again for choosing our office.  [facility] [employer] [facility] at [facility]  [facility] ([address] [place], [place] [address]) Entrance C, located off of [address] Free 24/7 valet parking   Nausea & Vomiting Have saltine crackers or pretzels by your bed and eat a few bites before you raise your head out of bed in the morning Eat small frequent meals throughout the day instead of large meals Drink plenty of fluids throughout the day to stay hydrated, just don't drink a lot of fluids with your meals.  This can make your stomach fill up faster making you feel sick Do not brush your teeth right after you eat Products with real ginger are good for nausea, like ginger ale and ginger hard candy Make sure it says made with real ginger! Sucking on sour candy like lemon heads is also good for nausea If your prenatal vitamins make you nauseated, take them at night so you will sleep through the nausea Sea Bands If you feel like you need medicine for the nausea & vomiting please let us know If you are unable to keep any fluids or food down please let us know   Constipation Drink plenty of fluid, preferably water, throughout the day Eat foods high in fiber such as fruits, vegetables, and grains Exercise, such as walking, is a good way to keep your bowels regular Drink warm fluids, especially warm prune juice, or decaf coffee Eat a 1/2 cup of real oatmeal (not instant), 1/2 cup applesauce, and 1/2-1 cup warm prune juice every day If needed, you may take Colace (docusate sodium) stool softener  once or twice a day to help keep the stool soft.  If you still are having problems with constipation, you may take Miralax once daily as needed to help keep your bowels regular.   Home Blood Pressure Monitoring for Patients   Your provider has recommended that you check your blood pressure (BP) at least once a week at home. If you do not have a blood pressure cuff at home, one will be provided for you. Contact your provider if you have not received your monitor within 1 week.   Helpful Tips for Accurate Home Blood Pressure Checks  Don't smoke, exercise, or drink caffeine 30 minutes before checking your BP Use the restroom before checking your BP (a full bladder can raise your pressure) Relax in a comfortable upright chair Feet on the ground Left arm resting comfortably on a flat surface at the level of your heart Legs uncrossed Back supported Sit quietly and don't talk Place the cuff on your bare arm Adjust snuggly, so that only two fingertips can fit between your skin and the top of the cuff Check 2 readings separated by at least one minute Keep a log of your BP readings For a visual, please reference this diagram: http://ccnc.care/bpdiagram  Provider Name: [facility]     Phone: [phone]  Zone 1: ALL CLEAR  Continue to monitor your symptoms:  BP reading is less than 140 (top number) or less than 90 (bottom  number)  No right upper stomach pain No headaches or seeing spots No feeling nauseated or throwing up No swelling in face and hands  Zone 2: CAUTION Call your doctor's office for any of the following:  BP reading is greater than 140 (top number) or greater than 90 (bottom number)  Stomach pain under your ribs in the middle or right side Headaches or seeing spots Feeling nauseated or throwing up Swelling in face and hands  Zone 3: EMERGENCY  Seek immediate medical care if you have any of the following:  BP reading is greater than160 (top number) or greater than  110 (bottom number) Severe headaches not improving with Tylenol Serious difficulty catching your breath Any worsening symptoms from Zone 2    First Trimester of Pregnancy The first trimester of pregnancy is from week 1 until the end of week 12 (months 1 through 3). A week after a sperm fertilizes an egg, the egg will implant on the wall of the uterus. This embryo will begin to develop into a baby. Genes from you and your partner are forming the baby. The female genes determine whether the baby is a boy or a girl. At 6-8 weeks, the eyes and face are formed, and the heartbeat can be seen on ultrasound. At the end of 12 weeks, all the baby's organs are formed.  Now that you are pregnant, you will want to do everything you can to have a healthy baby. Two of the most important things are to get good prenatal care and to follow your health care provider's instructions. Prenatal care is all the medical care you receive before the baby's birth. This care will help prevent, find, and treat any problems during the pregnancy and childbirth. BODY CHANGES Your body goes through many changes during pregnancy. The changes vary from woman to woman.  You may gain or lose a couple of pounds at first. You may feel sick to your stomach (nauseous) and throw up (vomit). If the vomiting is uncontrollable, call your health care provider. You may tire easily. You may develop headaches that can be relieved by medicines approved by your health care provider. You may urinate more often. Painful urination may mean you have a bladder infection. You may develop heartburn as a result of your pregnancy. You may develop constipation because certain hormones are causing the muscles that push waste through your intestines to slow down. You may develop hemorrhoids or swollen, bulging veins (varicose veins). Your breasts may begin to grow larger and become tender. Your nipples may stick out more, and the tissue that surrounds them  (areola) may become darker. Your gums may bleed and may be sensitive to brushing and flossing. Dark spots or blotches (chloasma, mask of pregnancy) may develop on your face. This will likely fade after the baby is born. Your menstrual periods will stop. You may have a loss of appetite. You may develop cravings for certain kinds of food. You may have changes in your emotions from day to day, such as being excited to be pregnant or being concerned that something may go wrong with the pregnancy and baby. You may have more vivid and strange dreams. You may have changes in your hair. These can include thickening of your hair, rapid growth, and changes in texture. Some women also have hair loss during or after pregnancy, or hair that feels dry or thin. Your hair will most likely return to normal after your baby is born. WHAT TO EXPECT AT YOUR PRENATAL   VISITS During a routine prenatal visit: You will be weighed to make sure you and the baby are growing normally. Your blood pressure will be taken. Your abdomen will be measured to track your baby's growth. The fetal heartbeat will be listened to starting around week 10 or 12 of your pregnancy. Test results from any previous visits will be discussed. Your health care provider may ask you: How you are feeling. If you are feeling the baby move. If you have had any abnormal symptoms, such as leaking fluid, bleeding, severe headaches, or abdominal cramping. If you have any questions. Other tests that may be performed during your first trimester include: Blood tests to find your blood type and to check for the presence of any previous infections. They will also be used to check for low iron levels (anemia) and Rh antibodies. Later in the pregnancy, blood tests for diabetes will be done along with other tests if problems develop. Urine tests to check for infections, diabetes, or protein in the urine. An ultrasound to confirm the proper growth and development  of the baby. An amniocentesis to check for possible genetic problems. Fetal screens for spina bifida and Down syndrome. You may need other tests to make sure you and the baby are doing well. HOME CARE INSTRUCTIONS  Medicines Follow your health care provider's instructions regarding medicine use. Specific medicines may be either safe or unsafe to take during pregnancy. Take your prenatal vitamins as directed. If you develop constipation, try taking a stool softener if your health care provider approves. Diet Eat regular, well-balanced meals. Choose a variety of foods, such as meat or vegetable-based protein, fish, milk and low-fat dairy products, vegetables, fruits, and whole grain breads and cereals. Your health care provider will help you determine the amount of weight gain that is right for you. Avoid raw meat and uncooked cheese. These carry germs that can cause birth defects in the baby. Eating four or five small meals rather than three large meals a day may help relieve nausea and vomiting. If you start to feel nauseous, eating a few soda crackers can be helpful. Drinking liquids between meals instead of during meals also seems to help nausea and vomiting. If you develop constipation, eat more high-fiber foods, such as fresh vegetables or fruit and whole grains. Drink enough fluids to keep your urine clear or pale yellow. Activity and Exercise Exercise only as directed by your health care provider. Exercising will help you: Control your weight. Stay in shape. Be prepared for labor and delivery. Experiencing pain or cramping in the lower abdomen or low back is a good sign that you should stop exercising. Check with your health care provider before continuing normal exercises. Try to avoid standing for long periods of time. Move your legs often if you must stand in one place for a long time. Avoid heavy lifting. Wear low-heeled shoes, and practice good posture. You may continue to have sex  unless your health care provider directs you otherwise. Relief of Pain or Discomfort Wear a good support bra for breast tenderness.   Take warm sitz baths to soothe any pain or discomfort caused by hemorrhoids. Use hemorrhoid cream if your health care provider approves.   Rest with your legs elevated if you have leg cramps or low back pain. If you develop varicose veins in your legs, wear support hose. Elevate your feet for 15 minutes, 3-4 times a day. Limit salt in your diet. Prenatal Care Schedule your prenatal visits by the   twelfth week of pregnancy. They are usually scheduled monthly at first, then more often in the last 2 months before delivery. Write down your questions. Take them to your prenatal visits. Keep all your prenatal visits as directed by your health care provider. Safety Wear your seat belt at all times when driving. Make a list of emergency phone numbers, including numbers for family, friends, the hospital, and police and fire departments. General Tips Ask your health care provider for a referral to a local prenatal education class. Begin classes no later than at the beginning of month 6 of your pregnancy. Ask for help if you have counseling or nutritional needs during pregnancy. Your health care provider can offer advice or refer you to specialists for help with various needs. Do not use hot tubs, steam rooms, or saunas. Do not douche or use tampons or scented sanitary pads. Do not cross your legs for long periods of time. Avoid cat litter boxes and soil used by cats. These carry germs that can cause birth defects in the baby and possibly loss of the fetus by miscarriage or stillbirth. Avoid all smoking, herbs, alcohol, and medicines not prescribed by your health care provider. Chemicals in these affect the formation and growth of the baby. Schedule a dentist appointment. At home, brush your teeth with a soft toothbrush and be gentle when you floss. SEEK MEDICAL CARE IF:   You have dizziness. You have mild pelvic cramps, pelvic pressure, or nagging pain in the abdominal area. You have persistent nausea, vomiting, or diarrhea. You have a bad smelling vaginal discharge. You have pain with urination. You notice increased swelling in your face, hands, legs, or ankles. SEEK IMMEDIATE MEDICAL CARE IF:  You have a fever. You are leaking fluid from your vagina. You have spotting or bleeding from your vagina. You have severe abdominal cramping or pain. You have rapid weight gain or loss. You vomit blood or material that looks like coffee grounds. You are exposed to German measles and have never had them. You are exposed to fifth disease or chickenpox. You develop a severe headache. You have shortness of breath. You have any kind of trauma, such as from a fall or a car accident. Document Released: 12/27/2000 Document Revised: 05/19/2013 Document Reviewed: 11/12/2012 ExitCare Patient Information 2015 [employer]. This information is not intended to replace advice given to you by your health care provider. Make sure you discuss any questions you have with your health care provider.  

## 2021-02-02 NOTE — Progress Notes (Signed)
INITIAL OBSTETRICAL VISIT Patient name: Kelly Villarreal MRN [id number]  Date of birth: 1990/11/24 Chief Complaint:   Initial Prenatal Visit  History of Present Illness:   Kelly Villarreal is a 31 y.o. G81P1101 African-American female at [redacted]w[redacted]d by LMP c/w u/s at 11 weeks with an Estimated Date of Delivery: 08/04/21 being seen today for her initial obstetrical visit.   Patient's last menstrual period was 10/28/2020. Her obstetrical history is significant for 24.5wk PTB w/ neonatal demise- went to ED that am w/ bad cramping, water broke in ED, began bleeding, had stat C/S upon arrival to [facility] d/t FHR 100s, classical incision. Had low-lying placenta.  Today she reports  vaginal d/c .  Last pap 02/10/19. Results were: NILM w/ HRHPV negative  Depression screen [facility] 2/9 02/02/2021 05/12/2020 03/23/2020 02/10/2019 05/07/2018  Decreased Interest 0 0 0 0 0  Down, Depressed, Hopeless 0 0 0 0 0  PHQ - 2 Score 0 0 0 0 0  Altered sleeping 0 - 0 - 0  Tired, decreased energy 2 - 1 - 0  Change in appetite 0 - 0 - 0  Feeling bad or failure about yourself  0 - 0 - 0  Trouble concentrating 0 - 0 - 0  Moving slowly or fidgety/restless 0 - 0 - 0  Suicidal thoughts 0 - 0 - 0  PHQ-9 Score 2 - 1 - 0  Difficult doing work/chores - - - - -     GAD 7 : Generalized Anxiety Score 02/02/2021 03/23/2020  Nervous, Anxious, on Edge 0 0  Control/stop worrying 0 0  Worry too much - different things 0 1  Trouble relaxing 0 0  Restless 0 1  Easily annoyed or irritable 1 1  Afraid - awful might happen 1 0  Total GAD 7 Score 2 3     Review of Systems:   Pertinent items are noted in HPI Denies cramping/contractions, leakage of fluid, vaginal bleeding, abnormal vaginal discharge w/ itching/odor/irritation, headaches, visual changes, shortness of breath, chest pain, abdominal pain, severe nausea/vomiting, or problems with urination or bowel movements unless otherwise stated above.  Pertinent History Reviewed:  Reviewed past  medical,surgical, social, obstetrical and family history.  Reviewed problem list, medications and allergies. OB History  Gravida Para Term Preterm AB Living  3 2 1 1   1   SAB IAB Ectopic Multiple Live Births        0 2    # Outcome Date GA Lbr Len/2nd Weight Sex Delivery Anes PTL Lv  3 Current           2 Term 10/20/18 [redacted]w[redacted]d  6 lb 4.5 oz (2.85 kg) M CS-LTranv Spinal N LIV  1 Preterm 12/16/14 [redacted]w[redacted]d  1 lb 14.3 oz (0.86 kg) M CS-LTranv Gen  ND     Complications: History of preterm premature rupture of membranes (PPROM), Placental abruption   Physical Assessment:   Vitals:   02/02/21 1402  BP: 113/69  Pulse: 87  Weight: 142 lb (64.4 kg)  Body mass index is 22.92 kg/m.       Physical Examination:  General appearance - well appearing, and in no distress  Mental status - alert, oriented to person, place, and time  Psych:  She has a normal mood and affect  Skin - warm and dry, normal color, no suspicious lesions noted  Chest - effort normal, all lung fields clear to auscultation bilaterally  Heart - normal rate and regular rhythm  Abdomen - soft, nontender  Extremities:  No swelling or varicosities noted  Pelvic - VULVA: normal appearing vulva with no masses, tenderness or lesions  VAGINA: normal appearing vagina with normal color and discharge, no lesions  CERVIX: normal appearing cervix without discharge or lesions, no CMT  Thin prep pap is not done   Chaperone: Malachy Mood    TODAY'S FHT: 152 u/s  Results for orders placed or performed in visit on 02/02/21 (from the past 24 hour(s))  POC Urinalysis Dipstick OB   Collection Time: 02/02/21  4:26 PM  Result Value Ref Range   Color, UA     Clarity, UA     Glucose, UA Negative Negative   Bilirubin, UA     Ketones, UA neg    Spec Grav, UA     Blood, UA neg    pH, UA     POC,PROTEIN,UA Negative Negative, Trace, Small (1+), Moderate (2+), Large (3+), 4+   Urobilinogen, UA     Nitrite, UA neg    Leukocytes, UA Negative  Negative   Appearance     Odor      Assessment & Plan:  1) Low-Risk Pregnancy G3P1101 at [redacted]w[redacted]d with an Estimated Date of Delivery: 08/04/21   2) Initial OB visit  3) H/O 24.5wk PTB> d/t cramping/PPROM, low-lying placenta, FHR 100s. Did Makena last pregnancy, wants to do again, ordered today, start @ 16wks  4) Prev c/s x 2> w/ h/o classical incision, for RCS @ 37wks  5) Dep/anx> no meds, doing well  Meds: No orders of the defined types were placed in this encounter.   Initial labs obtained Continue prenatal vitamins Reviewed n/v relief measures and warning s/s to report Reviewed recommended weight gain based on pre-gravid BMI Encouraged well-balanced diet Genetic & carrier screening discussed: requests Panorama and AFP, declines NT/IT and Horizon  Ultrasound discussed; fetal survey: requested CCNC completed> form faxed if has or is planning to apply for medicaid The nature of [place] [facility] [employer] with multiple MDs and other Advanced Practice Providers was explained to patient; also emphasized that fellows, residents, and students are part of our team. Does have home bp cuff. Office bp cuff given: no. Rx sent: n/a. Check bp weekly, let us know if consistently >140/90.   Follow-up: Return in about 15 days (around 02/17/2021) for start weekly makena on 2/2; then next visit 4wks from now for anatomy u/s and lrob w/ cnm.   Orders Placed This Encounter  Procedures   Urine Culture   Genetic Screening   CBC/D/Plt+RPR+Rh+ABO+RubIgG...   POC Urinalysis Dipstick OB    Cheral Marker CNM, [place] [facility] 02/02/2021 4:54 PM

## 2021-02-03 LAB — CBC/D/PLT+RPR+RH+ABO+RUBIGG...
Antibody Screen: NEGATIVE
Basophils Absolute: 0 10*3/uL (ref 0.0–0.2)
Basos: 0 %
EOS (ABSOLUTE): 0.1 10*3/uL (ref 0.0–0.4)
Eos: 2 %
HCV Ab: <0.1 s/co ratio (ref 0.0–0.9)
HIV Screen 4th Generation wRfx: NONREACTIVE
Hematocrit: 38.1 % (ref 34.0–46.6)
Hemoglobin: 13.2 g/dL (ref 11.1–15.9)
Hepatitis B Surface Ag: NEGATIVE
Immature Grans (Abs): 0 10*3/uL (ref 0.0–0.1)
Immature Granulocytes: 0 %
Lymphocytes Absolute: 1.8 10*3/uL (ref 0.7–3.1)
Lymphs: 29 %
MCH: 32 pg (ref 26.6–33.0)
MCHC: 34.6 g/dL (ref 31.5–35.7)
MCV: 93 fL (ref 79–97)
Monocytes Absolute: 0.6 10*3/uL (ref 0.1–0.9)
Monocytes: 9 %
Neutrophils Absolute: 3.7 10*3/uL (ref 1.4–7.0)
Neutrophils: 60 %
Platelets: 212 10*3/uL (ref 150–450)
RBC: 4.12 x10E6/uL (ref 3.77–5.28)
RDW: 13.1 % (ref 11.7–15.4)
RPR Ser Ql: NONREACTIVE
Rh Factor: NEGATIVE
Rubella Antibodies, IGG: 1.5 index (ref >0.99)
WBC: 6.2 10*3/uL (ref 3.4–10.8)

## 2021-02-03 LAB — HCV INTERPRETATION

## 2021-02-04 LAB — CERVICOVAGINAL ANCILLARY ONLY
Bacterial Vaginitis (gardnerella): NEGATIVE
Candida Glabrata: NEGATIVE
Candida Vaginitis: NEGATIVE
Chlamydia: NEGATIVE
Comment: NEGATIVE
Comment: NEGATIVE
Comment: NEGATIVE
Comment: NEGATIVE
Comment: NEGATIVE
Comment: NORMAL
Neisseria Gonorrhea: NEGATIVE
Trichomonas: NEGATIVE

## 2021-02-04 LAB — URINE CULTURE

## 2021-02-17 ENCOUNTER — Other Ambulatory Visit: Payer: Self-pay

## 2021-02-17 ENCOUNTER — Ambulatory Visit (INDEPENDENT_AMBULATORY_CARE_PROVIDER_SITE_OTHER): Payer: Medicaid Other | Admitting: *Deleted

## 2021-02-17 VITALS — BP 100/63 | HR 107 | Wt 147.0 lb

## 2021-02-17 DIAGNOSIS — Z6791 Unspecified blood type, Rh negative: Secondary | ICD-10-CM

## 2021-02-17 DIAGNOSIS — O26899 Other specified pregnancy related conditions, unspecified trimester: Secondary | ICD-10-CM

## 2021-02-17 DIAGNOSIS — Z3A16 16 weeks gestation of pregnancy: Secondary | ICD-10-CM | POA: Diagnosis not present

## 2021-02-17 DIAGNOSIS — Z8759 Personal history of other complications of pregnancy, childbirth and the puerperium: Secondary | ICD-10-CM | POA: Diagnosis not present

## 2021-02-17 LAB — POCT URINALYSIS DIPSTICK OB
Blood, UA: NEGATIVE
Glucose, UA: NEGATIVE
Ketones, UA: NEGATIVE
Leukocytes, UA: NEGATIVE
Nitrite, UA: NEGATIVE
POC,PROTEIN,UA: NEGATIVE

## 2021-02-17 MED ORDER — HYDROXYPROGESTERONE CAPROATE 275 MG/1.1ML ~~LOC~~ SOAJ
275.0000 mg | SUBCUTANEOUS | Status: DC
Start: 2021-02-17 — End: 2021-02-23
  Administered 2021-02-17: 275 mg via SUBCUTANEOUS

## 2021-02-17 NOTE — Progress Notes (Signed)
° °  NURSE VISIT- INJECTION  SUBJECTIVE:  Kelly Villarreal is a 31 y.o. G24P1101 female here for a Makena for history of preterm birth. She is [redacted]w[redacted]d pregnant.   OBJECTIVE:  BP 100/63    Pulse (!) 107    Wt 147 lb (66.7 kg)    LMP 10/28/2020    BMI 23.73 kg/m   Appears well, in no apparent distress  Injection administered in: Left arm  Meds ordered this encounter  Medications   HYDROXYprogesterone caproate (Makena) autoinjector 275 mg    ASSESSMENT: Pregnancy [redacted]w[redacted]d Makena for history of preterm birth PLAN: Follow-up: in 1 week for next Zachery Dauer  02/17/2021 11:26 AM

## 2021-02-22 ENCOUNTER — Encounter: Payer: Self-pay | Admitting: Women's Health

## 2021-02-23 ENCOUNTER — Encounter: Payer: Self-pay | Admitting: *Deleted

## 2021-02-23 ENCOUNTER — Ambulatory Visit (INDEPENDENT_AMBULATORY_CARE_PROVIDER_SITE_OTHER): Payer: Medicaid Other | Admitting: *Deleted

## 2021-02-23 ENCOUNTER — Other Ambulatory Visit: Payer: Self-pay

## 2021-02-23 VITALS — BP 98/62 | HR 100 | Wt 144.5 lb

## 2021-02-23 DIAGNOSIS — Z3A16 16 weeks gestation of pregnancy: Secondary | ICD-10-CM | POA: Diagnosis not present

## 2021-02-23 DIAGNOSIS — O09892 Supervision of other high risk pregnancies, second trimester: Secondary | ICD-10-CM | POA: Diagnosis not present

## 2021-02-23 DIAGNOSIS — Z3480 Encounter for supervision of other normal pregnancy, unspecified trimester: Secondary | ICD-10-CM

## 2021-02-23 DIAGNOSIS — Z6791 Unspecified blood type, Rh negative: Secondary | ICD-10-CM

## 2021-02-23 MED ORDER — HYDROXYPROGESTERONE CAPROATE 275 MG/1.1ML ~~LOC~~ SOAJ
275.0000 mg | Freq: Once | SUBCUTANEOUS | Status: AC
  Administered 2021-02-23: 275 mg via SUBCUTANEOUS

## 2021-02-23 NOTE — Progress Notes (Signed)
° °  NURSE VISIT- INJECTION  SUBJECTIVE:  Kelly Villarreal is a 31 y.o. G78P1101 female here for a Makena for history of preterm birth. She is [redacted]w[redacted]d pregnant.   OBJECTIVE:  Wt 144 lb 8 oz (65.5 kg)    LMP 10/28/2020    BMI 23.32 kg/m   Appears well, in no apparent distress  Injection administered in: Right arm  Meds ordered this encounter  Medications   HYDROXYprogesterone caproate (Makena) autoinjector 275 mg    ASSESSMENT: Pregnancy [redacted]w[redacted]d Makena for history of preterm birth PLAN: Follow-up: in 1 week for next Alric Ran  02/23/2021 10:35 AM

## 2021-02-24 ENCOUNTER — Ambulatory Visit: Payer: Medicaid Other

## 2021-03-02 ENCOUNTER — Other Ambulatory Visit: Payer: Self-pay

## 2021-03-02 ENCOUNTER — Ambulatory Visit (INDEPENDENT_AMBULATORY_CARE_PROVIDER_SITE_OTHER): Payer: Medicaid Other

## 2021-03-02 VITALS — BP 112/66 | HR 84 | Wt 146.0 lb

## 2021-03-02 DIAGNOSIS — Z3480 Encounter for supervision of other normal pregnancy, unspecified trimester: Secondary | ICD-10-CM

## 2021-03-02 DIAGNOSIS — Z3A17 17 weeks gestation of pregnancy: Secondary | ICD-10-CM | POA: Diagnosis not present

## 2021-03-02 DIAGNOSIS — O26899 Other specified pregnancy related conditions, unspecified trimester: Secondary | ICD-10-CM

## 2021-03-02 MED ORDER — HYDROXYPROGESTERONE CAPROATE 275 MG/1.1ML ~~LOC~~ SOAJ
275.0000 mg | Freq: Once | SUBCUTANEOUS | Status: AC
  Administered 2021-03-02: 275 mg via SUBCUTANEOUS

## 2021-03-02 NOTE — Progress Notes (Signed)
° °  NURSE VISIT- INJECTION  SUBJECTIVE:  Kelly Villarreal is a 31 y.o. G57P1101 female here for a Makena for history of preterm birth. She is [redacted]w[redacted]d pregnant.   OBJECTIVE:  BP 112/66    Pulse 84    Wt 146 lb (66.2 kg)    LMP 10/28/2020    BMI 23.57 kg/m   Appears well, in no apparent distress  Injection administered in: Left arm  Meds ordered this encounter  Medications   HYDROXYprogesterone caproate (Makena) autoinjector 275 mg    ASSESSMENT: Pregnancy [redacted]w[redacted]d Makena for history of preterm birth PLAN: Follow-up: in 1 week for next Makena   Giovonnie Trettel A Shyheem Whitham  03/02/2021 11:02 AM

## 2021-03-08 ENCOUNTER — Other Ambulatory Visit: Payer: Self-pay | Admitting: Women's Health

## 2021-03-08 DIAGNOSIS — Z363 Encounter for antenatal screening for malformations: Secondary | ICD-10-CM

## 2021-03-09 ENCOUNTER — Encounter: Payer: Self-pay | Admitting: Obstetrics & Gynecology

## 2021-03-09 ENCOUNTER — Ambulatory Visit (INDEPENDENT_AMBULATORY_CARE_PROVIDER_SITE_OTHER): Payer: Medicaid Other | Admitting: Obstetrics & Gynecology

## 2021-03-09 ENCOUNTER — Other Ambulatory Visit: Payer: Self-pay

## 2021-03-09 ENCOUNTER — Ambulatory Visit (INDEPENDENT_AMBULATORY_CARE_PROVIDER_SITE_OTHER): Payer: Medicaid Other

## 2021-03-09 VITALS — BP 115/67 | HR 78 | Wt 149.6 lb

## 2021-03-09 DIAGNOSIS — Z23 Encounter for immunization: Secondary | ICD-10-CM

## 2021-03-09 DIAGNOSIS — Z3480 Encounter for supervision of other normal pregnancy, unspecified trimester: Secondary | ICD-10-CM

## 2021-03-09 DIAGNOSIS — O0992 Supervision of high risk pregnancy, unspecified, second trimester: Secondary | ICD-10-CM | POA: Diagnosis not present

## 2021-03-09 DIAGNOSIS — O26899 Other specified pregnancy related conditions, unspecified trimester: Secondary | ICD-10-CM

## 2021-03-09 DIAGNOSIS — Z363 Encounter for antenatal screening for malformations: Secondary | ICD-10-CM

## 2021-03-09 DIAGNOSIS — Z3A18 18 weeks gestation of pregnancy: Secondary | ICD-10-CM | POA: Diagnosis not present

## 2021-03-09 MED ORDER — HYDROXYPROGESTERONE CAPROATE 275 MG/1.1ML ~~LOC~~ SOAJ
275.0000 mg | SUBCUTANEOUS | Status: DC
  Administered 2021-03-09 – 2021-05-11 (×9): 275 mg via SUBCUTANEOUS

## 2021-03-09 NOTE — Progress Notes (Signed)
[place] 18+6 wks,cephalic,posterior placenta gr 0,normal ovaries,cx 3.5 cm,FHR 157 bpm,SVP of fluid 3.6 cm,EFW 312 g 91%,anatomy complete,no obvious abnormalities

## 2021-03-09 NOTE — Progress Notes (Signed)
HIGH-RISK PREGNANCY VISIT Patient name: Kelly Villarreal MRN [id number]  Date of birth: 1990/06/14 Chief Complaint:   Routine Prenatal Visit  History of Present Illness:   Kelly Villarreal is a 31 y.o. G69P1101 female at [redacted]w[redacted]d with an Estimated Date of Delivery: 08/04/21 being seen today for ongoing management of a high-risk pregnancy complicated by:  -prior classical C-section -prior PTD- on Makena weekly.    Today she reports  concerns regarding sinus pressure .   Contractions: Not present. Vag. Bleeding: None.  Movement: Present. denies leaking of fluid.   Depression screen [facility] 2/9 02/02/2021 05/12/2020 03/23/2020 02/10/2019 05/07/2018  Decreased Interest 0 0 0 0 0  Down, Depressed, Hopeless 0 0 0 0 0  PHQ - 2 Score 0 0 0 0 0  Altered sleeping 0 - 0 - 0  Tired, decreased energy 2 - 1 - 0  Change in appetite 0 - 0 - 0  Feeling bad or failure about yourself  0 - 0 - 0  Trouble concentrating 0 - 0 - 0  Moving slowly or fidgety/restless 0 - 0 - 0  Suicidal thoughts 0 - 0 - 0  PHQ-9 Score 2 - 1 - 0  Difficult doing work/chores - - - - -     Current Outpatient Medications  Medication Instructions   Acetaminophen (TYLENOL PO) Oral   MAKENA autoinjector Subcutaneous   Prenatal MV & Min w/FA-DHA (PRENATAL GUMMIES PO) Oral   promethazine (PHENERGAN) 25 mg, Oral, Every 6 hours PRN     Review of Systems:   Pertinent items are noted in HPI Denies abnormal vaginal discharge w/ itching/odor/irritation, headaches, visual changes, shortness of breath, chest pain, abdominal pain, severe nausea/vomiting, or problems with urination or bowel movements unless otherwise stated above. Pertinent History Reviewed:  Reviewed past medical,surgical, social, obstetrical and family history.  Reviewed problem list, medications and allergies. Physical Assessment:   Vitals:   03/09/21 1114  BP: 115/67  Pulse: 78  Weight: 149 lb 9.6 oz (67.9 kg)  Body mass index is 24.15 kg/m.           Physical  Examination:   General appearance: alert, well appearing, and in no distress  Mental status: normal mood, behavior, speech, dress, motor activity, and thought processes  Skin: warm & dry   Extremities: Edema: None    Cardiovascular: normal heart rate noted  Respiratory: normal respiratory effort, no distress  Abdomen: gravid, soft, non-tender  Pelvic: Cervical exam deferred         Fetal Status:     Movement: Present    cephalic,posterior placenta gr 0,normal ovaries,cx 3.5 cm,FHR 157 bpm,SVP of fluid 3.6 cm,EFW 312 g 91%,anatomy complete,no obvious abnormalities   Fetal Surveillance Testing today: anatomy scan   Chaperone: N/A    No results found for this or any previous visit (from the past 24 hour(s)).   Assessment & Plan:  High-risk pregnancy: G3P1101 at [redacted]w[redacted]d with an Estimated Date of Delivery: 08/04/21   -continue Makena weekly -O neg, plan for RhoGAM  -reviewed contraceptive options- does not want anymore children.  Desires permanent sterilization- reviewed risk/benefit and alternatives wishes to proceed  Meds:  Meds ordered this encounter  Medications   HYDROXYprogesterone caproate (Makena) autoinjector 275 mg    Labs/procedures today: anatomy scan  Treatment Plan:  continue as outlined above  Reviewed: Preterm labor symptoms and general obstetric precautions including but not limited to vaginal bleeding, contractions, leaking of fluid and fetal movement were reviewed in detail with the  patient.  All questions were answered.   Follow-up: Return in about 4 weeks (around 04/06/2021) for [place] visit, []  BTL consent.   Future Appointments  Date Time Provider [place]  03/16/2021 10:30 AM [facility] NURSE [facility]    Orders Placed This Encounter  Procedures   Flu Vaccine QUAD 36+ mos IM (Fluarix, Quad PF)    Janyth Pupa, DO Attending [place] for [employer], [place]

## 2021-03-16 ENCOUNTER — Ambulatory Visit (INDEPENDENT_AMBULATORY_CARE_PROVIDER_SITE_OTHER): Payer: Medicaid Other | Admitting: *Deleted

## 2021-03-16 ENCOUNTER — Other Ambulatory Visit: Payer: Self-pay

## 2021-03-16 VITALS — BP 108/69 | HR 89 | Wt 151.0 lb

## 2021-03-16 DIAGNOSIS — O0992 Supervision of high risk pregnancy, unspecified, second trimester: Secondary | ICD-10-CM

## 2021-03-16 DIAGNOSIS — Z3A19 19 weeks gestation of pregnancy: Secondary | ICD-10-CM

## 2021-03-16 DIAGNOSIS — Z8759 Personal history of other complications of pregnancy, childbirth and the puerperium: Secondary | ICD-10-CM

## 2021-03-16 LAB — POCT URINALYSIS DIPSTICK OB
Blood, UA: NEGATIVE
Glucose, UA: NEGATIVE
Ketones, UA: NEGATIVE
Leukocytes, UA: NEGATIVE
Nitrite, UA: NEGATIVE
POC,PROTEIN,UA: NEGATIVE

## 2021-03-16 NOTE — Progress Notes (Signed)
? ?  NURSE VISIT- INJECTION ? ?SUBJECTIVE:  ?Kelly Villarreal is a 31 y.o. G24P1101 female here for a Makena for history of preterm birth. She is [redacted]w[redacted]d pregnant.  ? ?OBJECTIVE:  ?BP 108/69   Pulse 89   Wt 151 lb (68.5 kg)   LMP 10/28/2020   BMI 24.37 kg/m?   ?Appears well, in no apparent distress ? ?Injection administered in: Left arm ? ?No orders of the defined types were placed in this encounter. ? ? ?ASSESSMENT: ?Pregnancy [redacted]w[redacted]d Makena for history of preterm birth ?PLAN: ?Follow-up: in 1 week for next Makena  ? ?Annamarie Dawley  ?03/16/2021 ?10:37 AM  ?

## 2021-03-23 ENCOUNTER — Ambulatory Visit (INDEPENDENT_AMBULATORY_CARE_PROVIDER_SITE_OTHER): Payer: Medicaid Other

## 2021-03-23 ENCOUNTER — Other Ambulatory Visit: Payer: Self-pay

## 2021-03-23 VITALS — BP 119/70 | HR 82 | Wt 155.0 lb

## 2021-03-23 DIAGNOSIS — Z8759 Personal history of other complications of pregnancy, childbirth and the puerperium: Secondary | ICD-10-CM | POA: Diagnosis not present

## 2021-03-23 DIAGNOSIS — O09892 Supervision of other high risk pregnancies, second trimester: Secondary | ICD-10-CM

## 2021-03-23 DIAGNOSIS — O0992 Supervision of high risk pregnancy, unspecified, second trimester: Secondary | ICD-10-CM | POA: Diagnosis not present

## 2021-03-23 LAB — POCT URINALYSIS DIPSTICK OB
Blood, UA: NEGATIVE
Glucose, UA: NEGATIVE
Ketones, UA: NEGATIVE
Leukocytes, UA: NEGATIVE
Nitrite, UA: NEGATIVE
POC,PROTEIN,UA: NEGATIVE

## 2021-03-23 NOTE — Progress Notes (Signed)
? ?  NURSE VISIT- INJECTION ? ?SUBJECTIVE:  ?Kelly Villarreal is a 31 y.o. G23P1101 female here for a Makena for history of preterm birth. She is [redacted]w[redacted]d pregnant.  ? ?OBJECTIVE:  ?BP 119/70   Pulse 82   Wt 155 lb (70.3 kg)   LMP 10/28/2020   BMI 25.02 kg/m?   ?Appears well, in no apparent distress ? ?Injection administered in: Right arm ? ?No orders of the defined types were placed in this encounter. ? ? ?ASSESSMENT: ?Pregnancy [redacted]w[redacted]d Makena for history of preterm birth ?PLAN: ?Follow-up: in 1 week for next Makena  ? ?Oyinkansola Truax A Anahita Cua  ?03/23/2021 ?11:15 AM ? ?

## 2021-03-30 ENCOUNTER — Ambulatory Visit (INDEPENDENT_AMBULATORY_CARE_PROVIDER_SITE_OTHER): Payer: Medicaid Other | Admitting: *Deleted

## 2021-03-30 ENCOUNTER — Other Ambulatory Visit: Payer: Self-pay

## 2021-03-30 DIAGNOSIS — Z8759 Personal history of other complications of pregnancy, childbirth and the puerperium: Secondary | ICD-10-CM

## 2021-03-30 DIAGNOSIS — Z3480 Encounter for supervision of other normal pregnancy, unspecified trimester: Secondary | ICD-10-CM | POA: Diagnosis not present

## 2021-03-30 NOTE — Progress Notes (Signed)
? ?  NURSE VISIT- INJECTION ? ?SUBJECTIVE:  ?Kelly Villarreal is a 31 y.o. G60P1101 female here for a Makena for history of preterm birth. She is [redacted]w[redacted]d pregnant.  ? ?OBJECTIVE:  ?LMP 10/28/2020   ?Appears well, in no apparent distress ? ?Injection administered in: Left arm ? ?No orders of the defined types were placed in this encounter. ? ? ?ASSESSMENT: ?Pregnancy 110w6d Makena for history of preterm birth ?PLAN: ?Follow-up: in 1 week for next Makena  ? ?Kelly Villarreal  ?03/30/2021 ?10:38 AM ? ?

## 2021-04-06 ENCOUNTER — Encounter: Payer: Self-pay | Admitting: Advanced Practice Midwife

## 2021-04-06 ENCOUNTER — Ambulatory Visit (INDEPENDENT_AMBULATORY_CARE_PROVIDER_SITE_OTHER): Payer: Medicaid Other | Admitting: Advanced Practice Midwife

## 2021-04-06 ENCOUNTER — Other Ambulatory Visit: Payer: Self-pay

## 2021-04-06 VITALS — BP 101/66 | HR 95 | Wt 159.0 lb

## 2021-04-06 DIAGNOSIS — Z3480 Encounter for supervision of other normal pregnancy, unspecified trimester: Secondary | ICD-10-CM | POA: Diagnosis not present

## 2021-04-06 DIAGNOSIS — Z3A22 22 weeks gestation of pregnancy: Secondary | ICD-10-CM

## 2021-04-06 DIAGNOSIS — Z8759 Personal history of other complications of pregnancy, childbirth and the puerperium: Secondary | ICD-10-CM | POA: Diagnosis not present

## 2021-04-06 MED ORDER — HYDROXYPROGESTERONE CAPROATE 275 MG/1.1ML ~~LOC~~ SOAJ
275.0000 mg | Freq: Once | SUBCUTANEOUS | Status: AC
  Administered 2021-04-06: 275 mg via SUBCUTANEOUS

## 2021-04-06 NOTE — Patient Instructions (Signed)
Kelly Villarreal, I greatly value your feedback.  If you receive a survey following your visit with [place] today, we appreciate you taking the time to fill it out.  ?Thanks, ?Philipp Deputy, CNM ?  ?You will have your sugar test next visit.  Please do not eat or drink anything after midnight the night before you come, not even water.  You will be here for at least two hours.  Please make an appointment online for the bloodwork at [email or web address] for 8:30am (or as close to this as possible). Make sure you select the [facility] service center. The day of the appointment, check in with our office first, then you will go to [facility] to start the sugar test.   ? ?[facility] HAS MOVED!!! ?It is now [facility] at [facility] ?([address] [place], [place] [address]) ?Entrance C, located off of [address] ?Free 24/7 valet parking  ?Go to [email or web address] to register for FREE online childbirth classes  ? ?Call the office [phone]) or go to [facility] if: ?You begin to have strong, frequent contractions ?Your water breaks.  Sometimes it is a big gush of fluid, sometimes it is just a trickle that keeps getting your panties wet or running down your legs ?You have vaginal bleeding.  It is normal to have a small amount of spotting if your cervix was checked.  ?You don't feel your baby moving like normal.  If you don't, get you something to eat and drink and lay down and focus on feeling your baby move.   If your baby is still not moving like normal, you should call the office or go to [facility]. ? ?[place] Pediatricians/Family Doctors: [facility] [phone]           ?[facility] [phone]                ? Family Medicine [phone] (usually not accepting new patients unless you have family there already, you are always welcome to call and ask)      ?[facility] Department [phone]      ? ?[place] Pediatricians/Family Doctors:  ?[facility]: [phone] ?[facility] [facility]: [phone] ?[facility] [place]: [phone] ? ?[place] Family Doctors:  ?[facility]: [phone]  ?[facility]: [phone] ? ?[place] Family Doctors: ?[facility]: [phone]  ? ?Home Blood Pressure Monitoring for Patients  ? ?Your provider has recommended that you check your blood pressure (BP) at least once a week at home. If you do not have a blood pressure cuff at home, one will be provided for you. Contact your provider if you have not received your monitor within 1 week.  ? ?Helpful Tips for Accurate Home Blood Pressure Checks  ?Don't smoke, exercise, or drink caffeine 30 minutes before checking your BP ?Use the restroom before checking your BP (a full bladder can raise your pressure) ?Relax in a comfortable upright chair ?Feet on the ground ?Left arm resting comfortably on a flat surface at the level of your heart ?Legs uncrossed ?Back supported ?Sit quietly and don't talk ?Place the cuff on your bare arm ?Adjust snuggly, so that only two fingertips can fit between your skin and the top of the cuff ?Check 2 readings separated by at least one minute ?Keep a log of your BP readings ?For a visual, please reference this diagram: http://ccnc.care/bpdiagram ? ?Provider Name: [facility]     Phone: [phone] ? ?Zone 1: ALL CLEAR  ?  Continue to monitor your symptoms:  ?BP reading is less than 140 (top number) or less than 90 (bottom number)  ?No right upper stomach pain ?No headaches or seeing spots ?No feeling nauseated or throwing up ?No swelling in face and hands ? ?Zone 2: CAUTION ?Call your doctor's office for any of the following:  ?BP reading is greater than 140 (top number) or greater than 90 (bottom number)  ?Stomach pain under your ribs in the middle or right side ?Headaches or seeing spots ?Feeling nauseated or throwing up ?Swelling in face and hands ? ?Zone 3: EMERGENCY  ?Seek  immediate medical care if you have any of the following:  ?BP reading is greater than160 (top number) or greater than 110 (bottom number) ?Severe headaches not improving with Tylenol ?Serious difficulty catching your breath ?Any worsening symptoms from Zone 2  ? ?Second Trimester of Pregnancy ?The second trimester is from week 13 through week 28, months 4 through 6. The second trimester is often a time when you feel your best. Your body has also adjusted to being pregnant, and you begin to feel better physically. Usually, morning sickness has lessened or quit completely, you may have more energy, and you may have an increase in appetite. The second trimester is also a time when the fetus is growing rapidly. At the end of the sixth month, the fetus is about 9 inches long and weighs about 1? pounds. You will likely begin to feel the baby move (quickening) between 18 and 20 weeks of the pregnancy. ?BODY CHANGES ?Your body goes through many changes during pregnancy. The changes vary from woman to woman.  ?Your weight will continue to increase. You will notice your lower abdomen bulging out. ?You may begin to get stretch marks on your hips, abdomen, and breasts. ?You may develop headaches that can be relieved by medicines approved by your health care provider. ?You may urinate more often because the fetus is pressing on your bladder. ?You may develop or continue to have heartburn as a result of your pregnancy. ?You may develop constipation because certain hormones are causing the muscles that push waste through your intestines to slow down. ?You may develop hemorrhoids or swollen, bulging veins (varicose veins). ?You may have back pain because of the weight gain and pregnancy hormones relaxing your joints between the bones in your pelvis and as a result of a shift in weight and the muscles that support your balance. ?Your breasts will continue to grow and be tender. ?Your gums may bleed and may be sensitive to brushing  and flossing. ?Dark spots or blotches (chloasma, mask of pregnancy) may develop on your face. This will likely fade after the baby is born. ?A dark line from your belly button to the pubic area (linea nigra) may appear. This will likely fade after the baby is born. ?You may have changes in your hair. These can include thickening of your hair, rapid growth, and changes in texture. Some women also have hair loss during or after pregnancy, or hair that feels dry or thin. Your hair will most likely return to normal after your baby is born. ?WHAT TO EXPECT AT YOUR PRENATAL VISITS ?During a routine prenatal visit: ?You will be weighed to make sure you and the fetus are growing normally. ?Your blood pressure will be taken. ?Your abdomen will be measured to track your baby's growth. ?The fetal heartbeat will be listened to. ?Any test results from the previous visit will be discussed. ?Your health care provider  may ask you: ?How you are feeling. ?If you are feeling the baby move. ?If you have had any abnormal symptoms, such as leaking fluid, bleeding, severe headaches, or abdominal cramping. ?If you have any questions. ?Other tests that may be performed during your second trimester include: ?Blood tests that check for: ?Low iron levels (anemia). ?Gestational diabetes (between 24 and 28 weeks). ?Rh antibodies. ?Urine tests to check for infections, diabetes, or protein in the urine. ?An ultrasound to confirm the proper growth and development of the baby. ?An amniocentesis to check for possible genetic problems. ?Fetal screens for spina bifida and Down syndrome. ?HOME CARE INSTRUCTIONS  ?Avoid all smoking, herbs, alcohol, and unprescribed drugs. These chemicals affect the formation and growth of the baby. ?Follow your health care provider's instructions regarding medicine use. There are medicines that are either safe or unsafe to take during pregnancy. ?Exercise only as directed by your health care provider. Experiencing  uterine cramps is a good sign to stop exercising. ?Continue to eat regular, healthy meals. ?Wear a good support bra for breast tenderness. ?Do not use hot tubs, steam rooms, or saunas. ?Wear your seat belt at a

## 2021-04-06 NOTE — Progress Notes (Signed)
? ?  LOW-RISK PREGNANCY VISIT ?Patient name: Kelly Villarreal MRN [id number]  Date of birth: 1990/03/15 ?Chief Complaint:   ?Routine Prenatal Visit (17P today) ? ?History of Present Illness:   ?Kelly Villarreal is a 31 y.o. G68P1101 female at [redacted]w[redacted]d with an Estimated Date of Delivery: 08/04/21 being seen today for ongoing management of a low-risk pregnancy.  ?Today she reports  some intermittent low back pain . Contractions: Not present. Vag. Bleeding: None.  Movement: Present. denies leaking of fluid. ?Review of Systems:   ?Pertinent items are noted in HPI ?Denies abnormal vaginal discharge w/ itching/odor/irritation, headaches, visual changes, shortness of breath, chest pain, abdominal pain, severe nausea/vomiting, or problems with urination or bowel movements unless otherwise stated above. ?Pertinent History Reviewed:  ?Reviewed past medical,surgical, social, obstetrical and family history.  ?Reviewed problem list, medications and allergies. ?Physical Assessment:  ? ?Vitals:  ? 04/06/21 1059  ?BP: 101/66  ?Pulse: 95  ?Weight: 159 lb (72.1 kg)  ?Body mass index is 25.66 kg/m?. ?  ?     Physical Examination:  ? General appearance: Well appearing, and in no distress ? Mental status: Alert, oriented to person, place, and time ? Skin: Warm & dry ? Cardiovascular: Normal heart rate noted ? Respiratory: Normal respiratory effort, no distress ? Abdomen: Soft, gravid, nontender ? Pelvic: Cervical exam deferred        ? Extremities: Edema: None ? ?Fetal Status: Fetal Heart Rate (bpm): 153   Movement: Present   ? ?No results found for this or any previous visit (from the past 24 hour(s)).  ?Assessment & Plan:  ?1) Low-risk pregnancy G3P1101 at [redacted]w[redacted]d with an Estimated Date of Delivery: 08/04/21  ? ?2) LBP, tips given ? ?3) Prev C/S x 2 (one classical), plan for rLTCS & BTL @ 37wks ? ?4) PTD, getting weekly Makena ? ?5) Rh neg, Rhogam ~28wks ?  ?Meds:  ?Meds ordered this encounter  ?Medications  ? HYDROXYprogesterone caproate (Makena)  autoinjector 275 mg  ? ?Labs/procedures today: Makena ? ?Plan:  Continue routine obstetrical care  ? ?Reviewed: Preterm labor symptoms and general obstetric precautions including but not limited to vaginal bleeding, contractions, leaking of fluid and fetal movement were reviewed in detail with the patient.  All questions were answered. Has home bp cuff. Check bp weekly, let us know if >140/90.  ? ?Follow-up: Return in about 4 weeks (around 05/04/2021) for 17P, LROB, PN2; weekly Makena (if available). ? ?No orders of the defined types were placed in this encounter. ? ?Arabella Merles CNM ?04/06/2021 ?11:24 AM  ?

## 2021-04-13 ENCOUNTER — Other Ambulatory Visit: Payer: Self-pay

## 2021-04-13 ENCOUNTER — Ambulatory Visit (INDEPENDENT_AMBULATORY_CARE_PROVIDER_SITE_OTHER): Payer: Medicaid Other | Admitting: *Deleted

## 2021-04-13 VITALS — BP 101/66 | HR 99 | Wt 160.0 lb

## 2021-04-13 DIAGNOSIS — Z6791 Unspecified blood type, Rh negative: Secondary | ICD-10-CM

## 2021-04-13 DIAGNOSIS — Z3480 Encounter for supervision of other normal pregnancy, unspecified trimester: Secondary | ICD-10-CM | POA: Diagnosis not present

## 2021-04-13 DIAGNOSIS — Z8759 Personal history of other complications of pregnancy, childbirth and the puerperium: Secondary | ICD-10-CM | POA: Diagnosis not present

## 2021-04-13 DIAGNOSIS — Z3A23 23 weeks gestation of pregnancy: Secondary | ICD-10-CM | POA: Diagnosis not present

## 2021-04-13 NOTE — Progress Notes (Signed)
? ?  NURSE VISIT- INJECTION ? ?SUBJECTIVE:  ?Kelly Villarreal is a 31 y.o. G61P1101 female here for a Makena for history of preterm birth. She is [redacted]w[redacted]d pregnant.  ? ?OBJECTIVE:  ?BP 101/66   Pulse 99   Wt 160 lb (72.6 kg)   LMP 10/28/2020   BMI 25.82 kg/m?   ?Appears well, in no apparent distress ? ?Injection administered in: Right deltoid ? ?No orders of the defined types were placed in this encounter. ? ? ?ASSESSMENT: ?Pregnancy [redacted]w[redacted]d Makena for history of preterm birth ?PLAN: ?Follow-up: in 1 week for next Makena  ? ?Kristeen Miss Kelly Villarreal  ?04/13/2021 ?10:53 AM ? ?

## 2021-04-15 ENCOUNTER — Encounter: Payer: Self-pay | Admitting: Women's Health

## 2021-04-20 ENCOUNTER — Ambulatory Visit (INDEPENDENT_AMBULATORY_CARE_PROVIDER_SITE_OTHER): Payer: Medicaid Other | Admitting: *Deleted

## 2021-04-20 VITALS — BP 116/69 | HR 94 | Wt 161.0 lb

## 2021-04-20 DIAGNOSIS — Z3480 Encounter for supervision of other normal pregnancy, unspecified trimester: Secondary | ICD-10-CM

## 2021-04-20 DIAGNOSIS — Z6791 Unspecified blood type, Rh negative: Secondary | ICD-10-CM

## 2021-04-20 DIAGNOSIS — O09219 Supervision of pregnancy with history of pre-term labor, unspecified trimester: Secondary | ICD-10-CM | POA: Diagnosis not present

## 2021-04-20 DIAGNOSIS — Z3A24 24 weeks gestation of pregnancy: Secondary | ICD-10-CM

## 2021-04-20 NOTE — Progress Notes (Signed)
? ?  NURSE VISIT- INJECTION ? ?SUBJECTIVE:  ?Kelly Villarreal is a 31 y.o. G30P1101 female here for a Makena for history of preterm birth. She is [redacted]w[redacted]d pregnant.  ? ?OBJECTIVE:  ?BP 116/69   Pulse 94   Wt 161 lb (73 kg)   LMP 10/28/2020   BMI 25.99 kg/m?   ?Appears well, in no apparent distress ? ?Injection administered in: Left arm ? ?No orders of the defined types were placed in this encounter. ? ? ?ASSESSMENT: ?Pregnancy [redacted]w[redacted]d Makena for history of preterm birth ?PLAN: ?Follow-up: in 1 week for next Makena  ? ?Kelly Villarreal  ?04/20/2021 ?10:53 AM  ?

## 2021-04-27 ENCOUNTER — Encounter: Payer: Self-pay | Admitting: *Deleted

## 2021-04-27 ENCOUNTER — Ambulatory Visit (INDEPENDENT_AMBULATORY_CARE_PROVIDER_SITE_OTHER): Payer: Medicaid Other | Admitting: *Deleted

## 2021-04-27 VITALS — BP 113/70 | HR 95 | Wt 161.0 lb

## 2021-04-27 DIAGNOSIS — Z3480 Encounter for supervision of other normal pregnancy, unspecified trimester: Secondary | ICD-10-CM

## 2021-04-27 DIAGNOSIS — O0990 Supervision of high risk pregnancy, unspecified, unspecified trimester: Secondary | ICD-10-CM

## 2021-04-27 DIAGNOSIS — Z3A25 25 weeks gestation of pregnancy: Secondary | ICD-10-CM | POA: Diagnosis not present

## 2021-04-27 DIAGNOSIS — Z6791 Unspecified blood type, Rh negative: Secondary | ICD-10-CM

## 2021-04-27 NOTE — Progress Notes (Signed)
? ?  NURSE VISIT- INJECTION ? ?SUBJECTIVE:  ?Kelly Villarreal is a 31 y.o. G72P1101 female here for a Makena for history of preterm birth. She is [redacted]w[redacted]d pregnant.  ? ?OBJECTIVE:  ?BP 113/70   Pulse 95   Wt 161 lb (73 kg)   LMP 10/28/2020   BMI 25.99 kg/m?   ?Appears well, in no apparent distress ? ?Injection administered in: Right arm ? ?No orders of the defined types were placed in this encounter. ? ? ?ASSESSMENT: ?Pregnancy [redacted]w[redacted]d Makena for history of preterm birth ?PLAN: ?Follow-up: in 1 week for next Makena  ? ?Malachy Mood  ?04/27/2021 ?11:02 AM ? ?

## 2021-05-04 ENCOUNTER — Encounter: Payer: Self-pay | Admitting: Medical

## 2021-05-04 ENCOUNTER — Other Ambulatory Visit: Payer: Medicaid Other

## 2021-05-04 ENCOUNTER — Ambulatory Visit (INDEPENDENT_AMBULATORY_CARE_PROVIDER_SITE_OTHER): Payer: Medicaid Other | Admitting: Medical

## 2021-05-04 VITALS — BP 109/68 | HR 88 | Wt 164.0 lb

## 2021-05-04 DIAGNOSIS — Z3009 Encounter for other general counseling and advice on contraception: Secondary | ICD-10-CM

## 2021-05-04 DIAGNOSIS — Z8759 Personal history of other complications of pregnancy, childbirth and the puerperium: Secondary | ICD-10-CM | POA: Diagnosis not present

## 2021-05-04 DIAGNOSIS — Z131 Encounter for screening for diabetes mellitus: Secondary | ICD-10-CM

## 2021-05-04 DIAGNOSIS — Z3481 Encounter for supervision of other normal pregnancy, first trimester: Secondary | ICD-10-CM

## 2021-05-04 DIAGNOSIS — Z23 Encounter for immunization: Secondary | ICD-10-CM

## 2021-05-04 DIAGNOSIS — Z3A26 26 weeks gestation of pregnancy: Secondary | ICD-10-CM

## 2021-05-04 DIAGNOSIS — Z3480 Encounter for supervision of other normal pregnancy, unspecified trimester: Secondary | ICD-10-CM

## 2021-05-04 DIAGNOSIS — O26899 Other specified pregnancy related conditions, unspecified trimester: Secondary | ICD-10-CM

## 2021-05-04 DIAGNOSIS — Z98891 History of uterine scar from previous surgery: Secondary | ICD-10-CM

## 2021-05-04 DIAGNOSIS — Z6791 Unspecified blood type, Rh negative: Secondary | ICD-10-CM

## 2021-05-04 NOTE — Progress Notes (Signed)
? ?  PRENATAL VISIT NOTE ? ?Subjective:  ?Kelly Villarreal is a 31 y.o. G3P1101 at [redacted]w[redacted]d being seen today for ongoing prenatal care.  She is currently monitored for the following issues for this high-risk pregnancy and has Seasonal allergies; Family history of colon cancer in mother; Abdominal pain; Encounter for supervision of normal pregnancy, antepartum; History of cesarean delivery; History of preterm premature rupture of membranes (PPROM); and Rh negative state in antepartum period on their problem list. ? ?Patient reports no complaints.  Contractions: Not present. Vag. Bleeding: None.  Movement: Present. Denies leaking of fluid.  ? ?The following portions of the patient's history were reviewed and updated as appropriate: allergies, current medications, past family history, past medical history, past social history, past surgical history and problem list.  ? ?Objective:  ? ?Vitals:  ? 05/04/21 0931  ?BP: 109/68  ?Pulse: 88  ?Weight: 164 lb (74.4 kg)  ? ? ?Fetal Status: Fetal Heart Rate (bpm): 138 Fundal Height: 26 cm Movement: Present    ? ?General:  Alert, oriented and cooperative. Patient is in no acute distress.  ?Skin: Skin is warm and dry. No rash noted.   ?Cardiovascular: Normal heart rate noted  ?Respiratory: Normal respiratory effort, no problems with respiration noted  ?Abdomen: Soft, gravid, appropriate for gestational age.  Pain/Pressure: Absent     ?Pelvic: Cervical exam deferred        ?Extremities: Normal range of motion.  Edema: Trace  ?Mental Status: Normal mood and affect. Normal behavior. Normal judgment and thought content.  ? ?Assessment and Plan:  ?Pregnancy: G3P1101 at [redacted]w[redacted]d ?1. Supervision of other normal pregnancy, antepartum ?- 2 hour GTT, CBC, HIV, RPR today  ?- TDAP given today  ? ?2. History of cesarean delivery ?- planning repeat, will send information to schedule today  ? ?3. Rh negative state in antepartum period ?- Antibody screen today, Rhogam at next visit  ? ?4. History of  preterm premature rupture of membranes (PPROM) ?- Makena today, will have one more dose of remaining medication at next visit  ? ?5. Unwanted fertility ?- BTL consent signed previously, plan to perform with C/S  ? ?6. [redacted] weeks gestation of pregnancy ? ?Preterm labor symptoms and general obstetric precautions including but not limited to vaginal bleeding, contractions, leaking of fluid and fetal movement were reviewed in detail with the patient. ?Please refer to After Visit Summary for other counseling recommendations.  ? ?Return in about 2 weeks (around 05/18/2021) for [facility], any provider, In-Person. ? ?No future appointments. ? ?Vonzella Nipple, PA-C ? ?

## 2021-05-05 LAB — CBC
Hematocrit: 33 % — ABNORMAL LOW (ref 34.0–46.6)
Hemoglobin: 11 g/dL — ABNORMAL LOW (ref 11.1–15.9)
MCH: 31.3 pg (ref 26.6–33.0)
MCHC: 33.3 g/dL (ref 31.5–35.7)
MCV: 94 fL (ref 79–97)
Platelets: 237 10*3/uL (ref 150–450)
RBC: 3.52 x10E6/uL — ABNORMAL LOW (ref 3.77–5.28)
RDW: 11.7 % (ref 11.7–15.4)
WBC: 6.4 10*3/uL (ref 3.4–10.8)

## 2021-05-05 LAB — GLUCOSE TOLERANCE, 2 HOURS W/ 1HR
Glucose, 1 hour: 129 mg/dL (ref 70–179)
Glucose, 2 hour: 134 mg/dL (ref 70–152)
Glucose, Fasting: 80 mg/dL (ref 70–91)

## 2021-05-05 LAB — RPR: RPR Ser Ql: NONREACTIVE

## 2021-05-05 LAB — ANTIBODY SCREEN: Antibody Screen: NEGATIVE

## 2021-05-05 LAB — HIV ANTIBODY (ROUTINE TESTING W REFLEX): HIV Screen 4th Generation wRfx: NONREACTIVE

## 2021-05-11 ENCOUNTER — Ambulatory Visit (INDEPENDENT_AMBULATORY_CARE_PROVIDER_SITE_OTHER): Payer: Medicaid Other | Admitting: *Deleted

## 2021-05-11 VITALS — BP 110/66 | HR 92 | Wt 165.0 lb

## 2021-05-11 DIAGNOSIS — Z3A27 27 weeks gestation of pregnancy: Secondary | ICD-10-CM | POA: Diagnosis not present

## 2021-05-11 DIAGNOSIS — Z8751 Personal history of pre-term labor: Secondary | ICD-10-CM | POA: Diagnosis not present

## 2021-05-11 DIAGNOSIS — O26893 Other specified pregnancy related conditions, third trimester: Secondary | ICD-10-CM | POA: Diagnosis not present

## 2021-05-11 NOTE — Progress Notes (Signed)
? ?  NURSE VISIT- INJECTION ? ?SUBJECTIVE:  ?Kelly Villarreal is a 31 y.o. G24P1101 female here for a Makena and Rocephin for history of preterm birth and Rh neg status during pregnancy. She is [redacted]w[redacted]d pregnant.  ? ?OBJECTIVE:  ?BP 110/66 (BP Location: Right Arm, Patient Position: Sitting, Cuff Size: Normal)   Pulse 92   Wt 165 lb (74.8 kg)   LMP 10/28/2020   BMI 26.63 kg/m?   ?Appears well, in no apparent distress ? ?Injection administered in: Makena in Right arm, Rhophylac in left upper quadrant gluteus.  ? ?No orders of the defined types were placed in this encounter. ? ? ?ASSESSMENT: ?Pregnancy [redacted]w[redacted]d Makena and Rhophylac for history of preterm birth and Rh neg status during pregnancy ?PLAN: ?Follow-up: as scheduled  ? ?Janece Canterbury  ?05/11/2021 ?10:37 AM  ?

## 2021-05-18 ENCOUNTER — Encounter: Payer: Self-pay | Admitting: Women's Health

## 2021-05-19 ENCOUNTER — Encounter: Payer: Self-pay | Admitting: Obstetrics & Gynecology

## 2021-05-19 ENCOUNTER — Ambulatory Visit (INDEPENDENT_AMBULATORY_CARE_PROVIDER_SITE_OTHER): Payer: Medicaid Other | Admitting: Obstetrics & Gynecology

## 2021-05-19 VITALS — BP 102/64 | HR 101 | Wt 166.0 lb

## 2021-05-19 DIAGNOSIS — Z3480 Encounter for supervision of other normal pregnancy, unspecified trimester: Secondary | ICD-10-CM

## 2021-05-19 DIAGNOSIS — Z98891 History of uterine scar from previous surgery: Secondary | ICD-10-CM

## 2021-05-19 NOTE — Progress Notes (Signed)
? ?  LOW-RISK PREGNANCY VISIT ?Patient name: Kelly Villarreal MRN [id number]  Date of birth: 1990-07-11 ?Chief Complaint:   ?Routine Prenatal Visit ? ?History of Present Illness:   ?Kelly Villarreal is a 31 y.o. G27P1101 female at [redacted]w[redacted]d with an Estimated Date of Delivery: 08/04/21 being seen today for ongoing management of a low-risk pregnancy.  ? ?  05/04/2021  ?  9:29 AM 02/02/2021  ?  2:11 PM 05/12/2020  ? 10:03 AM 03/23/2020  ? 11:01 AM 02/10/2019  ?  3:17 PM  ?Depression screen PHQ 2/9  ?Decreased Interest 0 0 0 0 0  ?Down, Depressed, Hopeless 0 0 0 0 0  ?PHQ - 2 Score 0 0 0 0 0  ?Altered sleeping 0 0  0   ?Tired, decreased energy 2 2  1    ?Change in appetite 0 0  0   ?Feeling bad or failure about yourself  0 0  0   ?Trouble concentrating 0 0  0   ?Moving slowly or fidgety/restless 0 0  0   ?Suicidal thoughts 0 0  0   ?PHQ-9 Score 2 2  1    ? ? ?Today she reports no complaints. Contractions: Not present. Vag. Bleeding: None.  Movement: Present. denies leaking of fluid. ?Review of Systems:   ?Pertinent items are noted in HPI ?Denies abnormal vaginal discharge w/ itching/odor/irritation, headaches, visual changes, shortness of breath, chest pain, abdominal pain, severe nausea/vomiting, or problems with urination or bowel movements unless otherwise stated above. ?Pertinent History Reviewed:  ?Reviewed past medical,surgical, social, obstetrical and family history.  ?Reviewed problem list, medications and allergies. ?Physical Assessment:  ? ?Vitals:  ? 05/19/21 1016  ?BP: 102/64  ?Pulse: (!) 101  ?Weight: 166 lb (75.3 kg)  ?Body mass index is 26.79 kg/m?. ?  ?     Physical Examination:  ? General appearance: Well appearing, and in no distress ? Mental status: Alert, oriented to person, place, and time ? Skin: Warm & dry ? Cardiovascular: Normal heart rate noted ? Respiratory: Normal respiratory effort, no distress ? Abdomen: Soft, gravid, nontender ? Pelvic: Cervical exam deferred        ? Extremities: Edema: Trace ? ?Fetal Status:  Fetal Heart Rate (bpm): 135 Fundal Height: 30 cm Movement: Present   ? ?Chaperone: n/a   ? ?No results found for this or any previous visit (from the past 24 hour(s)).  ?Assessment & Plan:  ?1) Low-risk pregnancy G3P1101 at [redacted]w[redacted]d with an Estimated Date of Delivery: 08/04/21  ? ?  ICD-10-CM   ?1. Supervision of other normal pregnancy, antepartum  Z34.80   ?  ?2. History of cesarean section, classical  Z98.891   ? 24 weeks, neonatal demise-->repeat at 77 weeks-->07/14/21  ?  ? ? ?  ?Meds: No orders of the defined types were placed in this encounter. ? ?Labs/procedures today:  ? ?Plan:  Continue routine obstetrical care  ?Next visit: prefers in person   ? ?Reviewed: Preterm labor symptoms and general obstetric precautions including but not limited to vaginal bleeding, contractions, leaking of fluid and fetal movement were reviewed in detail with the patient.  All questions were answered. Has home bp cuff. Rx faxed to . Check bp weekly, let 30 know if >140/90.  ? ?Follow-up: Return in about 3 weeks (around 06/09/2021) for LROB. ? ?No orders of the defined types were placed in this encounter. ? ? ?[place], MD ?05/19/2021 ?10:38 AM ? ?

## 2021-06-09 ENCOUNTER — Ambulatory Visit (INDEPENDENT_AMBULATORY_CARE_PROVIDER_SITE_OTHER): Payer: Medicaid Other | Admitting: Advanced Practice Midwife

## 2021-06-09 VITALS — BP 103/63 | HR 92 | Wt 171.0 lb

## 2021-06-09 DIAGNOSIS — Z3483 Encounter for supervision of other normal pregnancy, third trimester: Secondary | ICD-10-CM

## 2021-06-09 DIAGNOSIS — Z6791 Unspecified blood type, Rh negative: Secondary | ICD-10-CM

## 2021-06-09 DIAGNOSIS — O26899 Other specified pregnancy related conditions, unspecified trimester: Secondary | ICD-10-CM

## 2021-06-09 DIAGNOSIS — Z3A32 32 weeks gestation of pregnancy: Secondary | ICD-10-CM

## 2021-06-09 NOTE — Progress Notes (Signed)
   LOW-RISK PREGNANCY VISIT Patient name: Kelly Villarreal MRN [id number]  Date of birth: 06/02/1990 Chief Complaint:   Routine Prenatal Visit  History of Present Illness:   Kelly Villarreal is a 31 y.o. G80P1101 female at [redacted]w[redacted]d with an Estimated Date of Delivery: 08/04/21 being seen today for ongoing management of a low-risk pregnancy.  Today she reports some left sided groin pain. Contractions: Not present. Vag. Bleeding: None.   . denies leaking of fluid. Review of Systems:   Pertinent items are noted in HPI Denies abnormal vaginal discharge w/ itching/odor/irritation, headaches, visual changes, shortness of breath, chest pain, abdominal pain, severe nausea/vomiting, or problems with urination or bowel movements unless otherwise stated above. Pertinent History Reviewed:  Reviewed past medical,surgical, social, obstetrical and family history.  Reviewed problem list, medications and allergies. Physical Assessment:   Vitals:   06/09/21 1330  BP: 103/63  Pulse: 92  Weight: 171 lb (77.6 kg)  Body mass index is 27.6 kg/m.        Physical Examination:   General appearance: Well appearing, and in no distress  Mental status: Alert, oriented to person, place, and time  Skin: Warm & dry  Cardiovascular: Normal heart rate noted  Respiratory: Normal respiratory effort, no distress  Abdomen: Soft, gravid, nontender  Pelvic: Cervical exam deferred         Extremities: Edema: Trace  Fetal Status: Fetal Heart Rate (bpm): 130 Fundal Height: 31 cm      Chaperone: n/a    No results found for this or any previous visit (from the past 24 hour(s)).  Assessment & Plan:  1) Low-risk pregnancy G3P1101 at [redacted]w[redacted]d with an Estimated Date of Delivery: 08/04/21      Meds: No orders of the defined types were placed in this encounter.  Labs/procedures today: none  Plan:  Continue routine obstetrical care  Next visit: prefers in person    Reviewed: Preterm labor symptoms and general obstetric precautions  including but not limited to vaginal bleeding, contractions, leaking of fluid and fetal movement were reviewed in detail with the patient.  All questions were answered. Has home bp cuff. . Check bp weekly, let us know if >140/90.   Follow-up: Return in about 2 weeks (around 06/23/2021) for [place].  No orders of the defined types were placed in this encounter.  Christin Fudge DNP, CNM 06/09/2021 4:44 PM

## 2021-06-09 NOTE — Patient Instructions (Signed)
Kelly Villarreal, I greatly value your feedback.  If you receive a survey following your visit with [place] today, we appreciate you taking the time to fill it out.  Thanks, Nigel Berthold, CNM   [facility] It is now [place] at [facility] ([place], [place] [address]) Entrance located off of [place] parking   Go to [email or web address] to register for FREE online childbirth classes    Call the office [phone]) or go to [facility] if: You begin to have strong, frequent contractions Your water breaks.  Sometimes it is a big gush of fluid, sometimes it is just a trickle that keeps getting your panties wet or running down your legs You have vaginal bleeding.  It is normal to have a small amount of spotting if your cervix was checked.  You don't feel your baby moving like normal.  If you don't, get you something to eat and drink and lay down and focus on feeling your baby move.  You should feel at least 10 movements in 2 hours.  If you don't, you should call the office or go to [facility].    Tdap Vaccine It is recommended that you get the Tdap vaccine during the third trimester of EACH pregnancy to help protect your baby from getting pertussis (whooping cough) 27-36 weeks is the BEST time to do this so that you can pass the protection on to your baby. During pregnancy is better than after pregnancy, but if you are unable to get it during pregnancy it will be offered at the hospital.  You will be offered this vaccine in the office after 27 weeks. If you do not have health insurance, you can get this vaccine at the health department or your family doctor Everyone who will be around your baby should also be up-to-date on their vaccines. Adults (who are not pregnant) only need 1 dose of Tdap during adulthood.   Third Trimester of Pregnancy The third trimester is from week 29 through week 42, months 7 through 9. The third  trimester is a time when the fetus is growing rapidly. At the end of the ninth month, the fetus is about 20 inches in length and weighs 6-10 pounds.  BODY CHANGES Your body goes through many changes during pregnancy. The changes vary from woman to woman.  Your weight will continue to increase. You can expect to gain 25-35 pounds (11-16 kg) by the end of the pregnancy. You may begin to get stretch marks on your hips, abdomen, and breasts. You may urinate more often because the fetus is moving lower into your pelvis and pressing on your bladder. You may develop or continue to have heartburn as a result of your pregnancy. You may develop constipation because certain hormones are causing the muscles that push waste through your intestines to slow down. You may develop hemorrhoids or swollen, bulging veins (varicose veins). You may have pelvic pain because of the weight gain and pregnancy hormones relaxing your joints between the bones in your pelvis. Backaches may result from overexertion of the muscles supporting your posture. You may have changes in your hair. These can include thickening of your hair, rapid growth, and changes in texture. Some women also have hair loss during or after pregnancy, or hair that feels dry or thin. Your hair will most likely return to normal after your baby is born. Your breasts will continue to grow and be tender. A  yellow discharge may leak from your breasts called colostrum. Your belly button may stick out. You may feel short of breath because of your expanding uterus. You may notice the fetus "dropping," or moving lower in your abdomen. You may have a bloody mucus discharge. This usually occurs a few days to a week before labor begins. Your cervix becomes thin and soft (effaced) near your due date. WHAT TO EXPECT AT YOUR PRENATAL EXAMS  You will have prenatal exams every 2 weeks until week 36. Then, you will have weekly prenatal exams. During a routine prenatal  visit: You will be weighed to make sure you and the fetus are growing normally. Your blood pressure is taken. Your abdomen will be measured to track your baby's growth. The fetal heartbeat will be listened to. Any test results from the previous visit will be discussed. You may have a cervical check near your due date to see if you have effaced. At around 36 weeks, your caregiver will check your cervix. At the same time, your caregiver will also perform a test on the secretions of the vaginal tissue. This test is to determine if a type of bacteria, Group B streptococcus, is present. Your caregiver will explain this further. Your caregiver may ask you: What your birth plan is. How you are feeling. If you are feeling the baby move. If you have had any abnormal symptoms, such as leaking fluid, bleeding, severe headaches, or abdominal cramping. If you have any questions. Other tests or screenings that may be performed during your third trimester include: Blood tests that check for low iron levels (anemia). Fetal testing to check the health, activity level, and growth of the fetus. Testing is done if you have certain medical conditions or if there are problems during the pregnancy. FALSE LABOR You may feel small, irregular contractions that eventually go away. These are called Braxton Hicks contractions, or false labor. Contractions may last for hours, days, or even weeks before true labor sets in. If contractions come at regular intervals, intensify, or become painful, it is best to be seen by your caregiver.  SIGNS OF LABOR  Menstrual-like cramps. Contractions that are 5 minutes apart or less. Contractions that start on the top of the uterus and spread down to the lower abdomen and back. A sense of increased pelvic pressure or back pain. A watery or bloody mucus discharge that comes from the vagina. If you have any of these signs before the 37th week of pregnancy, call your caregiver right away.  You need to go to the hospital to get checked immediately. HOME CARE INSTRUCTIONS  Avoid all smoking, herbs, alcohol, and unprescribed drugs. These chemicals affect the formation and growth of the baby. Follow your caregiver's instructions regarding medicine use. There are medicines that are either safe or unsafe to take during pregnancy. Exercise only as directed by your caregiver. Experiencing uterine cramps is a good sign to stop exercising. Continue to eat regular, healthy meals. Wear a good support bra for breast tenderness. Do not use hot tubs, steam rooms, or saunas. Wear your seat belt at all times when driving. Avoid raw meat, uncooked cheese, cat litter boxes, and soil used by cats. These carry germs that can cause birth defects in the baby. Take your prenatal vitamins. Try taking a stool softener (if your caregiver approves) if you develop constipation. Eat more high-fiber foods, such as fresh vegetables or fruit and whole grains. Drink plenty of fluids to keep your urine clear or pale yellow.  Take warm sitz baths to soothe any pain or discomfort caused by hemorrhoids. Use hemorrhoid cream if your caregiver approves. If you develop varicose veins, wear support hose. Elevate your feet for 15 minutes, 3-4 times a day. Limit salt in your diet. Avoid heavy lifting, wear low heal shoes, and practice good posture. Rest a lot with your legs elevated if you have leg cramps or low back pain. Visit your dentist if you have not gone during your pregnancy. Use a soft toothbrush to brush your teeth and be gentle when you floss. A sexual relationship may be continued unless your caregiver directs you otherwise. Do not travel far distances unless it is absolutely necessary and only with the approval of your caregiver. Take prenatal classes to understand, practice, and ask questions about the labor and delivery. Make a trial run to the hospital. Pack your hospital bag. Prepare the baby's  nursery. Continue to go to all your prenatal visits as directed by your caregiver. SEEK MEDICAL CARE IF: You are unsure if you are in labor or if your water has broken. You have dizziness. You have mild pelvic cramps, pelvic pressure, or nagging pain in your abdominal area. You have persistent nausea, vomiting, or diarrhea. You have a bad smelling vaginal discharge. You have pain with urination. SEEK IMMEDIATE MEDICAL CARE IF:  You have a fever. You are leaking fluid from your vagina. You have spotting or bleeding from your vagina. You have severe abdominal cramping or pain. You have rapid weight loss or gain. You have shortness of breath with chest pain. You notice sudden or extreme swelling of your face, hands, ankles, feet, or legs. You have not felt your baby move in over an hour. You have severe headaches that do not go away with medicine. You have vision changes. Document Released: 12/27/2000 Document Revised: 01/07/2013 Document Reviewed: 03/05/2012 [facility] Patient Information 2015 [place], [place]. This information is not intended to replace advice given to you by your health care provider. Make sure you discuss any questions you have with your health care provider.

## 2021-06-23 ENCOUNTER — Telehealth (HOSPITAL_COMMUNITY): Payer: Self-pay | Admitting: *Deleted

## 2021-06-23 ENCOUNTER — Encounter: Payer: Self-pay | Admitting: Advanced Practice Midwife

## 2021-06-23 ENCOUNTER — Ambulatory Visit (INDEPENDENT_AMBULATORY_CARE_PROVIDER_SITE_OTHER): Payer: Medicaid Other | Admitting: Advanced Practice Midwife

## 2021-06-23 ENCOUNTER — Encounter (HOSPITAL_COMMUNITY): Payer: Self-pay

## 2021-06-23 VITALS — BP 111/70 | HR 92 | Wt 174.0 lb

## 2021-06-23 DIAGNOSIS — Z3A34 34 weeks gestation of pregnancy: Secondary | ICD-10-CM

## 2021-06-23 DIAGNOSIS — Z3480 Encounter for supervision of other normal pregnancy, unspecified trimester: Secondary | ICD-10-CM

## 2021-06-23 NOTE — Patient Instructions (Signed)
Genevia R Dreyfuss  06/23/2021   Your procedure is scheduled on:  07/14/2021  Arrive at 0730 at Entrance C on [address] at [facility]  and [employer]. You are invited to use the FREE valet parking or use the Visitor's parking deck.  Pick up the phone at the desk and dial [phone].  Call this number if you have problems the morning of surgery: [phone]  Remember:   Do not eat food:(After Midnight) Desps de medianoche.  Do not drink clear liquids: (After Midnight) Desps de medianoche.  Take these medicines the morning of surgery with A SIP OF WATER:  none   Do not wear jewelry, make-up or nail polish.  Do not wear lotions, powders, or perfumes. Do not wear deodorant.  Do not shave 48 hours prior to surgery.  Do not bring valuables to the hospital.  [facility] is not   responsible for any belongings or valuables brought to the hospital.  Contacts, dentures or bridgework may not be worn into surgery.  Leave suitcase in the car. After surgery it may be brought to your room.  For patients admitted to the hospital, checkout time is 11:00 AM the day of              discharge.      Please read over the following fact sheets that you were given:     Preparing for Surgery

## 2021-06-23 NOTE — Telephone Encounter (Signed)
Preadmission screen  

## 2021-06-23 NOTE — Patient Instructions (Signed)
Kelly Villarreal, I greatly value your feedback.  If you receive a survey following your visit with [place] today, we appreciate you taking the time to fill it out.  Thanks, Cathie Beams, DNP, CNM  [facility] HAS MOVED!!! It is now [facility] at [facility] ([address] [place], [place] [address]) Entrance located off of [address] Free 24/7 valet parking   Go to [email or web address] to register for FREE online childbirth classes    Call the office [phone]) or go to [facility] if: You begin to have strong, frequent contractions Your water breaks.  Sometimes it is a big gush of fluid, sometimes it is just a trickle that keeps getting your panties wet or running down your legs You have vaginal bleeding.  It is normal to have a small amount of spotting if your cervix was checked.  You don't feel your baby moving like normal.  If you don't, get you something to eat and drink and lay down and focus on feeling your baby move.  You should feel at least 10 movements in 2 hours.  If you don't, you should call the office or go to [facility].   Home Blood Pressure Monitoring for Patients   Your provider has recommended that you check your blood pressure (BP) at least once a week at home. If you do not have a blood pressure cuff at home, one will be provided for you. Contact your provider if you have not received your monitor within 1 week.   Helpful Tips for Accurate Home Blood Pressure Checks  Don't smoke, exercise, or drink caffeine 30 minutes before checking your BP Use the restroom before checking your BP (a full bladder can raise your pressure) Relax in a comfortable upright chair Feet on the ground Left arm resting comfortably on a flat surface at the level of your heart Legs uncrossed Back supported Sit quietly and don't talk Place the cuff on your bare arm Adjust snuggly, so that only two fingertips can fit between your skin and the top  of the cuff Check 2 readings separated by at least one minute Keep a log of your BP readings For a visual, please reference this diagram: http://[facility]nc.care/bpdiagram  Provider Name: [facility]     Phone: [phone]  Zone 1: ALL CLEAR  Continue to monitor your symptoms:  BP reading is less than 140 (top number) or less than 90 (bottom number)  No right upper stomach pain No headaches or seeing spots No feeling nauseated or throwing up No swelling in face and hands  Zone 2: CAUTION Call your doctor's office for any of the following:  BP reading is greater than 140 (top number) or greater than 90 (bottom number)  Stomach pain under your ribs in the middle or right side Headaches or seeing spots Feeling nauseated or throwing up Swelling in face and hands  Zone 3: EMERGENCY  Seek immediate medical care if you have any of the following:  BP reading is greater than160 (top number) or greater than 110 (bottom number) Severe headaches not improving with Tylenol Serious difficulty catching your breath Any worsening symptoms from Zone 2

## 2021-06-23 NOTE — Progress Notes (Signed)
   LOW-RISK PREGNANCY VISIT Patient name: Kelly Villarreal MRN [id number]  Date of birth: Nov 07, 1990 Chief Complaint:   Routine Prenatal Visit (Sharp pain around umbilicus)  History of Present Illness:   Kelly Villarreal is a 31 y.o. G71P1101 female at [redacted]w[redacted]d with an Estimated Date of Delivery: 08/04/21 being seen today for ongoing management of a low-risk pregnancy.  Today she reports no complaints. Contractions: Not present. Vag. Bleeding: None.  Movement: Present. denies leaking of fluid. Review of Systems:   Pertinent items are noted in HPI Denies abnormal vaginal discharge w/ itching/odor/irritation, headaches, visual changes, shortness of breath, chest pain, abdominal pain, severe nausea/vomiting, or problems with urination or bowel movements unless otherwise stated above. Pertinent History Reviewed:  Reviewed past medical,surgical, social, obstetrical and family history.  Reviewed problem list, medications and allergies. Physical Assessment:   Vitals:   06/23/21 1333  BP: 111/70  Pulse: 92  Weight: 174 lb (78.9 kg)  Body mass index is 28.08 kg/m.        Physical Examination:   General appearance: Well appearing, and in no distress  Mental status: Alert, oriented to person, place, and time  Skin: Warm & dry  Cardiovascular: Normal heart rate noted  Respiratory: Normal respiratory effort, no distress  Abdomen: Soft, gravid, nontender  Pelvic: Cervical exam deferred         Extremities: Edema: None  Fetal Status: Fetal Heart Rate (bpm): 135 Fundal Height: 33 cm Movement: Present    Chaperone: n/a    No results found for this or any previous visit (from the past 24 hour(s)).  Assessment & Plan:  1) Low-risk pregnancy G3P1101 at [redacted]w[redacted]d with an Estimated Date of Delivery: 08/04/21      Meds: No orders of the defined types were placed in this encounter.  Labs/procedures today: none  Plan:  Continue routine obstetrical care  Next visit: prefers in person    Reviewed: Preterm  labor symptoms and general obstetric precautions including but not limited to vaginal bleeding, contractions, leaking of fluid and fetal movement were reviewed in detail with the patient.  All questions were answered. Has home bp cuff.. Check bp weekly, let us know if >140/90.   Follow-up: Return for As scheduled.  No orders of the defined types were placed in this encounter.  Jacklyn Shell DNP, CNM 06/23/2021 1:49 PM

## 2021-06-24 ENCOUNTER — Telehealth (HOSPITAL_COMMUNITY): Payer: Self-pay | Admitting: *Deleted

## 2021-06-24 NOTE — Telephone Encounter (Signed)
Preadmission screen  

## 2021-06-28 ENCOUNTER — Encounter (HOSPITAL_COMMUNITY): Payer: Self-pay

## 2021-07-07 ENCOUNTER — Encounter: Payer: Self-pay | Admitting: Obstetrics & Gynecology

## 2021-07-07 ENCOUNTER — Ambulatory Visit (INDEPENDENT_AMBULATORY_CARE_PROVIDER_SITE_OTHER): Payer: Medicaid Other | Admitting: Obstetrics & Gynecology

## 2021-07-07 ENCOUNTER — Other Ambulatory Visit (HOSPITAL_COMMUNITY)
Admission: RE | Admit: 2021-07-07 | Discharge: 2021-07-07 | Disposition: A | Discharge status: Home / Self Care | Payer: Medicaid Other | Source: Ambulatory Visit | Attending: Obstetrics & Gynecology | Admitting: Obstetrics & Gynecology

## 2021-07-07 VITALS — BP 120/78 | HR 106 | Wt 178.0 lb

## 2021-07-07 DIAGNOSIS — Z3A36 36 weeks gestation of pregnancy: Secondary | ICD-10-CM | POA: Diagnosis present

## 2021-07-07 DIAGNOSIS — Z3480 Encounter for supervision of other normal pregnancy, unspecified trimester: Secondary | ICD-10-CM

## 2021-07-07 DIAGNOSIS — Z3493 Encounter for supervision of normal pregnancy, unspecified, third trimester: Secondary | ICD-10-CM | POA: Insufficient documentation

## 2021-07-07 DIAGNOSIS — Z98891 History of uterine scar from previous surgery: Secondary | ICD-10-CM

## 2021-07-08 LAB — CERVICOVAGINAL ANCILLARY ONLY
Chlamydia: NEGATIVE
Comment: NEGATIVE
Comment: NORMAL
Neisseria Gonorrhea: NEGATIVE

## 2021-07-11 LAB — CULTURE, BETA STREP (GROUP B ONLY): Strep Gp B Culture: NEGATIVE

## 2021-07-12 ENCOUNTER — Encounter (HOSPITAL_COMMUNITY)
Admission: RE | Admit: 2021-07-12 | Discharge: 2021-07-12 | Disposition: A | Discharge status: Home / Self Care | Payer: Medicaid Other | Source: Ambulatory Visit | Attending: Obstetrics & Gynecology | Admitting: Obstetrics & Gynecology

## 2021-07-12 ENCOUNTER — Other Ambulatory Visit: Payer: Self-pay | Admitting: Family Medicine

## 2021-07-12 DIAGNOSIS — Z98891 History of uterine scar from previous surgery: Secondary | ICD-10-CM | POA: Diagnosis not present

## 2021-07-12 DIAGNOSIS — Z01812 Encounter for preprocedural laboratory examination: Secondary | ICD-10-CM | POA: Diagnosis present

## 2021-07-12 LAB — CBC
HCT: 32.8 % — ABNORMAL LOW (ref 36.0–46.0)
Hemoglobin: 10.4 g/dL — ABNORMAL LOW (ref 12.0–15.0)
MCH: 28 pg (ref 26.0–34.0)
MCHC: 31.7 g/dL (ref 30.0–36.0)
MCV: 88.4 fL (ref 80.0–100.0)
Platelets: 222 10*3/uL (ref 150–400)
RBC: 3.71 MIL/uL — ABNORMAL LOW (ref 3.87–5.11)
RDW: 12.9 % (ref 11.5–15.5)
WBC: 7.5 10*3/uL (ref 4.0–10.5)
nRBC: 0 % (ref 0.0–0.2)

## 2021-07-12 LAB — RPR: RPR Ser Ql: NONREACTIVE

## 2021-07-12 LAB — TYPE AND SCREEN
ABO/RH(D): O NEG
Antibody Screen: POSITIVE

## 2021-07-13 NOTE — H&P (Shared)
OBSTETRIC ADMISSION HISTORY AND PHYSICAL  Kelly Villarreal is a 31 y.o. female G45P1101 with IUP at [redacted]w[redacted]d by LMP and confirmed by 32 WK [place] presenting for scheduled repeat cesarean delivery (2 prior CS one of which was a classical CS). She reports +FMs, No LOF, no VB, no blurry vision, headaches or peripheral edema, and RUQ pain.  She plans on breast feeding. She request  BTL for birth control. She received her prenatal care at [facility]   Dating: By LMP and early [place] --->  Estimated Date of Delivery: 08/04/21  Sono:   @[redacted]w[redacted]d , CWD, normal anatomy, cephalic presentation, posterior placental  lie, 312g, 91% EFW   Prenatal History/Complications:  RH neg Hx of IUFD at 24 weeks in setting of PPROM  Past Medical History: Past Medical History:  Diagnosis Date   Allergy    seasonal   Anxiety 03/08/2011   Depression 03/08/2011   Grief reaction 05/31/2015   History of classical cesarean section 03/20/2018   2016: PPROM/abruption @ 24.5wks     2020: LTCS @ 37wks   History of preterm delivery 03/20/2018   24.5wks cramping>PPROM>low-lying placenta>abruption, neonatal demise        Nausea and vomiting during pregnancy 08/10/2014   Threatened miscarriage in early pregnancy 08/10/2014   Vaginal bleeding in pregnancy 08/10/2014    Past Surgical History: Past Surgical History:  Procedure Laterality Date   CESAREAN SECTION N/A 12/16/2014   Procedure: CESAREAN SECTION;  Surgeon: 12/18/2014, MD;  Location: [facility] ORS;  Service: Obstetrics;  Laterality: N/A;   CESAREAN SECTION N/A 10/20/2018   Procedure: REPEAT CESAREAN SECTION;  Surgeon: 12/20/2018, MD;  Location: [facility] LD ORS;  Service: Obstetrics;  Laterality: N/A;   MOUTH SURGERY      Obstetrical History: OB History     Gravida  3   Para  2   Term  1   Preterm  1   AB      Living  1      SAB      IAB      Ectopic      Multiple  0   Live Births  2           Social History Social History   Socioeconomic  History   Marital status: Married    Spouse name: [place]   Number of children: 1   Years of education: Not on file   Highest education level: Not on file  Occupational History   Occupation: Work from home    Comment: payroll and taxes  Tobacco Use   Smoking status: Never   Smokeless tobacco: Never  Vaping Use   Vaping Use: Never used  Substance and Sexual Activity   Alcohol use: Not Currently    Comment: wine - occassionally- about 2 glasses, 3-4 x per week   Drug use: No   Sexual activity: Yes    Partners: Male    Birth control/protection: None  Other Topics Concern   Not on file  Social History Narrative   Not on file   Social Determinants of Health   Financial Resource Strain: Low Risk  (05/04/2021)   Overall Financial Resource Strain (CARDIA)    Difficulty of Paying Living Expenses: Not very hard  Food Insecurity: No Food Insecurity (05/04/2021)   Hunger Vital Sign    Worried About Running Out of Food in the Last Year: Never true    Ran Out of Food in the Last Year: Never true  Transportation  Needs: No Transportation Needs (05/04/2021)   PRAPARE - Administrator, Civil Service (Medical): No    Lack of Transportation (Non-Medical): No  Physical Activity: Insufficiently Active (05/04/2021)   Exercise Vital Sign    Days of Exercise per Week: 2 days    Minutes of Exercise per Session: 20 min  Stress: No Stress Concern Present (05/04/2021)   [employer] of Occupational Health - Occupational Stress Questionnaire    Feeling of Stress : Not at all  Social Connections: Moderately Isolated (05/04/2021)   Social Connection and Isolation Panel [NHANES]    Frequency of Communication with Friends and Family: More than three times a week    Frequency of Social Gatherings with Friends and Family: Once a week    Attends Religious Services: Never    Database administrator or Organizations: No    Attends Engineer, structural: Never    Marital Status: Married     Family History: Family History  Problem Relation Age of Onset   Colon cancer Mother    Hypertension Father    Heart disease Maternal Grandmother    Glaucoma Paternal Grandmother    Diabetes Maternal Grandfather    Hypertension Maternal Grandfather    Hypertension Paternal Aunt    Diabetes Paternal Aunt    Diabetes Paternal Uncle    Hypertension Paternal Uncle     Allergies: No Known Allergies  Medications Prior to Admission  Medication Sig Dispense Refill Last Dose   acetaminophen (TYLENOL) 500 MG tablet Take 1,000 mg by mouth every 8 (eight) hours as needed for moderate pain.   Past Week   Prenatal MV & Min w/FA-DHA (PRENATAL GUMMIES PO) Take 1 each by mouth daily.   07/13/2021   Saline (SIMPLY SALINE) 0.9 % AERS Place 1 spray into the nose as needed (congestion).   Unknown     Review of Systems   All systems reviewed and negative except as stated in HPI  Blood pressure 123/81, pulse 83, temperature 98.5 F (36.9 C), temperature source Oral, resp. rate 20, height 5\' 6"  (1.676 m), weight 178 lb (80.7 kg), last menstrual period 10/28/2020, currently breastfeeding. General appearance: alert Lungs: clear to auscultation bilaterally Heart: regular rate and rhythm Abdomen: soft, non-tender; bowel sounds normal Extremities: Homans sign is negative, no sign of DVT    Prenatal labs: ABO, Rh: --/--/O NEG (06/27 0926) Antibody: POS (06/27 0926) Rubella: 1.50 (01/18 1549) RPR: NON REACTIVE (06/27 0926)  HBsAg: Negative (01/18 1549)  HIV: Non Reactive (04/19 0853)  GBS: Negative/-- (06/22 1450)  2hr Glucola normal Genetic screening  LR NIPS Anatomy 07-08-1972 normal  Prenatal Transfer Tool  Maternal Diabetes: No Genetic Screening: Normal Maternal Ultrasounds/Referrals: Normal Fetal Ultrasounds or other Referrals:  None Maternal Substance Abuse:  No Significant Maternal Medications:  None Significant Maternal Lab Results: Group B Strep negative  No results found for this  or any previous visit (from the past 24 hour(s)).  Patient Active Problem List   Diagnosis Date Noted   Rh negative state in antepartum period 02/02/2021   Encounter for supervision of normal pregnancy, antepartum 01/31/2021   History of cesarean delivery 01/31/2021   History of preterm premature rupture of membranes (PPROM) 01/31/2021   Abdominal pain 05/12/2020   Family history of colon cancer in mother 03/23/2020   Seasonal allergies 04/07/2015    Assessment/Plan:  Kelly Villarreal is a 31 y.o. G3P1101 at [redacted]w[redacted]d here for scheduled repeat CS and BTL for undesired fertility  #Scheduled repeat  CS #Desires BTL Patient with hx of two prior CS, one of which was classical.  The risks of cesarean section were discussed with the patient including but were not limited to: bleeding which may require transfusion or reoperation; infection which may require antibiotics; injury to bowel, bladder, ureters or other surrounding organs; injury to the fetus; need for additional procedures including hysterectomy in the event of a life-threatening hemorrhage; placental abnormalities wth subsequent pregnancies, incisional problems, thromboembolic phenomenon and other postoperative/anesthesia complications.  Patient also desires permanent sterilization.  Other reversible forms of contraception were discussed with patient; she declines all other modalities. Risks of procedure discussed with patient including but not limited to: risk of regret, permanence of method, bleeding, infection, injury to surrounding organs and need for additional procedures.  Failure risk of about 1% with increased risk of ectopic gestation if pregnancy occurs was also discussed with patient.  Also discussed possibility of post-tubal pain syndrome. The patient concurred with the proposed plan, giving informed written consent for the procedures.  Patient has been NPO since last night she will remain NPO for procedure. Anesthesia and OR aware.   Preoperative prophylactic antibiotics and SCDs ordered on call to the OR.  To OR when ready.  #Pain: Spinal #ID:  GBS neg, will get surgical PPX #MOF: breast #MOC:BTL #Circ:  N/A (female infant)  #RH negative Per chart received rhogam on  4/26. Plan for postpartum rhogam study and redose if infant RH positive  Warner Mccreedy, MD, MPH OB Fellow, Faculty Practice  Attestation of Attending Supervision of Fellow: Evaluation and management procedures were performed by the Fellow under my supervision and collaboration. I have reviewed the Fellow's note and chart, and I agree with the management and plan.  Elsie Lincoln, MD

## 2021-07-14 ENCOUNTER — Encounter (HOSPITAL_COMMUNITY): Payer: Self-pay | Admitting: Obstetrics & Gynecology

## 2021-07-14 ENCOUNTER — Inpatient Hospital Stay (HOSPITAL_COMMUNITY): Payer: Medicaid Other | Admitting: Anesthesiology

## 2021-07-14 ENCOUNTER — Inpatient Hospital Stay (HOSPITAL_COMMUNITY)
Admission: AD | Admit: 2021-07-14 | Discharge: 2021-07-16 | DRG: 785 | Disposition: A | Discharge status: Home / Self Care | Payer: Medicaid Other | Attending: Obstetrics & Gynecology | Admitting: Obstetrics & Gynecology

## 2021-07-14 ENCOUNTER — Other Ambulatory Visit: Payer: Self-pay

## 2021-07-14 ENCOUNTER — Encounter (HOSPITAL_COMMUNITY)
Admission: AD | Disposition: A | Discharge status: Home / Self Care | Payer: Self-pay | Source: Home / Self Care | Attending: Obstetrics & Gynecology

## 2021-07-14 DIAGNOSIS — Z302 Encounter for sterilization: Secondary | ICD-10-CM | POA: Diagnosis not present

## 2021-07-14 DIAGNOSIS — O34211 Maternal care for low transverse scar from previous cesarean delivery: Secondary | ICD-10-CM | POA: Diagnosis present

## 2021-07-14 DIAGNOSIS — O26899 Other specified pregnancy related conditions, unspecified trimester: Secondary | ICD-10-CM

## 2021-07-14 DIAGNOSIS — Z3490 Encounter for supervision of normal pregnancy, unspecified, unspecified trimester: Secondary | ICD-10-CM

## 2021-07-14 DIAGNOSIS — Z3A37 37 weeks gestation of pregnancy: Secondary | ICD-10-CM

## 2021-07-14 DIAGNOSIS — O26893 Other specified pregnancy related conditions, third trimester: Secondary | ICD-10-CM | POA: Diagnosis present

## 2021-07-14 DIAGNOSIS — Z6791 Unspecified blood type, Rh negative: Secondary | ICD-10-CM

## 2021-07-14 DIAGNOSIS — O34219 Maternal care for unspecified type scar from previous cesarean delivery: Secondary | ICD-10-CM

## 2021-07-14 DIAGNOSIS — Z8759 Personal history of other complications of pregnancy, childbirth and the puerperium: Secondary | ICD-10-CM

## 2021-07-14 DIAGNOSIS — Z3480 Encounter for supervision of other normal pregnancy, unspecified trimester: Secondary | ICD-10-CM

## 2021-07-14 DIAGNOSIS — Z98891 History of uterine scar from previous surgery: Secondary | ICD-10-CM

## 2021-07-14 SURGERY — Surgical Case
Anesthesia: Spinal | Wound class: Clean Contaminated

## 2021-07-14 MED ORDER — ONDANSETRON HCL 4 MG/2ML IJ SOLN
INTRAMUSCULAR | Status: DC | PRN
  Administered 2021-07-14: 4 mg via INTRAVENOUS

## 2021-07-14 MED ORDER — ENOXAPARIN SODIUM 40 MG/0.4ML IJ SOSY
40.0000 mg | PREFILLED_SYRINGE | INTRAMUSCULAR | Status: DC
  Administered 2021-07-15 – 2021-07-16 (×2): 40 mg via SUBCUTANEOUS
  Filled 2021-07-14 (×2): qty 0.4

## 2021-07-14 MED ORDER — ACETAMINOPHEN 500 MG PO TABS
1000.0000 mg | ORAL_TABLET | Freq: Four times a day (QID) | ORAL | Status: DC
  Administered 2021-07-14 – 2021-07-16 (×7): 1000 mg via ORAL
  Filled 2021-07-14 (×8): qty 2

## 2021-07-14 MED ORDER — ONDANSETRON HCL 4 MG/2ML IJ SOLN
INTRAMUSCULAR | Status: AC
  Filled 2021-07-14: qty 2

## 2021-07-14 MED ORDER — CEFAZOLIN SODIUM-DEXTROSE 2-4 GM/100ML-% IV SOLN
2.0000 g | INTRAVENOUS | Status: AC
  Administered 2021-07-14: 2 g via INTRAVENOUS

## 2021-07-14 MED ORDER — FENTANYL CITRATE (PF) 100 MCG/2ML IJ SOLN: 25.0000 ug | INTRAMUSCULAR | Status: DC | PRN

## 2021-07-14 MED ORDER — CEFAZOLIN SODIUM-DEXTROSE 2-4 GM/100ML-% IV SOLN
INTRAVENOUS | Status: AC
  Filled 2021-07-14: qty 100

## 2021-07-14 MED ORDER — IBUPROFEN 600 MG PO TABS
600.0000 mg | ORAL_TABLET | Freq: Four times a day (QID) | ORAL | Status: DC
  Administered 2021-07-15: 600 mg via ORAL

## 2021-07-14 MED ORDER — SCOPOLAMINE 1 MG/3DAYS TD PT72
MEDICATED_PATCH | TRANSDERMAL | Status: AC
  Filled 2021-07-14: qty 1

## 2021-07-14 MED ORDER — OXYCODONE HCL 5 MG PO TABS: 5.0000 mg | ORAL_TABLET | Freq: Four times a day (QID) | ORAL | Status: DC | PRN

## 2021-07-14 MED ORDER — LACTATED RINGERS IV SOLN: INTRAVENOUS | Status: DC

## 2021-07-14 MED ORDER — ACETAMINOPHEN 10 MG/ML IV SOLN
1000.0000 mg | Freq: Once | INTRAVENOUS | Status: DC | PRN
  Administered 2021-07-14: 1000 mg via INTRAVENOUS

## 2021-07-14 MED ORDER — MORPHINE SULFATE (PF) 0.5 MG/ML IJ SOLN
INTRAMUSCULAR | Status: DC | PRN
  Administered 2021-07-14: 150 ug via INTRATHECAL

## 2021-07-14 MED ORDER — ACETAMINOPHEN 10 MG/ML IV SOLN
INTRAVENOUS | Status: AC
  Filled 2021-07-14: qty 100

## 2021-07-14 MED ORDER — FENTANYL CITRATE (PF) 100 MCG/2ML IJ SOLN
INTRAMUSCULAR | Status: DC | PRN
Start: 2021-07-14 — End: 2021-07-14
  Administered 2021-07-14: 15 ug via INTRATHECAL

## 2021-07-14 MED ORDER — FENTANYL CITRATE (PF) 100 MCG/2ML IJ SOLN
INTRAMUSCULAR | Status: AC
  Filled 2021-07-14: qty 2

## 2021-07-14 MED ORDER — POVIDONE-IODINE 10 % EX SWAB
2.0000 | Freq: Once | CUTANEOUS | Status: AC
  Administered 2021-07-14: 2 via TOPICAL

## 2021-07-14 MED ORDER — MENTHOL 3 MG MT LOZG: 1.0000 | LOZENGE | OROMUCOSAL | Status: DC | PRN

## 2021-07-14 MED ORDER — ZOLPIDEM TARTRATE 5 MG PO TABS: 5.0000 mg | ORAL_TABLET | Freq: Every evening | ORAL | Status: DC | PRN

## 2021-07-14 MED ORDER — ACETAMINOPHEN 160 MG/5ML PO SOLN: 1000.0000 mg | Freq: Once | ORAL | Status: DC | PRN

## 2021-07-14 MED ORDER — DIBUCAINE (PERIANAL) 1 % EX OINT: 1.0000 | TOPICAL_OINTMENT | CUTANEOUS | Status: DC | PRN

## 2021-07-14 MED ORDER — PHENYLEPHRINE HCL-NACL 20-0.9 MG/250ML-% IV SOLN
INTRAVENOUS | Status: DC | PRN
  Administered 2021-07-14: 60 ug/min via INTRAVENOUS

## 2021-07-14 MED ORDER — MORPHINE SULFATE (PF) 0.5 MG/ML IJ SOLN
INTRAMUSCULAR | Status: AC
  Filled 2021-07-14: qty 10

## 2021-07-14 MED ORDER — WITCH HAZEL-GLYCERIN EX PADS: 1.0000 | MEDICATED_PAD | CUTANEOUS | Status: DC | PRN

## 2021-07-14 MED ORDER — COCONUT OIL OIL: 1.0000 | TOPICAL_OIL | Status: DC | PRN

## 2021-07-14 MED ORDER — ACETAMINOPHEN 500 MG PO TABS: 1000.0000 mg | ORAL_TABLET | Freq: Once | ORAL | Status: DC | PRN

## 2021-07-14 MED ORDER — KETOROLAC TROMETHAMINE 30 MG/ML IJ SOLN
30.0000 mg | Freq: Four times a day (QID) | INTRAMUSCULAR | Status: DC
  Administered 2021-07-14 – 2021-07-15 (×3): 30 mg via INTRAVENOUS
  Filled 2021-07-14 (×3): qty 1

## 2021-07-14 MED ORDER — OXYTOCIN-SODIUM CHLORIDE 30-0.9 UT/500ML-% IV SOLN
INTRAVENOUS | Status: DC | PRN
  Administered 2021-07-14: 300 mL via INTRAVENOUS

## 2021-07-14 MED ORDER — DIPHENHYDRAMINE HCL 25 MG PO CAPS
25.0000 mg | ORAL_CAPSULE | Freq: Four times a day (QID) | ORAL | Status: DC | PRN
  Administered 2021-07-15: 25 mg via ORAL
  Filled 2021-07-14: qty 1

## 2021-07-14 MED ORDER — SIMETHICONE 80 MG PO CHEW: 80.0000 mg | CHEWABLE_TABLET | ORAL | Status: DC | PRN

## 2021-07-14 MED ORDER — PHENYLEPHRINE HCL-NACL 20-0.9 MG/250ML-% IV SOLN
INTRAVENOUS | Status: AC
  Filled 2021-07-14: qty 250

## 2021-07-14 MED ORDER — DEXAMETHASONE SODIUM PHOSPHATE 10 MG/ML IJ SOLN
INTRAMUSCULAR | Status: DC | PRN
  Administered 2021-07-14: 4 mg via INTRAVENOUS

## 2021-07-14 MED ORDER — SIMETHICONE 80 MG PO CHEW
80.0000 mg | CHEWABLE_TABLET | Freq: Three times a day (TID) | ORAL | Status: DC
  Administered 2021-07-14 – 2021-07-16 (×6): 80 mg via ORAL
  Filled 2021-07-14 (×7): qty 1

## 2021-07-14 MED ORDER — RHO D IMMUNE GLOBULIN 1500 UNIT/2ML IJ SOSY
300.0000 ug | PREFILLED_SYRINGE | Freq: Once | INTRAMUSCULAR | Status: AC
  Administered 2021-07-15: 300 ug via INTRAVENOUS
  Filled 2021-07-14: qty 2

## 2021-07-14 MED ORDER — OXYTOCIN-SODIUM CHLORIDE 30-0.9 UT/500ML-% IV SOLN
2.5000 [IU]/h | INTRAVENOUS | Status: AC
  Administered 2021-07-14: 2.5 [IU]/h via INTRAVENOUS
  Filled 2021-07-14: qty 500

## 2021-07-14 MED ORDER — PRENATAL MULTIVITAMIN CH
1.0000 | ORAL_TABLET | Freq: Every day | ORAL | Status: DC
  Administered 2021-07-14 – 2021-07-16 (×3): 1 via ORAL
  Filled 2021-07-14 (×3): qty 1

## 2021-07-14 MED ORDER — TETANUS-DIPHTH-ACELL PERTUSSIS 5-2.5-18.5 LF-MCG/0.5 IM SUSY: 0.5000 mL | PREFILLED_SYRINGE | Freq: Once | INTRAMUSCULAR | Status: DC

## 2021-07-14 MED ORDER — BUPIVACAINE IN DEXTROSE 0.75-8.25 % IT SOLN
INTRATHECAL | Status: DC | PRN
  Administered 2021-07-14: 1.6 mL via INTRATHECAL

## 2021-07-14 MED ORDER — SENNOSIDES-DOCUSATE SODIUM 8.6-50 MG PO TABS
2.0000 | ORAL_TABLET | Freq: Every day | ORAL | Status: DC
  Administered 2021-07-15 – 2021-07-16 (×2): 2 via ORAL
  Filled 2021-07-14 (×2): qty 2

## 2021-07-14 SURGICAL SUPPLY — 31 items
BENZOIN TINCTURE PRP APPL 2/3 (GAUZE/BANDAGES/DRESSINGS) ×1 IMPLANT
CHLORAPREP W/TINT 26ML (MISCELLANEOUS) ×4 IMPLANT
CLAMP CORD UMBIL (MISCELLANEOUS) ×2 IMPLANT
CLIP FILSHIE TUBAL LIGA STRL (Clip) ×1 IMPLANT
CLOTH BEACON ORANGE TIMEOUT ST (SAFETY) ×2 IMPLANT
DRAIN JACKSON PRT FLT 7MM (DRAIN) IMPLANT
DRSG OPSITE POSTOP 4X10 (GAUZE/BANDAGES/DRESSINGS) ×2 IMPLANT
DRSG OPSITE POSTOP 4X8 (GAUZE/BANDAGES/DRESSINGS) ×1 IMPLANT
ELECT REM PT RETURN 9FT ADLT (ELECTROSURGICAL) ×2
ELECTRODE REM PT RTRN 9FT ADLT (ELECTROSURGICAL) ×1 IMPLANT
EVACUATOR SILICONE 100CC (DRAIN) IMPLANT
EXTRACTOR VACUUM M CUP 4 TUBE (SUCTIONS) IMPLANT
GLOVE BIO SURGEON STRL SZ7 (GLOVE) ×2 IMPLANT
GLOVE BIOGEL PI IND STRL 7.0 (GLOVE) ×2 IMPLANT
GLOVE BIOGEL PI INDICATOR 7.0 (GLOVE) ×2
GOWN STRL REUS W/TWL LRG LVL3 (GOWN DISPOSABLE) ×4 IMPLANT
KIT ABG SYR 3ML LUER SLIP (SYRINGE) IMPLANT
NDL HYPO 25X5/8 SAFETYGLIDE (NEEDLE) ×1 IMPLANT
NEEDLE HYPO 25X5/8 SAFETYGLIDE (NEEDLE) ×2 IMPLANT
NS IRRIG 1000ML POUR BTL (IV SOLUTION) ×2 IMPLANT
PACK C SECTION WH (CUSTOM PROCEDURE TRAY) ×2 IMPLANT
PAD OB MATERNITY 4.3X12.25 (PERSONAL CARE ITEMS) ×2 IMPLANT
RTRCTR C-SECT PINK 25CM LRG (MISCELLANEOUS) ×2 IMPLANT
STRIP CLOSURE SKIN 1/2X4 (GAUZE/BANDAGES/DRESSINGS) ×1 IMPLANT
SUT VIC AB 0 CT1 36 (SUTURE) ×1 IMPLANT
SUT VIC AB 0 CTX 36 (SUTURE) ×5
SUT VIC AB 0 CTX36XBRD ANBCTRL (SUTURE) ×5 IMPLANT
SUT VIC AB 4-0 KS 27 (SUTURE) ×2 IMPLANT
TOWEL OR 17X24 6PK STRL BLUE (TOWEL DISPOSABLE) ×2 IMPLANT
TRAY FOLEY W/BAG SLVR 14FR LF (SET/KITS/TRAYS/PACK) ×2 IMPLANT
WATER STERILE IRR 1000ML POUR (IV SOLUTION) ×2 IMPLANT

## 2021-07-14 NOTE — Anesthesia Preprocedure Evaluation (Addendum)
Anesthesia Evaluation  Patient identified by MRN, date of birth, ID band Patient awake    Reviewed: Allergy & Precautions, NPO status , Patient's Chart, lab work & pertinent test results  History of Anesthesia Complications Negative for: history of anesthetic complications  Airway Mallampati: II  TM Distance: >3 FB Neck ROM: Full    Dental  (+) Dental Advisory Given, Teeth Intact,    Pulmonary neg pulmonary ROS,    breath sounds clear to auscultation       Cardiovascular negative cardio ROS   Rhythm:Regular     Neuro/Psych negative neurological ROS     GI/Hepatic negative GI ROS, Neg liver ROS,   Endo/Other  negative endocrine ROS  Renal/GU negative Renal ROS     Musculoskeletal   Abdominal   Peds  Hematology negative hematology ROS (+) Lab Results      Component                Value               Date                      WBC                      7.5                 07/12/2021                HGB                      10.4 (L)            07/12/2021                HCT                      32.8 (L)            07/12/2021                MCV                      88.4                07/12/2021                PLT                      222                 07/12/2021              Anesthesia Other Findings Repeat c section  Reproductive/Obstetrics (+) Pregnancy                            Anesthesia Physical Anesthesia Plan  ASA: 2  Anesthesia Plan: Spinal   Post-op Pain Management: Ofirmev IV (intra-op)*   Induction:   PONV Risk Score and Plan: 2 and Ondansetron and Scopolamine patch - Pre-op  Airway Management Planned: Natural Airway and Nasal Cannula  Additional Equipment: None  Intra-op Plan:   Post-operative Plan:   Informed Consent: I have reviewed the patients History and Physical, chart, labs and discussed the procedure including the risks, benefits and alternatives for the  proposed anesthesia with the patient or authorized representative who has indicated his/her understanding  and acceptance.     Dental advisory given  Plan Discussed with: CRNA  Anesthesia Plan Comments: (Spinal with geta as backup, discussed plan with patient)      Anesthesia Quick Evaluation

## 2021-07-14 NOTE — Discharge Summary (Signed)
   Postpartum Discharge Summary     Patient Name: Kelly Villarreal DOB: 11/07/1990 MRN: [id number]  Date of admission: 07/14/2021 Delivery date:07/14/2021  Delivering provider: LEGGETT, KELLY H  Date of discharge: 07/16/2021  Admitting diagnosis: Supervision of other normal pregnancy, antepartum [Z34.80] Intrauterine pregnancy: [redacted]w[redacted]d     Secondary diagnosis:  Principal Problem:   Supervision of other normal pregnancy, antepartum Active Problems:   Encounter for supervision of normal pregnancy, antepartum   History of cesarean delivery   History of preterm premature rupture of membranes (PPROM)   Rh negative state in antepartum period  Additional problems: None    Discharge diagnosis: Term Pregnancy Delivered                                              Post partum procedures: Rhogam Augmentation: N/A Complications: None  Hospital course: Scheduled C/S   31 y.o. yo G3P2102 at [redacted]w[redacted]d was admitted to the hospital 07/14/2021 for scheduled cesarean section with the following indication:Elective Repeat.Delivery details are as follows:  Membrane Rupture Time/Date: 10:08 AM ,07/14/2021   Delivery Method:C-Section, Low Transverse  Details of operation can be found in separate operative note.  Patient had an uncomplicated postpartum course.  She is ambulating, tolerating a regular diet, passing flatus, and urinating well. Her POD#1 Hgb was 9.7 and had been 10.4 preop. Patient is discharged home in stable condition on  07/16/21 per her request for early d/c as long as the baby can go as well.        Newborn Data: Birth date:07/14/2021  Birth time:10:08 AM  Gender:Female  Living status:Living  Apgars:9 ,9  Weight:3010 g (6lb 10.2oz)    Magnesium Sulfate received: No BMZ received: No Rhophylac:Yes MMR:No T-DaP:Given prenatally Flu: No Transfusion:No  Physical exam  Vitals:   07/15/21 0420 07/15/21 1338 07/15/21 2130 07/16/21 0512  BP: 115/73 121/82 121/71 115/78  Pulse: (!) 58 75 71 65   Resp: 17 18  17  Temp: 97.8 F (36.6 C) 98.5 F (36.9 C) 98 F (36.7 C) 98.1 F (36.7 C)  TempSrc: Oral Oral Oral Oral  SpO2: 100%   100%  Weight:      Height:       General: alert and cooperative Lochia: appropriate Uterine Fundus: firm Incision: honeycomb dry and intact DVT Evaluation: No evidence of DVT seen on physical exam. Labs: Lab Results  Component Value Date   WBC 11.8 (H) 07/15/2021   HGB 9.7 (L) 07/15/2021   HCT 29.4 (L) 07/15/2021   MCV 86.2 07/15/2021   PLT 195 07/15/2021      Latest Ref Rng & Units 10/18/2018    9:25 AM  CMP  Glucose 70 - 99 mg/dL 72   BUN 6 - 20 mg/dL <5   Creatinine 0.44 - 1.00 mg/dL 0.54   Sodium 135 - 145 mmol/L 137   Potassium 3.5 - 5.1 mmol/L 3.7   Chloride 98 - 111 mmol/L 109   CO2 22 - 32 mmol/L 21   Calcium 8.9 - 10.3 mg/dL 8.9   Total Protein 6.5 - 8.1 g/dL 5.6   Total Bilirubin 0.3 - 1.2 mg/dL 0.1   Alkaline Phos 38 - 126 U/L 99   AST 15 - 41 U/L 17   ALT 0 - 44 U/L 13    Edinburgh Score:    07/14/2021   12:30 PM  Edinburgh   Postnatal Depression Scale Screening Tool  I have been able to laugh and see the funny side of things. 0  I have looked forward with enjoyment to things. 0  I have blamed myself unnecessarily when things went wrong. 1  I have been anxious or worried for no good reason. 0  I have felt scared or panicky for no good reason. 1  Things have been getting on top of me. 1  I have been so unhappy that I have had difficulty sleeping. 0  I have felt sad or miserable. 0  I have been so unhappy that I have been crying. 0  The thought of harming myself has occurred to me. 0  Edinburgh Postnatal Depression Scale Total 3     After visit meds:  Allergies as of 07/16/2021   No Known Allergies      Medication List     STOP taking these medications    acetaminophen 500 MG tablet Commonly known as: TYLENOL       TAKE these medications    ibuprofen 600 MG tablet Commonly known as: ADVIL Take 1  tablet (600 mg total) by mouth every 6 (six) hours as needed.   iron polysaccharides 150 MG capsule Commonly known as: NIFEREX Take 1 capsule (150 mg total) by mouth every other day.   oxyCODONE 5 MG immediate release tablet Commonly known as: Oxy IR/ROXICODONE Take 1-2 tablets (5-10 mg total) by mouth every 6 (six) hours as needed for severe pain.   PRENATAL GUMMIES PO Take 1 each by mouth daily.   Simply Saline 0.9 % Aers Generic drug: Saline Place 1 spray into the nose as needed (congestion).         Discharge home in stable condition Infant Feeding: Breast Infant Disposition:home with mother Discharge instruction: per After Visit Summary and Postpartum booklet. Activity: Advance as tolerated. Pelvic rest for 6 weeks.  Diet: routine diet Future Appointments: Future Appointments  Date Time Provider Department Center  07/21/2021  9:10 AM Eure, Luther H, MD [facility]  08/12/2021 10:50 AM Shaw, Kimberly D, CNM [facility]   Follow up Visit: Message sent to [facility] by Dr. Das on 6/29  Please schedule this patient for a In person postpartum visit in 4 weeks with the following provider: Any provider. Additional Postpartum F/U: None   Low risk pregnancy complicated by:  None Delivery mode:  C-Section, Low Transverse  Anticipated Birth Control:  BTL done PP(Filshie)   07/16/2021 Kimberly D Shaw, CNM 10:02 AM     

## 2021-07-14 NOTE — Anesthesia Postprocedure Evaluation (Signed)
Anesthesia Post Note  Patient: Kelly Villarreal  Procedure(s) Performed: CESAREAN SECTION WITH BILATERAL TUBAL LIGATION     Patient location during evaluation: PACU Anesthesia Type: Spinal Level of consciousness: awake and alert Pain management: pain level controlled Vital Signs Assessment: post-procedure vital signs reviewed and stable Respiratory status: spontaneous breathing, nonlabored ventilation and respiratory function stable Cardiovascular status: stable and blood pressure returned to baseline Postop Assessment: no apparent nausea or vomiting and spinal receding Anesthetic complications: no   No notable events documented.  Last Vitals:  Vitals:   07/14/21 1215 07/14/21 1230  BP:  107/65  Pulse: 60 63  Resp: 15 16  Temp:  (!) 35.6 C  SpO2: 100% 100%    Last Pain:  Vitals:   07/14/21 1230  TempSrc: Axillary  PainSc: 0-No pain   Pain Goal: Patients Stated Pain Goal: 5 (07/14/21 1130)                 [name]

## 2021-07-14 NOTE — Transfer of Care (Signed)
Immediate Anesthesia Transfer of Care Note  Patient: Kelly Villarreal  Procedure(s) Performed: CESAREAN SECTION WITH BILATERAL TUBAL LIGATION  Patient Location: PACU  Anesthesia Type:Spinal  Level of Consciousness: awake  Airway & Oxygen Therapy: Patient Spontanous Breathing  Post-op Assessment: Report given to RN and Post -op Vital signs reviewed and stable  Post vital signs: Reviewed and stable  Last Vitals:  Vitals Value Taken Time  BP 100/59 07/14/21 1052  Temp    Pulse 72 07/14/21 1054  Resp 22 07/14/21 1054  SpO2 100 % 07/14/21 1054  Vitals shown include unvalidated device data.  Last Pain:  Vitals:   07/14/21 0808  TempSrc: Oral  PainSc: 6       Patients Stated Pain Goal: 5 (07/14/21 9470)  Complications: No notable events documented.

## 2021-07-14 NOTE — Lactation Note (Signed)
This note was copied from a baby's chart. Lactation Consultation Note  Patient Name: Girl Coriana Angello [id number] Date: 07/14/2021 Reason for consult: Initial assessment;Early term 37-38.6wks;Breastfeeding assistance Age:32 hours  P2, Early Term, Infant Female, Infant at 2 hours old  Per mom, breastfeeding is going well so far. Dad states that baby latched for 15 min during recovery and did not come off the breast. Mom states that she is comfortable with hand expression and breastfeeding. She states that she would like to be seen PRN by lactation.   Per the RN, mom has 2 years of breastfeeding experience.   LC discussed the [facility] services brochure, congratulated the parents, and exited the room.   Mom will call if she needs latch assistance.   Maternal Data Has patient been taught Hand Expression?: Yes Does the patient have breastfeeding experience prior to this delivery?: Yes How long did the patient breastfeed?: 2 years  Feeding Mother's Current Feeding Choice: Breast Milk    Interventions Interventions: LC Services brochure  Discharge Pump: Personal;Hands Free  Consult Status Consult Status: PRN    Orvil Feil Gissela Bloch 07/14/2021, 1:02 PM

## 2021-07-14 NOTE — Anesthesia Procedure Notes (Signed)
Spinal  Patient location during procedure: OR Start time: 07/14/2021 9:44 AM End time: 07/14/2021 9:48 AM Reason for block: surgical anesthesia Staffing Performed: anesthesiologist  Anesthesiologist: Val Eagle, MD Performed by: Val Eagle, MD Authorized by: Val Eagle, MD   Preanesthetic Checklist Completed: patient identified, IV checked, risks and benefits discussed, surgical consent, monitors and equipment checked, pre-op evaluation and timeout performed Spinal Block Patient position: sitting Prep: DuraPrep Patient monitoring: heart rate, cardiac monitor, continuous pulse ox and blood pressure Approach: midline Location: L4-5 Injection technique: single-shot Needle Needle type: Pencan  Needle gauge: 24 G Needle length: 9 cm Assessment Sensory level: T6 Events: CSF return

## 2021-07-14 NOTE — Op Note (Signed)
Operative Note   Patient: Kelly Villarreal  Date of Procedure: 07/14/2021  Procedure: Repeat Low Transverse Cesarean   Indications:  elective repeat - third CS  Pre-operative Diagnosis: RCS Undesired Fertility.   Post-operative Diagnosis: Same  TOLAC Candidate: No  Surgeon: Surgeon(s) and Role:    * Lesly Dukes, MD - Primary    * Warner Mccreedy, MD - Assisting  Assistants: Warner Mccreedy, MD  An experienced assistant was required given the standard of surgical care given the complexity of the case.  This assistant was needed for exposure, dissection, suctioning, retraction, instrument exchange, assisting with delivery with administration of fundal pressure, and for overall help during the procedure.   Anesthesia: spinal  Anesthesiologist: Dr. Maple Hudson  Antibiotics: Cefazolin   Estimated Blood Loss: 490 ml   Total IV Fluids: 2000 ml  Urine Output:  100 cc OF clear urine  Specimens: Placenta, to L&D   Complications: no complications   Indications: Kelly Villarreal is a 31 y.o. F6O1308 with an IUP [redacted]w[redacted]d presenting for scheduled cesarean secondary to the indications listed above. Clinical course notable for patient here for scheduled CS in setting of two prior cesarean sections.   Findings: Viable infant in cephalic presentation, no nuchal cord present. Apgars 9 , 9 , . Weight 3010 g . Clear amniotic fluid. Normal placenta, three vessel cord. Normal uterus, Normal bilateral fallopian tubes, Normal bilateral ovaries.  Procedure Details: A Time Out was held and the above information confirmed. The patient received intravenous antibiotics and had sequential compression devices applied to her lower extremities preoperatively. The patient was taken back to the operative suite where spinal anesthesia was administered. After induction of anesthesia, the patient was draped and prepped in the usual sterile manner and placed in a dorsal supine position with a leftward tilt. A low transverse skin  incision was made with scalpel and carried down through the subcutaneous tissue to the fascia. Fascial incision was made and extended transversely. The fascia was separated from the underlying rectus tissue superiorly and inferiorly. The rectus muscles were separated in the midline bluntly and the peritoneum was entered bluntly. An Alexis retractor was placed to aid in visualization of the uterus. The utero-vesical peritoneal reflection was incised transversely and the bladder flap was bluntly freed from the lower uterine segment. A low transverse uterine incision was made. The infant was successfully delivered from cephalic presentation, the umbilical cord was clamped after 1 minute. Cord ph was sent, and cord blood was obtained for evaluation. The placenta was removed Intact and appeared normal. The uterine incision was closed with running locked sutures of 0-Vicryl. Due to ongoing bleeding from the left side of hysterotomy a single interrupted stitch was done which led to excellent hemostasis..   Attention was then turned to the fallopian tubes. Filshie: The left Fallopian tube was identified by tracing out to the fimbriae. An avascular midsection of the tube approximately 3-4cm from the cornua was grasped with the babcock clamps and the filshie clip was applied, taking care to incorporate the entire tube.  Attention was then turned to the right fallopian tube after confirmation by tracing the tube out to the fimbriae. The same procedure was then performed on the right Fallopian tube, with excellent hemostasis was noted from both BTL sites.  The abdomen and the pelvis were cleared of all clot and debris and the Jon Gills was removed. Hemostasis was confirmed on all surfaces.  The peritoneum was reapproximated using 2-0 vicryl . The fascia was then closed  using 0 Vicryl in a running fashion. The subcutaneous layer was not reapproximated The skin was closed with a 4-0 vicryl subcuticular stitch. The patient  tolerated the procedure well. Sponge, lap, instrument and needle counts were correct x 2. She was taken to the recovery room in stable condition.  Disposition: PACU - hemodynamically stable.    Signed: Warner Mccreedy, MD, MPH [facility])

## 2021-07-15 LAB — CBC
HCT: 29.4 % — ABNORMAL LOW (ref 36.0–46.0)
Hemoglobin: 9.7 g/dL — ABNORMAL LOW (ref 12.0–15.0)
MCH: 28.4 pg (ref 26.0–34.0)
MCHC: 33 g/dL (ref 30.0–36.0)
MCV: 86.2 fL (ref 80.0–100.0)
Platelets: 195 10*3/uL (ref 150–400)
RBC: 3.41 MIL/uL — ABNORMAL LOW (ref 3.87–5.11)
RDW: 12.9 % (ref 11.5–15.5)
WBC: 11.8 10*3/uL — ABNORMAL HIGH (ref 4.0–10.5)
nRBC: 0 % (ref 0.0–0.2)

## 2021-07-15 LAB — BIRTH TISSUE RECOVERY COLLECTION (PLACENTA DONATION)

## 2021-07-15 MED ORDER — POLYSACCHARIDE IRON COMPLEX 150 MG PO CAPS
150.0000 mg | ORAL_CAPSULE | Freq: Every day | ORAL | Status: DC
  Administered 2021-07-15 – 2021-07-16 (×2): 150 mg via ORAL
  Filled 2021-07-15 (×2): qty 1

## 2021-07-15 MED ORDER — IBUPROFEN 600 MG PO TABS
ORAL_TABLET | ORAL | Status: AC
  Filled 2021-07-15: qty 1

## 2021-07-15 MED ORDER — IBUPROFEN 600 MG PO TABS
600.0000 mg | ORAL_TABLET | Freq: Four times a day (QID) | ORAL | Status: DC
  Administered 2021-07-15 – 2021-07-16 (×4): 600 mg via ORAL
  Filled 2021-07-15 (×5): qty 1

## 2021-07-15 NOTE — Social Work (Signed)
CSW received consult for hx of Anxiety and Depression.  CSW met with MOB to offer support and complete assessment.    CSW met with MOB at bedside and introduced CSW role. CSW observed MOB in bed and the infant asleep in the bassinet. MOB presented calm and receptive to [place] visit. CSW inquired how MOB has felt since giving birth. MOB reported feeling good with some pain. MOB reported that her pain is being managed with medications. MOB reported that she was "a nervous wreck" during the plan c-section because she could not feel her body and felt better after the procedure was over. CSW inquired about MOB history of anxiety and depression. 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 MOB reported that she was prescribed Zoloft to treat symptoms. MOB reported that she no longer takes Zoloft and has not had any concerns since then. MOB reported that she felt fine during the pregnancy with normal body changes and aches. MOB identified her husband, mom and brother as supports. CSW assessed MOB for safety. MOB denied thoughts of harm to self and others.   CSW discussed PPD symptoms. CSW provided education regarding the baby blues period vs. perinatal mood disorders, discussed treatment and gave resources for mental health follow up if concerns arise.  CSW recommended MOB complete a self-evaluation during the postpartum time period using the New Mom Checklist from Postpartum Progress and encouraged MOB to contact a medical professional if symptoms are noted at any time.    MOB reported she has all items for the infant including a bassinet where the infant will sleep. MOB has chosen [facility] for the infant's follow up care. CSW provided review of Sudden Infant Death Syndrome (SIDS) precautions. CSW assessed MOB for additional needs. MOB reported no further need.   CSW identifies no further need for intervention and no barriers to discharge  at this time.   Kathrin Greathouse, MSW, LCSW [facility] [place] Worker  [phone] 07/15/2021  2:18 PM

## 2021-07-15 NOTE — Progress Notes (Signed)
Postpartum Day 1 Subjective: Pt ambulating in room while breastfeeding, no complaints of pain ("just sore"). Passing flatus, no trouble urinating, still itchy from spinal.  Objective: Blood pressure 115/73, pulse (!) 58, temperature 97.8 F (36.6 C), temperature source Oral, resp. rate 17, height 5\' 6"  (1.676 m), weight 178 lb (80.7 kg), last menstrual period 10/28/2020, SpO2 100 %, currently breastfeeding.  Physical Exam:  General: alert, cooperative, appears stated age, and no distress Lochia: appropriate Uterine Fundus: firm Incision: healing well, no significant drainage DVT Evaluation: No evidence of DVT seen on physical exam.  Recent Labs    07/15/21 0502  HGB 9.7*  HCT 29.4*  Oral iron supplementation to start, pt amenable to plan.   Assessment/Plan: Planning for discharge tomorrow if pain still well controlled, breastfeeding well   LOS: 1 day   07/17/21, CNM, MSN, Edd Arbour Certified Nurse Midwife, [facility]

## 2021-07-16 ENCOUNTER — Ambulatory Visit: Payer: Self-pay

## 2021-07-16 LAB — RH IG WORKUP (INCLUDES ABO/RH)
Fetal Screen: NEGATIVE
Gestational Age(Wks): 37
Unit division: 0

## 2021-07-16 MED ORDER — IBUPROFEN 600 MG PO TABS: 600.0000 mg | ORAL_TABLET | Freq: Four times a day (QID) | ORAL | 0 refills | Status: DC | PRN

## 2021-07-16 MED ORDER — POLYSACCHARIDE IRON COMPLEX 150 MG PO CAPS: 150.0000 mg | ORAL_CAPSULE | ORAL | 3 refills | Status: DC

## 2021-07-16 MED ORDER — OXYCODONE HCL 5 MG PO TABS: 5.0000 mg | ORAL_TABLET | Freq: Four times a day (QID) | ORAL | 0 refills | Status: DC | PRN

## 2021-07-16 NOTE — Lactation Note (Signed)
This note was copied from a baby's chart. Lactation Consultation Note  Patient Name: Kelly Villarreal [id number] Date: 07/16/2021 Reason for consult: Follow-up assessment;Early term 37-38.6wks Age:32 hours   P2 mother whose infant is now 6 hours old.  This is an early term baby at 37+0 weeks weighing  >6 lbs.  Baby has a 9% weight loss.  Mother had been listed as a "prn" status.  However, with baby's weight loss I wanted to follow up.  Baby was crying when I arrived; mother stated she recently finished feeding.  Baby appeared hungry.  Suggested mother latch again and offered to assist; mother receptive.  Asked mother to remove baby's sleeper and feed STS.  Mother has been using the cradle hold.  Asked permission to try the cross cradle hold and mother agreed.  Assisted to latch easily and observed baby feeding for 22 minutes with intermittent stimulation.  Baby had a wide gape, flanged lips and mother denied pain; intermittent swallows heard.  Continued to review breast feeding basics during the feeding.  Discussed the weight loss with mother and the benefits of beginning to pump and supplement.  RN had brought in the pump, however, mother had not yet started pumping.  Suggested mother could pump and we could see how much volume she obtains.  Provided the supplementation guidelines for mother to follow.  #24 flange size is appropriate at this time.  After discussion, mother is willing to begin supplementation.  She prefers to use formula.  Reminded her that baby will always be supplemented with any breast milk she obtains prior to giving formula.  Mother verbalized understanding.  Updated RN and she will follow up with mother after pumping and begin formula supplementation.  Asked mother to call for the RN when she is finished pumping.    Grandmother present and assisting with care.     Maternal Data Has patient been taught Hand Expression?: Yes Does the patient have breastfeeding experience prior  to this delivery?: Yes How long did the patient breastfeed?: 2 years  Feeding Mother's Current Feeding Choice: Breast Milk and Formula  LATCH Score Latch: Grasps breast easily, tongue down, lips flanged, rhythmical sucking.  Audible Swallowing: Spontaneous and intermittent  Type of Nipple: Everted at rest and after stimulation  Comfort (Breast/Nipple): Soft / non-tender  Hold (Positioning): Assistance needed to correctly position infant at breast and maintain latch.  LATCH Score: 9   Lactation Tools Discussed/Used Tools: Pump;Flanges Flange Size: 24 Breast pump type: Double-Electric Breast Pump;Manual Pump Education: Setup, frequency, and cleaning;Milk Storage Reason for Pumping: Breast stimulation for supplementation; baby has a 9% weight loss (early term infant) Pumping frequency: Every three hours  Interventions Interventions: Breast feeding basics reviewed;Assisted with latch;Skin to skin;Breast massage;Hand express;Breast compression;Hand pump;Expressed milk;Position options;Support pillows;Adjust position;DEBP;Education  Discharge Pump: Hands Free  Consult Status Consult Status: Follow-up Date: 07/17/21 Follow-up type: In-patient    Dora Sims 07/16/2021, 5:32 PM

## 2021-07-17 ENCOUNTER — Ambulatory Visit: Payer: Self-pay

## 2021-07-17 NOTE — Lactation Note (Signed)
This note was copied from a baby's chart. Lactation Consultation Note  Patient Name: Kelly Villarreal Today's Date: 07/17/2021   Age:32 hours   LC visit attempted, but Mom was in shower. LC to return.   Lurline Hare [facility] 07/17/2021, 10:33 AM

## 2021-07-19 ENCOUNTER — Encounter: Payer: Self-pay | Admitting: Obstetrics & Gynecology

## 2021-07-21 ENCOUNTER — Encounter: Payer: Self-pay | Admitting: Obstetrics & Gynecology

## 2021-07-21 ENCOUNTER — Ambulatory Visit (INDEPENDENT_AMBULATORY_CARE_PROVIDER_SITE_OTHER): Payer: Medicaid Other | Admitting: Obstetrics & Gynecology

## 2021-07-21 VITALS — BP 125/88 | HR 81 | Wt 169.0 lb

## 2021-07-21 DIAGNOSIS — Z9889 Other specified postprocedural states: Secondary | ICD-10-CM

## 2021-07-21 DIAGNOSIS — Z98891 History of uterine scar from previous surgery: Secondary | ICD-10-CM

## 2021-07-21 NOTE — Progress Notes (Signed)
  HPI: Patient returns for routine postoperative follow-up having undergone repeat C section + tubal on 07/14/21.  The patient's immediate postoperative recovery has been unremarkable. Since hospital discharge the patient reports no problrms.   Current Outpatient Medications: ibuprofen (ADVIL) 600 MG tablet, Take 1 tablet (600 mg total) by mouth every 6 (six) hours as needed., Disp: 30 tablet, Rfl: 0 iron polysaccharides (NIFEREX) 150 MG capsule, Take 1 capsule (150 mg total) by mouth every other day., Disp: 30 capsule, Rfl: 3 Prenatal MV & Min w/FA-DHA (PRENATAL GUMMIES PO), Take 1 each by mouth daily., Disp: , Rfl:  oxyCODONE (OXY IR/ROXICODONE) 5 MG immediate release tablet, Take 1-2 tablets (5-10 mg total) by mouth every 6 (six) hours as needed for severe pain. (Patient not taking: Reported on 07/21/2021), Disp: 30 tablet, Rfl: 0 Saline (SIMPLY SALINE) 0.9 % AERS, Place 1 spray into the nose as needed (congestion). (Patient not taking: Reported on 07/21/2021), Disp: , Rfl:   No current facility-administered medications for this visit.    Blood pressure 125/88, pulse 81, weight 169 lb (76.7 kg), currently breastfeeding.  Physical Exam: Incision clean dry intact Abdomen is benign  Diagnostic Tests:   Pathology:   Impression + Management plan: No diagnosis found.    Medications Prescribed this encounter: No orders of the defined types were placed in this encounter.     Follow up: No follow-ups on file.    Lazaro Arms, MD Attending Physician for the Center for [facility] and [facility] 07/21/2021 9:47 AM

## 2021-08-12 ENCOUNTER — Ambulatory Visit: Payer: Medicaid Other | Admitting: Advanced Practice Midwife

## 2021-08-12 ENCOUNTER — Encounter: Payer: Self-pay | Admitting: Advanced Practice Midwife

## 2021-08-12 MED ORDER — IBUPROFEN 600 MG PO TABS: 600.0000 mg | ORAL_TABLET | Freq: Four times a day (QID) | ORAL | 1 refills | Status: DC | PRN

## 2021-08-12 NOTE — Progress Notes (Signed)
POSTPARTUM VISIT Patient name: Kelly Villarreal MRN [id number]  Date of birth: [birth year] Chief Complaint:   Postpartum Care  History of Present Illness:   Kelly Villarreal is a 31 y.o. G1P2102 African American female being seen today for a postpartum visit. She is 4 weeks postpartum following a repeat cesarean section, low transverse incision at 37.0 gestational weeks due to elective repeat with previous classical uterine incision. IOL: n/a. Anesthesia: spinal.  Laceration: n/a.  Complications: none. Inpatient contraception: yes , BTL .   Pregnancy complicated by Rh neg, hx PPROM at 24wk with neonatal demise; hx classical uterine incision . Tobacco use: no. Substance use disorder: no. Last pap smear: Jan 2021 and results were NILM w/ HRHPV negative. Next pap smear due: Jan 2024 No LMP recorded.  Postpartum course has been uncomplicated. Bleeding less flow than a normal period. Bowel function is  somewhat constipation/painful . Bladder function is normal. Urinary incontinence? no, fecal incontinence? no Patient is not sexually active. Last sexual activity: prior to birth of baby. Desired contraception: BTL done PP. Patient does not want a pregnancy in the future.  Desired family size is 2 children.   Upstream - 08/12/21 1114       Pregnancy Intention Screening   Does the patient want to become pregnant in the next year? No    Does the patient's partner want to become pregnant in the next year? No            The pregnancy intention screening data noted above was reviewed. Potential methods of contraception were discussed. The patient elected to proceed with No data recorded.  Edinburgh Postpartum Depression Screening: negative  Edinburgh Postnatal Depression Scale - 08/12/21 1111       Edinburgh Postnatal Depression Scale:  In the Past 7 Days   I have been able to laugh and see the funny side of things. 0    I have looked forward with enjoyment to things. 0    I have blamed myself  unnecessarily when things went wrong. 1    I have been anxious or worried for no good reason. 1    I have felt scared or panicky for no good reason. 1    Things have been getting on top of me. 1    I have been so unhappy that I have had difficulty sleeping. 0    I have felt sad or miserable. 0    I have been so unhappy that I have been crying. 0    The thought of harming myself has occurred to me. 0    Edinburgh Postnatal Depression Scale Total 4                05/04/2021    9:29 AM 02/02/2021    2:11 PM 03/23/2020   11:01 AM  GAD 7 : Generalized Anxiety Score  Nervous, Anxious, on Edge 0 0 0  Control/stop worrying 0 0 0  Worry too much - different things 0 0 1  Trouble relaxing 0 0 0  Restless 0 0 1  Easily annoyed or irritable 0 1 1  Afraid - awful might happen 0 1 0  Total GAD 7 Score 0 2 3     Baby's course has been uncomplicated. Baby is feeding by breast: milk supply adequate. Infant has a pediatrician/family doctor? Yes.  Childcare strategy if returning to work/school:  looking for employment soon .  Pt has material needs met for her and baby: Yes.  Review of Systems:   Pertinent items are noted in HPI Denies Abnormal vaginal discharge w/ itching/odor/irritation, headaches, visual changes, shortness of breath, chest pain, abdominal pain, severe nausea/vomiting, or problems with urination or bowel movements. Pertinent History Reviewed:  Reviewed past medical,surgical, obstetrical and family history.  Reviewed problem list, medications and allergies. OB History  Gravida Para Term Preterm AB Living  '3 3 2 1   2  ' SAB IAB Ectopic Multiple Live Births        0 3    # Outcome Date GA Lbr Len/2nd Weight Sex Delivery Anes PTL Lv  3 Term 07/14/21 [redacted]w[redacted]d 6 lb 10.2 oz (3.01 kg) F CS-LTranv Spinal  LIV  2 Term 10/20/18 [id number] lb 4.5 oz (2.85 kg) M CS-LTranv Spinal N LIV  1 Preterm 12/16/14 2463w5d lb 14.3 oz (0.86 kg) M CS-LTranv Gen  ND     Complications: History of  preterm premature rupture of membranes (PPROM), Placental abruption   Physical Assessment:   Vitals:   08/12/21 1101  BP: 126/85  Pulse: 78  Weight: 171 lb (77.6 kg)  Height: 5' 6.25" (1.683 m)  Body mass index is 27.39 kg/m.       Physical Examination:   General appearance: alert, well appearing, and in no distress  Mental status: alert, oriented to person, place, and time  Skin: warm & dry   Cardiovascular: normal heart rate noted   Respiratory: normal respiratory effort, no distress   Breasts: deferred, no complaints   Abdomen: soft, appropriately tender; incision healing, well-approximated; no s/s infection  Pelvic: examination not indicated. Thin prep pap obtained: No  Rectal: not examined  Extremities: Edema: none         No results found for this or any previous visit (from the past 24 hour(s)).  Assessment & Plan:  1) Postpartum exam 2) Four wks s/p repeat cesarean section, low transverse incision: still pulls/burns; tips given and ibuprofen refilled 3) breast feeding 4) Depression screening: neg 5) Contraception: s/p BTL 6) Constipation: rec stool softener/prn Miralax and given tips to decrease constipation  Essential components of care per ACOG recommendations:  1.  Mood and well being:  If positive depression screen, discussed and plan developed.  If using tobacco we discussed reduction/cessation and risk of relapse If current substance abuse, we discussed and referral to local resources was offered.   2. Infant care and feeding:  If breastfeeding, discussed returning to work, pumping, breastfeeding-associated pain, guidance regarding return to fertility while lactating if not using another method. If needed, patient was provided with a letter to be allowed to pump q 2-3hrs to support lactation in a private location with access to a refrigerator to store breastmilk.   Recommended that all caregivers be immunized for flu, pertussis and other preventable  communicable diseases If pt does not have material needs met for her/baby, referred to local resources for help obtaining these.  3. Sexuality, contraception and birth spacing Provided guidance regarding sexuality, management of dyspareunia, and resumption of intercourse Discussed avoiding interpregnancy interval <6mt70mand recommended birth spacing of 18 months  4. Sleep and fatigue Discussed coping options for fatigue and sleep disruption Encouraged family/partner/community support of 4 hrs of uninterrupted sleep to help with mood and fatigue  5. Physical recovery  If pt had a C/S, assessed incisional pain and providing guidance on normal vs prolonged recovery If pt had a laceration, perineal healing and pain reviewed.  If urinary or fecal incontinence, discussed management and  referred to PT or uro/gyn if indicated  Patient is safe to resume physical activity. Discussed attainment of healthy weight. No sex until 6wks PP.  6.  Chronic disease management Discussed pregnancy complications if any, and their implications for future childbearing and long-term maternal health. Review recommendations for prevention of recurrent pregnancy complications, such as 17 hydroxyprogesterone caproate to reduce risk for recurrent PTB not applicable, or aspirin to reduce risk of preeclampsia not applicable. Pt had GDM: no. If yes, 2hr GTT scheduled: not applicable. Reviewed medications and non-pregnant dosing including consideration of whether pt is breastfeeding using a reliable resource such as LactMed: yes Referred for f/u w/ PCP or subspecialist providers as indicated: not applicable  7. Health maintenance Mammogram at 32yo or earlier if indicated Pap smears as indicated  Meds:  Meds ordered this encounter  Medications   ibuprofen (ADVIL) 600 MG tablet    Sig: Take 1 tablet (600 mg total) by mouth every 6 (six) hours as needed.    Dispense:  30 tablet    Refill:  1    Order Specific Question:    Supervising Provider    Answer:   Florian Buff [[id number]]    Follow-up: Return for Jan 2024 Pap/physical.   No orders of the defined types were placed in this encounter.   Myrtis Ser CNM 08/12/2021 11:23 AM

## 2021-08-12 NOTE — Progress Notes (Signed)
Reports cont'd pain at incision. Hurts to cough or move certain ways. Pain is at "stiches" and "feels like they are coming apart."  Would like refill ibuprofen.

## 2021-08-12 NOTE — Progress Notes (Deleted)
    Post Partum Visit Note  Kelly Villarreal is a 31 y.o. G33P2102 female who presents for a postpartum visit. She is 4 weeks postpartum following a repeat cesarean section.  I have fully reviewed the prenatal and intrapartum course. The delivery was at 37 gestational weeks.  Anesthesia: spinal. Postpartum course has been uncomplicated. Baby is doing well. Baby is feeding by breast. Bleeding staining only. Bowel function is  reports pain . Bladder function is normal. Patient is not sexually active. Contraception method is tubal ligation. Postpartum depression screening: negative.   The pregnancy intention screening data noted above was reviewed. Potential methods of contraception were discussed. The patient elected to proceed with No data recorded.    Health Maintenance Due  Topic Date Due   COVID-19 Vaccine (1) Never done    {Common ambulatory SmartLinks:19316}  Review of Systems {ros; complete:30496}  Objective:  There were no vitals taken for this visit.   General:  {gen appearance:16600}   Breasts:  {desc; normal/abnormal/not indicated:14647}  Lungs: {lung exam:16931}  Heart:  {heart exam:5510}  Abdomen: {abdomen exam:16834}   Wound {Wound assessment:11097}  GU exam:  {desc; normal/abnormal/not indicated:14647}       Assessment:    There are no diagnoses linked to this encounter.  *** postpartum exam.   Plan:   Essential components of care per ACOG recommendations:  1.  Mood and well being: Patient with {gen negative/positive:315881} depression screening today. Reviewed local resources for support.  - Patient tobacco use? {tobacco use:25506}  - hx of drug use? {yes/no:25505}    2. Infant care and feeding:  -Patient currently breastmilk feeding? {yes/no:25502}  -Social determinants of health (SDOH) reviewed in EPIC. No concerns***The following needs were identified***  3. Sexuality, contraception and birth spacing - Patient {DOES_DOES YIF:02774} want a pregnancy in  the next year.  Desired family size is {NUMBER 1-10:22536} children.  - Reviewed reproductive life planning. Reviewed contraceptive methods based on pt preferences and effectiveness.  Patient desired {Upstream End Methods:24109} today.   - Discussed birth spacing of 18 months  4. Sleep and fatigue -Encouraged family/partner/community support of 4 hrs of uninterrupted sleep to help with mood and fatigue  5. Physical Recovery  - Discussed patients delivery and complications. She describes her labor as {description:25511} - Patient had a {CHL AMB [phone]}. Patient had a {laceration:25518} laceration. Perineal healing reviewed. Patient expressed understanding - Patient has urinary incontinence? {yes/no:25515} - Patient {ACTION; IS/IS JOI:[id number]} safe to resume physical and sexual activity  6.  Health Maintenance - HM due items addressed {Yes or If no, why not?:20788} - Last pap smear  Diagnosis  Date Value Ref Range Status  02/10/2019   Final   - Negative for intraepithelial lesion or malignancy (NILM)   Pap smear {done:10129} at today's visit.  -Breast Cancer screening indicated? {indicated:25516}  7. Chronic Disease/Pregnancy Condition follow up: {Follow up:25499}  - PCP follow up  Harrel Lemon, RN Center for [employer], [facility]

## 2022-02-06 ENCOUNTER — Ambulatory Visit: Payer: Medicaid Other | Admitting: Adult Health

## 2022-03-07 ENCOUNTER — Ambulatory Visit: Payer: Medicaid Other | Admitting: Obstetrics & Gynecology

## 2022-03-16 ENCOUNTER — Encounter: Payer: Self-pay | Admitting: Radiology

## 2022-03-22 ENCOUNTER — Ambulatory Visit (INDEPENDENT_AMBULATORY_CARE_PROVIDER_SITE_OTHER): Payer: Medicaid Other | Admitting: Obstetrics and Gynecology

## 2022-03-22 ENCOUNTER — Encounter: Payer: Self-pay | Admitting: Obstetrics and Gynecology

## 2022-03-22 ENCOUNTER — Other Ambulatory Visit (HOSPITAL_COMMUNITY)
Admission: RE | Admit: 2022-03-22 | Discharge: 2022-03-22 | Disposition: A | Discharge status: Home / Self Care | Payer: Medicaid Other | Source: Ambulatory Visit | Attending: Obstetrics & Gynecology | Admitting: Obstetrics & Gynecology

## 2022-03-22 VITALS — BP 134/81 | HR 77 | Ht 66.25 in | Wt 183.6 lb

## 2022-03-22 DIAGNOSIS — Z01419 Encounter for gynecological examination (general) (routine) without abnormal findings: Secondary | ICD-10-CM | POA: Insufficient documentation

## 2022-03-22 DIAGNOSIS — Z9851 Tubal ligation status: Secondary | ICD-10-CM | POA: Insufficient documentation

## 2022-03-22 NOTE — Progress Notes (Signed)
Kelly Villarreal is a 32 y.o. [phone] female here for a routine annual gynecologic exam.  Current complaints: none.   Denies abnormal vaginal bleeding, discharge, pelvic pain, problems with intercourse or other gynecologic concerns.    Gynecologic History No LMP recorded. Contraception: tubal ligation Last Pap: 2021. Results were: normal Last mammogram: NA.   Obstetric History OB History  Gravida Para Term Preterm AB Living  '3 3 2 1   2  '$ SAB IAB Ectopic Multiple Live Births        0 3    # Outcome Date GA Lbr Len/2nd Weight Sex Delivery Anes PTL Lv  3 Term 07/14/21 [redacted]w[redacted]d 6 lb 10.2 oz (3.01 kg) F CS-LTranv Spinal  LIV  2 Term 10/20/18 [id number] lb 4.5 oz (2.85 kg) M CS-LTranv Spinal N LIV  1 Preterm 12/16/14 2422w5d lb 14.3 oz (0.86 kg) M CS-LTranv Gen  ND     Complications: History of preterm premature rupture of membranes (PPROM), Placental abruption    Past Medical History:  Diagnosis Date   Allergy    seasonal   Anxiety 03/08/2011   Depression 03/08/2011   Grief reaction 05/31/2015   History of classical cesarean section 03/20/2018   2016: PPROM/abruption @ 24.5wks     2020: LTCS @ 37wks   History of preterm delivery 03/20/2018   24.5wks cramping>PPROM>low-lying placenta>abruption, neonatal demise        Nausea and vomiting during pregnancy 08/10/2014   Threatened miscarriage in early pregnancy 08/10/2014   Vaginal bleeding in pregnancy 08/10/2014    Past Surgical History:  Procedure Laterality Date   CESAREAN SECTION N/A 12/16/2014   Procedure: CESAREAN SECTION;  Surgeon: JamWoodroe ModeD;  Location: [facility];  Service: Obstetrics;  Laterality: N/A;   CESAREAN SECTION N/A 10/20/2018   Procedure: REPEAT CESAREAN SECTION;  Surgeon: PicAletha HalimD;  Location: [facility];  Service: Obstetrics;  Laterality: N/A;   CESAREAN SECTION WITH BILATERAL TUBAL LIGATION N/A 07/14/2021   Procedure: CESAREAN SECTION WITH BILATERAL TUBAL LIGATION;  Surgeon: LegGuss BundeD;   Location: [facility];  Service: Obstetrics;  Laterality: N/A;   MOUTH SURGERY      No current outpatient medications on file prior to visit.   No current facility-administered medications on file prior to visit.    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  Tobacco Use   Smoking status: Never   Smokeless tobacco: Never  Vaping Use   Vaping Use: Never used  Substance and Sexual Activity   Alcohol use: Not Currently    Comment: wine - occassionally- about 2 glasses, 3-4 x per week   Drug use: No   Sexual activity: Not Currently    Partners: Male    Birth control/protection: None, Surgical    Comment: BTL  Other Topics Concern   Not on file  Social History Narrative   Not on file   Social Determinants of Health   Financial Resource Strain: Low Risk  (03/22/2022)   Overall Financial Resource Strain (CARDIA)    Difficulty of Paying Living Expenses: Not very hard  Food Insecurity: No Food Insecurity (03/22/2022)   Hunger Vital Sign  Worried About Charity fundraiser in the Last Year: Never true    [place] in the Last Year: Never true  Transportation Needs: No Transportation Needs (03/22/2022)   PRAPARE - Hydrologist (Medical): No    Lack of Transportation (Non-Medical): No  Physical Activity: Insufficiently Active (03/22/2022)   Exercise Vital Sign    Days of Exercise per Week: 1 day    Minutes of Exercise per Session: 60 min  Stress: No Stress Concern Present (03/22/2022)   [place]    Feeling of Stress : Not at all  Social Connections: Moderately Isolated (03/22/2022)   Social Connection and Isolation Panel [NHANES]    Frequency  of Communication with Friends and Family: More than three times a week    Frequency of Social Gatherings with Friends and Family: Once a week    Attends Religious Services: Never    Marine scientist or Organizations: No    Attends Archivist Meetings: Never    Marital Status: Married  Human resources officer Violence: Not At Risk (03/22/2022)   Humiliation, Afraid, Rape, and Kick questionnaire    Fear of Current or Ex-Partner: No    Emotionally Abused: No    Physically Abused: No    Sexually Abused: No    Family History  Problem Relation Age of Onset   Colon cancer Mother    Hypertension Father    Heart disease Maternal Grandmother    Glaucoma Paternal Grandmother    Diabetes Maternal Grandfather    Hypertension Maternal Grandfather    Hypertension Paternal Aunt    Diabetes Paternal Aunt    Diabetes Paternal Uncle    Hypertension Paternal Uncle     The following portions of the patient's history were reviewed and updated as appropriate: allergies, current medications, past family history, past medical history, past social history, past surgical history and problem list.  Review of Systems Pertinent items noted in HPI and remainder of comprehensive ROS otherwise negative.   Objective:  BP 134/81 (BP Location: Right Arm, Patient Position: Sitting, Cuff Size: Normal)   Pulse 77   Ht 5' 6.25" (1.683 m)   Wt 183 lb 9.6 oz (83.3 kg)   Breastfeeding No   BMI 29.41 kg/m  Chaperone present CONSTITUTIONAL: Well-developed, well-nourished female in no acute distress.  HENT:  Normocephalic, atraumatic, External right and left ear normal. Oropharynx is clear and moist EYES: Conjunctivae and EOM are normal. Pupils are equal, round, and reactive to light. No scleral icterus.  NECK: Normal range of motion, supple, no masses.  Normal thyroid.  SKIN: Skin is warm and dry. No rash noted. Not diaphoretic. No erythema. No pallor. [place]: Alert and oriented to person, place, and  time. Normal reflexes, muscle tone coordination. No cranial nerve deficit noted. PSYCHIATRIC: Normal mood and affect. Normal behavior. Normal judgment and thought content. CARDIOVASCULAR: Normal heart rate noted, regular rhythm RESPIRATORY: Clear to auscultation bilaterally. Effort and breath sounds normal, no problems with respiration noted. BREASTS: Symmetric in size. No masses, skin changes, nipple drainage, or lymphadenopathy. ABDOMEN: Soft, normal bowel sounds, no distention noted.  No tenderness, rebound or guarding.  PELVIC: Normal appearing external genitalia; normal appearing vaginal mucosa and cervix.  No abnormal discharge noted.  Pap smear obtained.  Normal uterine size, no other palpable masses, no uterine or adnexal tenderness. MUSCULOSKELETAL: Normal range of motion. No tenderness.  No cyanosis, clubbing, or edema.  2+ distal  pulses.   Assessment:  Annual gynecologic examination with pap smear   Plan:  Will follow up results of pap smear and manage accordingly. Routine preventative health maintenance measures emphasized. Please refer to After Visit Summary for other counseling recommendations.    Chancy Milroy, MD, [place] Attending [place] for [facility], [place]

## 2022-03-27 LAB — CYTOLOGY - PAP
Adequacy: ABSENT
Chlamydia: NEGATIVE
Comment: NEGATIVE
Comment: NEGATIVE
Comment: NEGATIVE
Comment: NORMAL
Diagnosis: NEGATIVE
High risk HPV: NEGATIVE
Neisseria Gonorrhea: NEGATIVE
Trichomonas: NEGATIVE

## 2022-10-18 IMAGING — US US PELVIS COMPLETE WITH TRANSVAGINAL
1 series · 13 of 25 positions shown · non-contrast
Comparison: None

CLINICAL DATA: Intermittent lower abdominal pain

EXAM:
TRANSABDOMINAL AND TRANSVAGINAL ULTRASOUND OF PELVIS
TECHNIQUE: Both transabdominal and transvaginal ultrasound examinations of the
pelvis were performed. Transabdominal technique was performed for
global imaging of the pelvis including uterus, ovaries, adnexal
regions, and pelvic cul-de-sac. It was necessary to proceed with
endovaginal exam following the transabdominal exam to visualize the
ovaries and adnexa.

[Series 1: us pelvic complete with transvaginal · 13 of 126 slices shown]
[im 1/126]
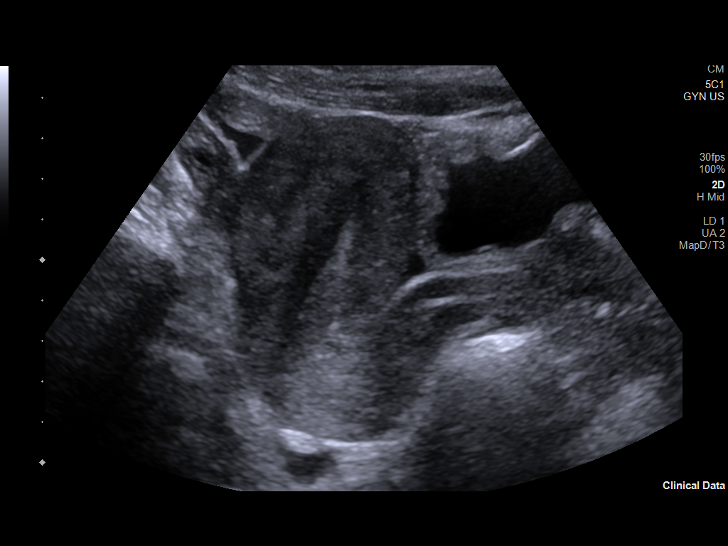
[im 11/126]
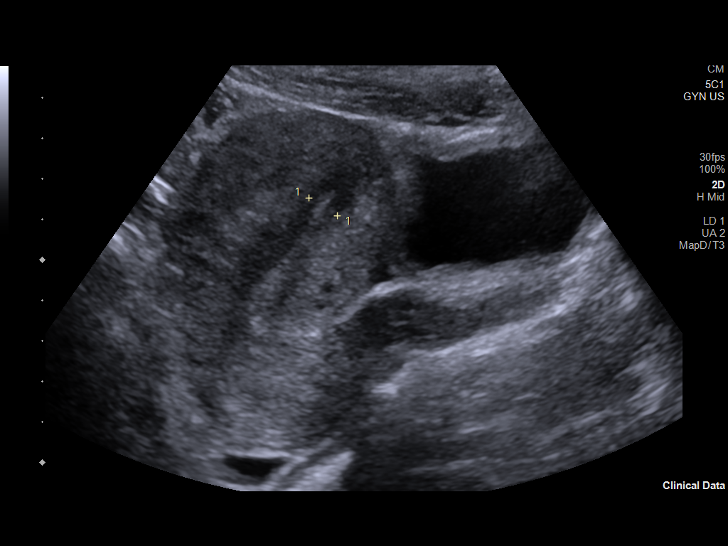
[im 21/126]
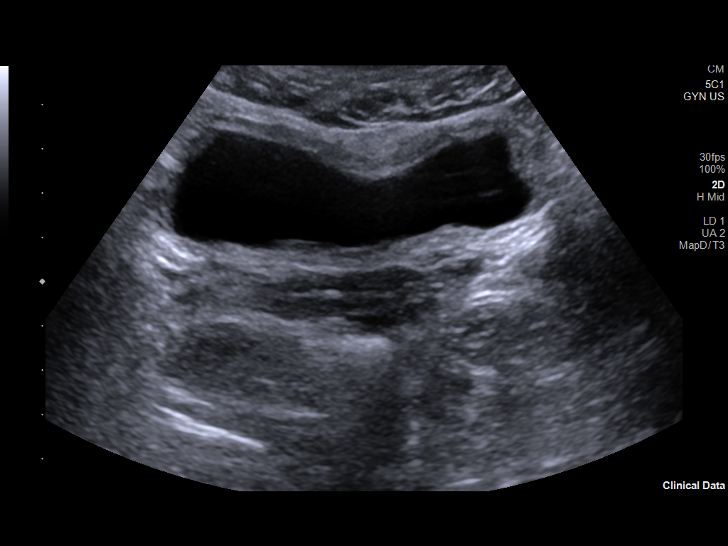
[im 32/126]
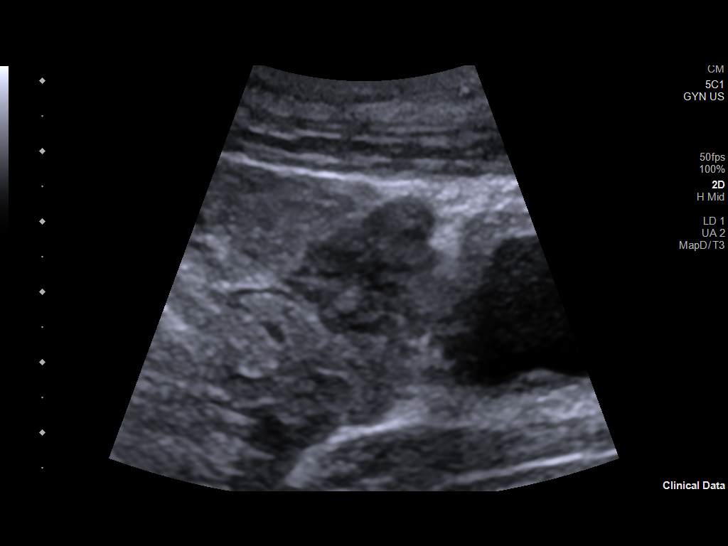
[im 42/126]
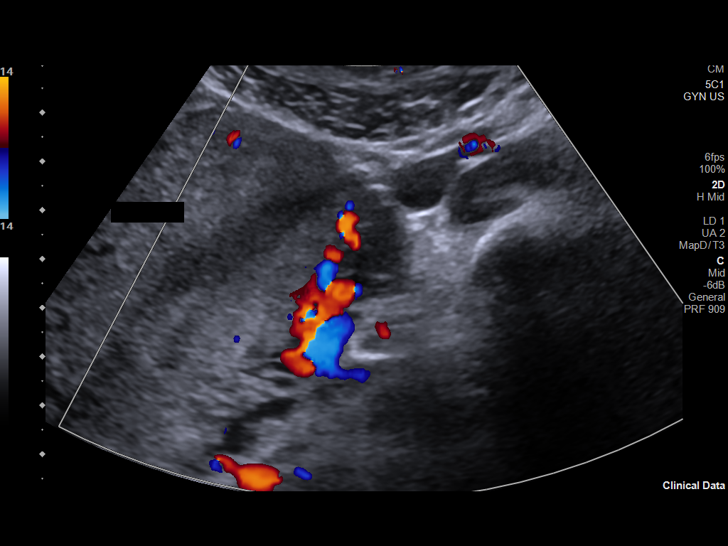
[im 53/126]
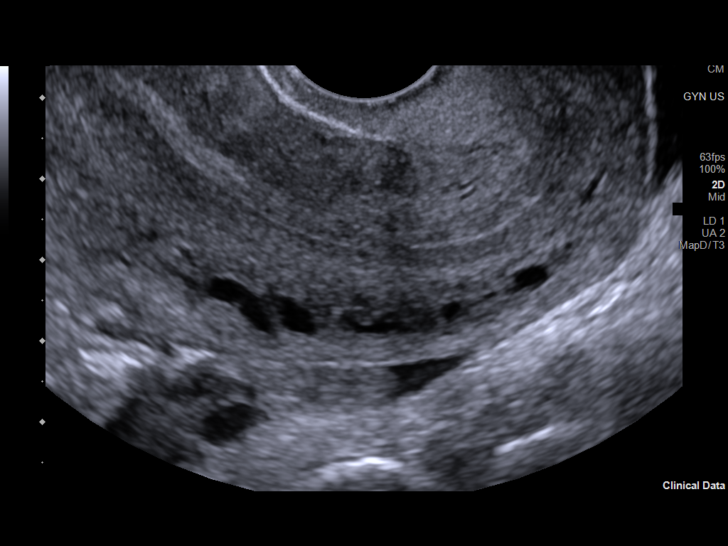
[im 63/126]
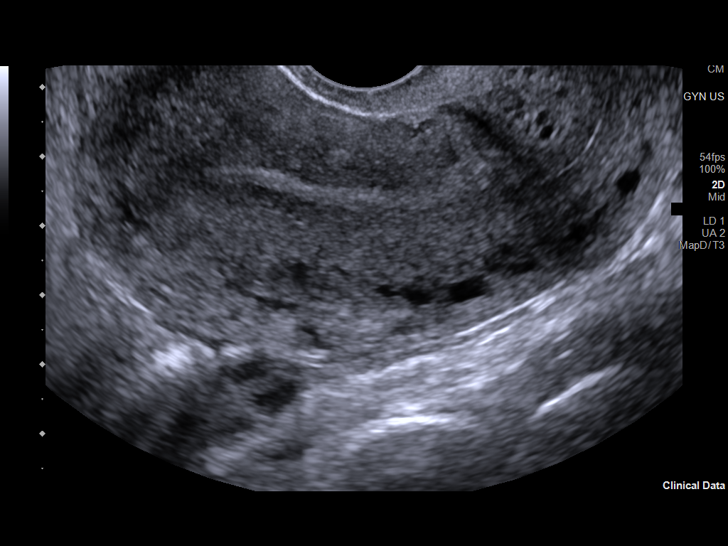
[im 73/126]
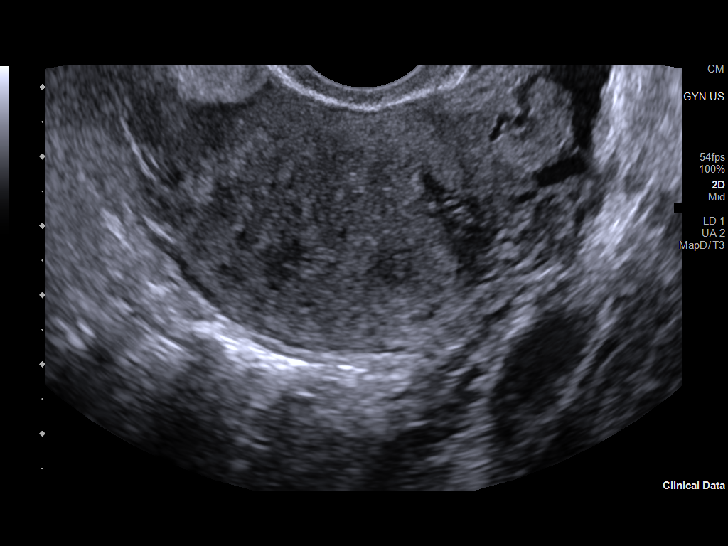
[im 84/126]
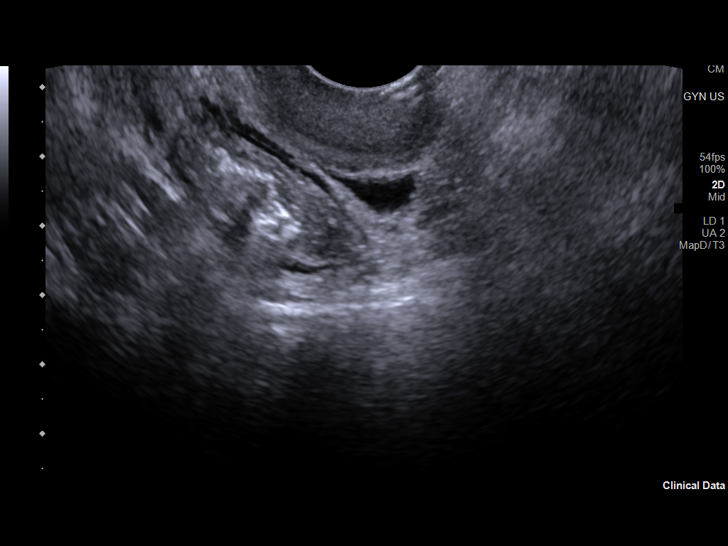
[im 94/126]
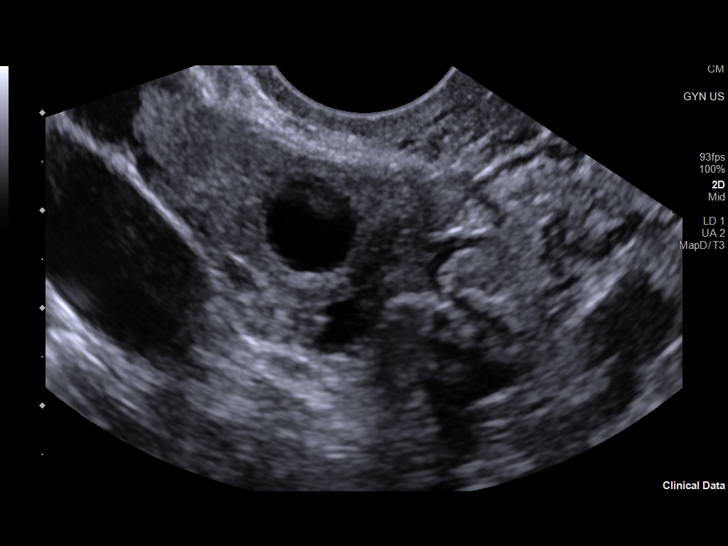
[im 105/126]
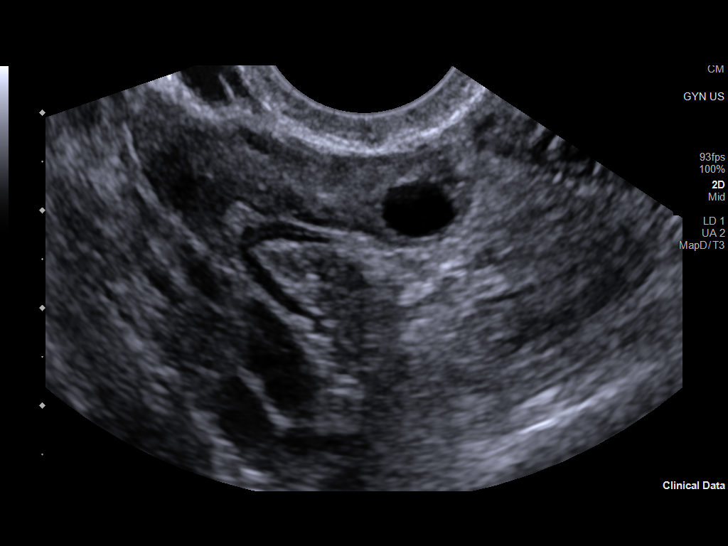
[im 115/126]
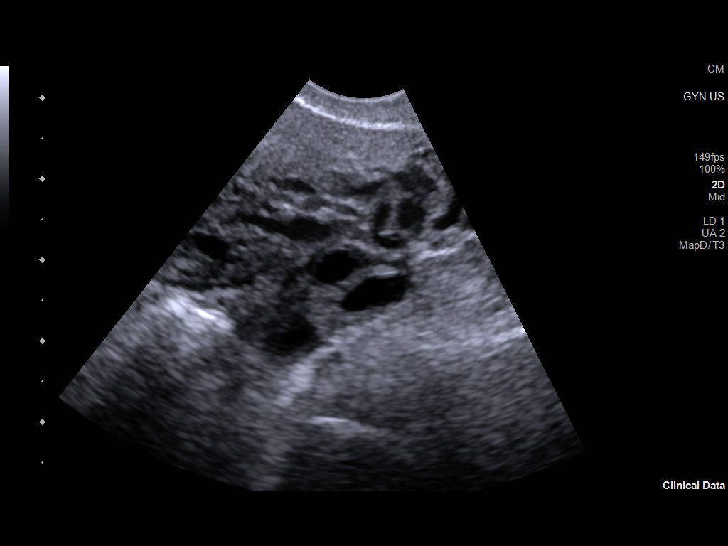
[im 126/126]
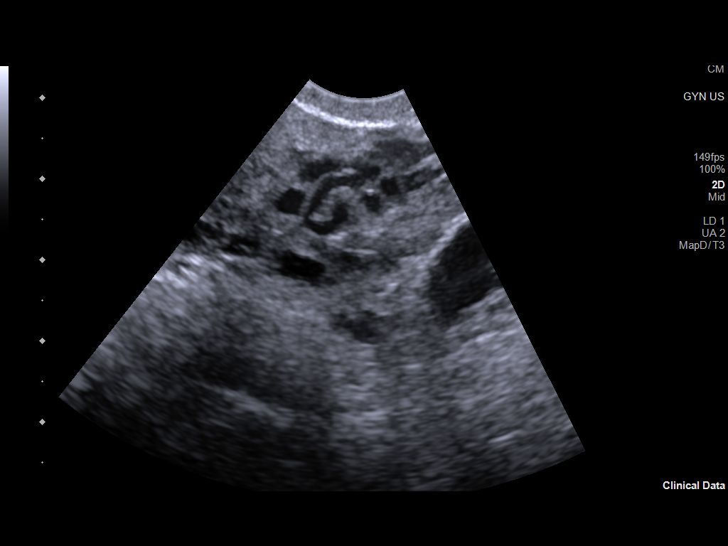

[13 of 25 positions shown; findings below may reference images not displayed]

FINDINGS: Uterus

Measurements: 8.8 x 3.7 x 5.0 cm = volume: 85 mL. No fibroids or
other mass visualized.

Endometrium

Thickness: 6 mm.  No focal abnormality visualized.

Right ovary

Measurements: 2.9 x 1.4 x 2.0 cm = volume: 4.3 mL. Normal
appearance/no adnexal mass.

Left ovary

Measurements: 2.4 x 1.4 x 2.9 cm = volume: 4.9 mL. Normal
appearance/no adnexal mass.

Other findings

Trace free pelvic fluid is likely physiologic. Mild diffuse
thickening of the bladder wall.
IMPRESSION: 1. No significant abnormality of the uterus or ovaries.
2. Diffuse bladder wall thickening likely due to under distension.
Chronic cystitis and obstruction can have a similar appearance.

## 2024-01-22 ENCOUNTER — Encounter (HOSPITAL_COMMUNITY): Payer: Self-pay

## 2024-01-22 ENCOUNTER — Other Ambulatory Visit: Payer: Self-pay

## 2024-01-22 ENCOUNTER — Emergency Department (HOSPITAL_COMMUNITY)
Admission: EM | Admit: 2024-01-22 | Discharge: 2024-01-23 | Disposition: A | Discharge status: Home / Self Care | Attending: Emergency Medicine | Admitting: Emergency Medicine

## 2024-01-22 DIAGNOSIS — R110 Nausea: Secondary | ICD-10-CM | POA: Insufficient documentation

## 2024-01-22 DIAGNOSIS — E876 Hypokalemia: Secondary | ICD-10-CM | POA: Diagnosis not present

## 2024-01-22 LAB — COMPREHENSIVE METABOLIC PANEL WITH GFR
ALT: 10 U/L (ref 0–44)
AST: 26 U/L (ref 15–41)
Albumin: 4.4 g/dL (ref 3.5–5.0)
Alkaline Phosphatase: 70 U/L (ref 38–126)
Anion gap: 10 (ref 5–15)
BUN: 5 mg/dL — ABNORMAL LOW (ref 6–20)
CO2: 29 mmol/L (ref 22–32)
Calcium: 9.3 mg/dL (ref 8.9–10.3)
Chloride: 100 mmol/L (ref 98–111)
Creatinine, Ser: 0.71 mg/dL (ref 0.44–1.00)
GFR, Estimated: >60 mL/min
Glucose, Bld: 95 mg/dL (ref 70–99)
Potassium: 3.4 mmol/L — ABNORMAL LOW (ref 3.5–5.1)
Sodium: 139 mmol/L (ref 135–145)
Total Bilirubin: 0.6 mg/dL (ref 0.0–1.2)
Total Protein: 7.5 g/dL (ref 6.5–8.1)

## 2024-01-22 LAB — CBC
HCT: 42 % (ref 36.0–46.0)
Hemoglobin: 13.8 g/dL (ref 12.0–15.0)
MCH: 30.2 pg (ref 26.0–34.0)
MCHC: 32.9 g/dL (ref 30.0–36.0)
MCV: 91.9 fL (ref 80.0–100.0)
Platelets: 275 K/uL (ref 150–400)
RBC: 4.57 MIL/uL (ref 3.87–5.11)
RDW: 13.3 % (ref 11.5–15.5)
WBC: 5.6 K/uL (ref 4.0–10.5)
nRBC: 0 % (ref 0.0–0.2)

## 2024-01-22 LAB — URINALYSIS, ROUTINE W REFLEX MICROSCOPIC
Bilirubin Urine: NEGATIVE
Glucose, UA: NEGATIVE mg/dL
Ketones, ur: 20 mg/dL — AB
Leukocytes,Ua: NEGATIVE
Nitrite: NEGATIVE
Protein, ur: NEGATIVE mg/dL
Specific Gravity, Urine: 1.009 (ref 1.005–1.030)
pH: 6 (ref 5.0–8.0)

## 2024-01-22 LAB — LIPASE, BLOOD: Lipase: 15 U/L (ref 11–51)

## 2024-01-22 LAB — POC URINE PREG, ED: Preg Test, Ur: NEGATIVE

## 2024-01-22 NOTE — ED Triage Notes (Signed)
 Pt c/o N/V/D since Thursday. Pt states she feels weak, not eating, dizzy, and feels dehydrated.

## 2024-01-23 MED ORDER — ONDANSETRON 4 MG PO TBDP
4.0000 mg | ORAL_TABLET | Freq: Once | ORAL | Status: AC
  Administered 2024-01-23: 4 mg via ORAL
  Filled 2024-01-23: qty 1

## 2024-01-23 MED ORDER — ONDANSETRON 4 MG PO TBDP: 4.0000 mg | ORAL_TABLET | Freq: Three times a day (TID) | ORAL | 0 refills | Status: AC | PRN

## 2024-01-23 NOTE — ED Notes (Signed)
 Pt has drank bottle of water while in [place]- says she is tolerating well. Just has no appetite.

## 2024-01-23 NOTE — ED Provider Notes (Signed)
 "  [place] EMERGENCY DEPARTMENT AT [facility]  Provider Note  CSN: [id number] Arrival date & time: 01/22/24 2023  History Chief Complaint  Patient presents with   Nausea    Kelly Villarreal is a 34 y.o. female reports N/V/D started 4-5 days ago after drinking some alcohol and persisted for 2 days after that. Has not had an episode of either in 2 days. She reports some persistent nausea without vomiting and decreased appetite. No fever. No bleeding. Has been able to keep down liquids and some soup.    Home Medications Prior to Admission medications  Medication Sig Start Date End Date Taking? Authorizing Provider  ondansetron  (ZOFRAN -ODT) 4 MG disintegrating tablet Take 1 tablet (4 mg total) by mouth every 8 (eight) hours as needed for nausea or vomiting. 01/23/24  Yes Roselyn Carlin NOVAK, MD     Allergies    Patient has no known allergies.   Review of Systems   Review of Systems Please see HPI for pertinent positives and negatives  Physical Exam BP 137/86   Pulse 70   Temp 98.4 F (36.9 C) (Oral)   Resp 18   SpO2 100%   Physical Exam Vitals and nursing note reviewed.  Constitutional:      Appearance: Normal appearance.  HENT:     Head: Normocephalic and atraumatic.     Nose: Nose normal.     Mouth/Throat:     Mouth: Mucous membranes are moist.  Eyes:     Extraocular Movements: Extraocular movements intact.     Conjunctiva/sclera: Conjunctivae normal.  Cardiovascular:     Rate and Rhythm: Normal rate.  Pulmonary:     Effort: Pulmonary effort is normal.     Breath sounds: Normal breath sounds.  Abdominal:     General: Abdomen is flat.     Palpations: Abdomen is soft.     Tenderness: There is no abdominal tenderness.  Musculoskeletal:        General: No swelling. Normal range of motion.     Cervical back: Neck supple.  Skin:    General: Skin is warm and dry.  Neurological:     General: No focal deficit present.     Mental Status: She is alert.   Psychiatric:        Mood and Affect: Mood normal.     ED Results / Procedures / Treatments   EKG None  Procedures Procedures  Medications Ordered in the ED Medications  ondansetron  (ZOFRAN -ODT) disintegrating tablet 4 mg (4 mg Oral Given 01/23/24 0106)    Initial Impression and Plan  Patient here with N/V/D mostly resolved but still some lingering nausea. Her exam is benign. Labs are reassuring including CBC, CMP, Lipase and UA. She has been drinking water here without vomiting. Will give zofran  and recommend she advance diet as tolerated, focusing on hydration initially. PCP follow up, RTED for any other concerns.    ED Course       MDM Rules/Calculators/A&P Medical Decision Making Problems Addressed: Nausea: acute illness or injury  Amount and/or Complexity of Data Reviewed Labs: ordered. Decision-making details documented in ED Course.  Risk Prescription drug management.     Final Clinical Impression(s) / ED Diagnoses Final diagnoses:  Nausea    Rx / DC Orders ED Discharge Orders          Ordered    ondansetron  (ZOFRAN -ODT) 4 MG disintegrating tablet  Every 8 hours PRN        01/23/24 0107  Roselyn Carlin NOVAK, MD 01/23/24 0107  "

## 2024-02-05 ENCOUNTER — Encounter: Payer: Self-pay | Admitting: Internal Medicine
